# Patient Record
Sex: Male | Born: 1950 | Race: White | Hispanic: No | Marital: Married | Smoking: Never smoker
Health system: Southern US, Community
[De-identification: ages and names within clinical notes are randomized; demographics above are authoritative.]

## PROBLEM LIST (undated history)

## (undated) DIAGNOSIS — I4719 Other supraventricular tachycardia: Secondary | ICD-10-CM

## (undated) DIAGNOSIS — I447 Left bundle-branch block, unspecified: Secondary | ICD-10-CM

## (undated) DIAGNOSIS — E785 Hyperlipidemia, unspecified: Secondary | ICD-10-CM

## (undated) DIAGNOSIS — K5790 Diverticulosis of intestine, part unspecified, without perforation or abscess without bleeding: Secondary | ICD-10-CM

## (undated) DIAGNOSIS — F32A Depression, unspecified: Secondary | ICD-10-CM

## (undated) DIAGNOSIS — I2510 Atherosclerotic heart disease of native coronary artery without angina pectoris: Secondary | ICD-10-CM

## (undated) DIAGNOSIS — G629 Polyneuropathy, unspecified: Secondary | ICD-10-CM

## (undated) DIAGNOSIS — M25559 Pain in unspecified hip: Secondary | ICD-10-CM

## (undated) DIAGNOSIS — C4491 Basal cell carcinoma of skin, unspecified: Secondary | ICD-10-CM

## (undated) DIAGNOSIS — M1990 Unspecified osteoarthritis, unspecified site: Secondary | ICD-10-CM

## (undated) DIAGNOSIS — I10 Essential (primary) hypertension: Secondary | ICD-10-CM

## (undated) DIAGNOSIS — I219 Acute myocardial infarction, unspecified: Secondary | ICD-10-CM

## (undated) DIAGNOSIS — G4730 Sleep apnea, unspecified: Secondary | ICD-10-CM

## (undated) HISTORY — DX: Hyperlipidemia, unspecified: E78.5

## (undated) HISTORY — DX: Other supraventricular tachycardia: I47.19

## (undated) HISTORY — DX: Diverticulosis of intestine, part unspecified, without perforation or abscess without bleeding: K57.90

## (undated) HISTORY — DX: Basal cell carcinoma of skin, unspecified: C44.91

## (undated) HISTORY — DX: Pain in unspecified hip: M25.559

## (undated) HISTORY — PX: CHOLECYSTECTOMY: SHX55

## (undated) HISTORY — DX: Atherosclerotic heart disease of native coronary artery without angina pectoris: I25.10

## (undated) HISTORY — DX: Polyneuropathy, unspecified: G62.9

## (undated) HISTORY — PX: SLIPPED CAPITAL FEMORAL EPIPHYSIS PINNING: SHX391

## (undated) HISTORY — PX: OTHER SURGICAL HISTORY: SHX169

---

## 2002-04-22 ENCOUNTER — Encounter: Payer: Self-pay | Admitting: Emergency Medicine

## 2002-04-22 ENCOUNTER — Emergency Department (HOSPITAL_COMMUNITY): Admission: EM | Admit: 2002-04-22 | Discharge: 2002-04-22 | Payer: Self-pay | Admitting: Emergency Medicine

## 2002-04-27 ENCOUNTER — Encounter (INDEPENDENT_AMBULATORY_CARE_PROVIDER_SITE_OTHER): Payer: Self-pay | Admitting: *Deleted

## 2002-04-27 ENCOUNTER — Observation Stay (HOSPITAL_COMMUNITY): Admission: RE | Admit: 2002-04-27 | Discharge: 2002-04-28 | Payer: Self-pay | Admitting: *Deleted

## 2009-09-14 DIAGNOSIS — I2510 Atherosclerotic heart disease of native coronary artery without angina pectoris: Secondary | ICD-10-CM

## 2009-09-14 HISTORY — DX: Atherosclerotic heart disease of native coronary artery without angina pectoris: I25.10

## 2009-09-14 HISTORY — PX: CARDIAC CATHETERIZATION: SHX172

## 2009-09-27 ENCOUNTER — Ambulatory Visit: Payer: Self-pay | Admitting: Internal Medicine

## 2009-09-27 ENCOUNTER — Inpatient Hospital Stay (HOSPITAL_COMMUNITY): Admission: EM | Admit: 2009-09-27 | Discharge: 2009-09-28 | Payer: Self-pay | Admitting: Emergency Medicine

## 2009-10-06 ENCOUNTER — Ambulatory Visit (HOSPITAL_COMMUNITY): Admission: RE | Admit: 2009-10-06 | Discharge: 2009-10-07 | Payer: Self-pay | Admitting: Interventional Cardiology

## 2009-10-15 ENCOUNTER — Encounter (HOSPITAL_COMMUNITY)
Admission: RE | Admit: 2009-10-15 | Discharge: 2010-01-13 | Payer: Self-pay | Source: Home / Self Care | Attending: Interventional Cardiology | Admitting: Interventional Cardiology

## 2010-02-04 ENCOUNTER — Encounter: Payer: Self-pay | Admitting: Family Medicine

## 2010-03-29 LAB — CBC
HCT: 38.2 % — ABNORMAL LOW (ref 39.0–52.0)
Hemoglobin: 14.6 g/dL (ref 13.0–17.0)
MCHC: 34.7 g/dL (ref 30.0–36.0)
MCHC: 34.8 g/dL (ref 30.0–36.0)
Platelets: 200 10*3/uL (ref 150–400)
Platelets: 215 10*3/uL (ref 150–400)
RBC: 4.69 MIL/uL (ref 4.22–5.81)
RDW: 12.4 % (ref 11.5–15.5)
RDW: 12.7 % (ref 11.5–15.5)
WBC: 7.6 10*3/uL (ref 4.0–10.5)
WBC: 9 10*3/uL (ref 4.0–10.5)
WBC: 7.5 10*3/uL (ref 4.0–10.5)

## 2010-03-29 LAB — CARDIAC PANEL(CRET KIN+CKTOT+MB+TROPI)
CK, MB: 51.2 ng/mL (ref 0.3–4.0)
Total CK: 449 U/L — ABNORMAL HIGH (ref 7–232)
Total CK: 454 U/L — ABNORMAL HIGH (ref 7–232)
Troponin I: 7.78 ng/mL (ref 0.00–0.06)
Troponin I: 3.65 ng/mL (ref 0.00–0.06)
Troponin I: 7.75 ng/mL (ref 0.00–0.06)

## 2010-03-29 LAB — LIPID PANEL
Cholesterol: 197 mg/dL (ref 0–200)
HDL: 42 mg/dL (ref >39)
LDL Cholesterol: 99 mg/dL (ref 0–99)
Total CHOL/HDL Ratio: 4.7 RATIO
VLDL: 56 mg/dL — ABNORMAL HIGH (ref 0–40)

## 2010-03-29 LAB — POCT CARDIAC MARKERS
CKMB, poc: 4 ng/mL (ref 1.0–8.0)
Myoglobin, poc: 70.8 ng/mL (ref 12–200)
Troponin i, poc: 0.17 ng/mL — ABNORMAL HIGH (ref 0.00–0.09)

## 2010-03-29 LAB — BASIC METABOLIC PANEL
BUN: 10 mg/dL (ref 6–23)
BUN: 13 mg/dL (ref 6–23)
CO2: 26 mEq/L (ref 19–32)
Calcium: 8.8 mg/dL (ref 8.4–10.5)
Chloride: 106 mEq/L (ref 96–112)
Creatinine, Ser: 0.81 mg/dL (ref 0.4–1.5)
GFR calc non Af Amer: >60 mL/min (ref >60)
Glucose, Bld: 105 mg/dL — ABNORMAL HIGH (ref 70–99)
Glucose, Bld: 111 mg/dL — ABNORMAL HIGH (ref 70–99)
Potassium: 3.7 mEq/L (ref 3.5–5.1)
Potassium: 4.1 mEq/L (ref 3.5–5.1)
Sodium: 137 mEq/L (ref 135–145)

## 2010-03-29 LAB — POCT I-STAT, CHEM 8
BUN: 20 mg/dL (ref 6–23)
Chloride: 105 mEq/L (ref 96–112)
Creatinine, Ser: 0.8 mg/dL (ref 0.4–1.5)
Hemoglobin: 15 g/dL (ref 13.0–17.0)
Potassium: 3.9 mEq/L (ref 3.5–5.1)
Sodium: 139 mEq/L (ref 135–145)

## 2010-03-29 LAB — DIFFERENTIAL
Lymphocytes Relative: 46 % (ref 12–46)
Lymphs Abs: 3.4 10*3/uL (ref 0.7–4.0)
Monocytes Relative: 10 % (ref 3–12)
Neutro Abs: 3.1 10*3/uL (ref 1.7–7.7)
Neutrophils Relative %: 42 % — ABNORMAL LOW (ref 43–77)

## 2010-03-29 LAB — MRSA PCR SCREENING: MRSA by PCR: NEGATIVE

## 2010-03-29 LAB — HEMOGLOBIN A1C: Hgb A1c MFr Bld: 5.6 % (ref <5.7)

## 2010-06-01 NOTE — Op Note (Signed)
   NAME:  Wayne [name], Wayne [name]                            ACCOUNT NO.:  [id number]   MEDICAL RECORD NO.:  [id number]                   PATIENT TYPE:  OBV   LOCATION:  0356                                 FACILITY:  [facility]   PHYSICIAN:  Vikki Ports, M.D.         DATE OF BIRTH:  [birth year]   DATE OF PROCEDURE:  04/27/2002  DATE OF DISCHARGE:                                 OPERATIVE REPORT   PREOPERATIVE DIAGNOSIS:  Symptomatic cholelithiasis.   POSTOPERATIVE DIAGNOSIS:  Symptomatic cholelithiasis.   PROCEDURE:  Laparoscopic cholecystectomy.   SURGEON:  Vikki Ports, M.D.   ASSISTANT:  Anselm Pancoast. Zachery Dakins, M.D.   ANESTHESIA:  General.   DESCRIPTION OF PROCEDURE:  The patient was taken to the operating room and  placed in the supine position.  After adequate general anesthesia was  induced using endotracheal tube, the abdomen was prepped and draped in  normal sterile fashion.   Using a transverse infraumbilical incision, I dissected down to the fascia.  The fascia was opened vertically.  An 0 Vicryl pursestring suture was placed  around the fascial defect and Hasson trocar was placed in the abdomen.  The  abdomen was insufflated with continuous flow carbon dioxide.  Under direct  visualization a 7 mm port was placed in the subxiphoid region.  Two 5 mm  ports were placed in the right abdomen.  The gallbladder was somewhat  edematous and retracted superiorly.  Dissection of the infundibulum easily  identified the cystic duct, and this junction with the gallbladder.  An  adequate window was created posterior to this, giving excellent  visualization of cystic artery and duct.  They were both triply clipped and  divided.  The gallbladder was taken off the gallbladder bed using Bovie  electrocautery, and removed through the umbilical port.  Adequate hemostasis  was ensured.  Pneumoperitoneum was released.  The infraumbilical fascial  defect was closed with an 0 Vicryl  pursestring suture.  Skin incisions were  closed with subcuticular 4-0 Monocryl.  Steri-Strips and sterile dressings  were applied.  The patient tolerated the procedure well and went to PACU in  good condition.                                               Vikki Ports, M.D.    KRH/MEDQ  D:  04/27/2002  T:  04/27/2002  Job:  [id number]

## 2010-09-10 ENCOUNTER — Other Ambulatory Visit: Payer: Self-pay | Admitting: Dermatology

## 2012-05-28 ENCOUNTER — Other Ambulatory Visit: Payer: Self-pay | Admitting: Physical Medicine and Rehabilitation

## 2012-05-28 DIAGNOSIS — M545 Low back pain, unspecified: Secondary | ICD-10-CM

## 2012-06-03 ENCOUNTER — Other Ambulatory Visit: Payer: Self-pay

## 2012-11-18 ENCOUNTER — Encounter: Payer: Self-pay | Admitting: Interventional Cardiology

## 2013-01-13 ENCOUNTER — Other Ambulatory Visit: Payer: Self-pay | Admitting: Interventional Cardiology

## 2013-02-02 ENCOUNTER — Encounter: Payer: Self-pay | Admitting: Cardiology

## 2013-02-02 DIAGNOSIS — E782 Mixed hyperlipidemia: Secondary | ICD-10-CM | POA: Insufficient documentation

## 2013-02-02 DIAGNOSIS — I2510 Atherosclerotic heart disease of native coronary artery without angina pectoris: Secondary | ICD-10-CM | POA: Insufficient documentation

## 2013-02-02 DIAGNOSIS — I10 Essential (primary) hypertension: Secondary | ICD-10-CM

## 2013-02-02 DIAGNOSIS — G609 Hereditary and idiopathic neuropathy, unspecified: Secondary | ICD-10-CM | POA: Insufficient documentation

## 2013-02-02 DIAGNOSIS — I252 Old myocardial infarction: Secondary | ICD-10-CM | POA: Insufficient documentation

## 2013-02-15 ENCOUNTER — Encounter: Payer: Self-pay | Admitting: Interventional Cardiology

## 2013-02-15 ENCOUNTER — Ambulatory Visit (INDEPENDENT_AMBULATORY_CARE_PROVIDER_SITE_OTHER): Payer: BC Managed Care – PPO | Admitting: Interventional Cardiology

## 2013-02-15 VITALS — BP 114/74 | HR 60 | Ht 71.0 in | Wt 210.0 lb

## 2013-02-15 DIAGNOSIS — I2510 Atherosclerotic heart disease of native coronary artery without angina pectoris: Secondary | ICD-10-CM

## 2013-02-15 DIAGNOSIS — E782 Mixed hyperlipidemia: Secondary | ICD-10-CM

## 2013-02-15 DIAGNOSIS — I252 Old myocardial infarction: Secondary | ICD-10-CM

## 2013-02-15 DIAGNOSIS — I10 Essential (primary) hypertension: Secondary | ICD-10-CM

## 2013-02-15 NOTE — Patient Instructions (Signed)
Your physician recommends that you return for a FASTING lipid profile on 02/22/13.   Your physician recommends that you continue on your current medications as directed. Please refer to the Current Medication list given to you today.  Your physician wants you to follow-up in: 1 year follow up with Dr. Irish Lack. You will receive a reminder letter in the mail two months in advance. If you don't receive a letter, please call our office to schedule the follow-up appointment.

## 2013-02-15 NOTE — Progress Notes (Signed)
Patient ID: Wayne [name], male   DOB: [birth year], 63 y.o.   MRN: [id number]    [place], [name], [place]  [address] Phone: [phone] Fax:  [phone]  Date:  02/15/2013   ID:  Wayne [name], DOB September 19, 1950, MRN [id number]  PCP:  Simona Huh, MD      History of Present Illness: Wayne [name] is a 63 y.o. male who has multivessel CAD. He had stents in 2011. Infrequent chest pain like his prior angina/MI. There is typically back pain involved. He has gained some weight due to being unable to exercise. No exertional sx. Rides exercise bike without any chest pain. No significant chest pressure. Off CPAP for OSA, unable to tolerate. CAD/ASCVD:  c/o Chest pain shortlived; starts in his back and moves to the chest. Not related to exertion.. Chronic back pain. Denies : Dizziness.  Leg edema.  Nitroglycerin.  Orthopnea.  Palpitations.  Syncope.     Wt Readings from Last 3 Encounters:  02/15/13 210 lb (95.255 kg)     Past Medical History  Diagnosis Date  . Basal cell carcinoma     MOHS  . Hyperlipidemia   . Hip pain     CHRONIC  . Neuropathy     IDIOPATHIC  . Coronary artery disease 9/11    S/P MI and PTCA--X 4 STENTS  . Diverticulosis     Current Outpatient Prescriptions  Medication Sig Dispense Refill  . aspirin 325 MG tablet Take 325 mg by mouth daily.      . clopidogrel (PLAVIX) 75 MG tablet Take 75 mg by mouth daily with breakfast.      . DULoxetine (CYMBALTA) 60 MG capsule Take 60 mg by mouth daily.      [name] gabapentin (NEURONTIN) 600 MG tablet Take 600 mg by mouth 3 (three) times daily.      [name] lisinopril (PRINIVIL,ZESTRIL) 5 MG tablet Take 5 mg by mouth daily.      . metoprolol succinate (TOPROL-XL) 25 MG 24 hr tablet TAKE 1/2 TABLET BY MOUTY TWICE A DAY  30 tablet  3  . nitroGLYCERIN (NITROSTAT) 0.4 MG SL tablet Place 0.4 mg under the tongue every 5 (five) minutes as needed for chest pain.      [name] oxyCODONE-acetaminophen (PERCOCET) 10-325 MG per  tablet Take 1 tablet by mouth every 4 (four) hours as needed for pain.      . simvastatin (ZOCOR) 20 MG tablet Take 20 mg by mouth daily.       No current facility-administered medications for this visit.    Allergies:    Allergies  Allergen Reactions  . Mirapex [Pramipexole Dihydrochloride]     HA  . Trazodone And Nefazodone     DROWSY    Social History:  The patient  reports that he has never smoked. He does not have any smokeless tobacco history on file. He reports that he does not use illicit drugs.   Family History:  The patient's family history includes Cancer in his mother; Lung cancer in his father.   ROS:  Please see the history of present illness.  No nausea, vomiting.  No fevers, chills.  No focal weakness.  No dysuria.    All other systems reviewed and negative.   PHYSICAL EXAM: VS:  BP 114/74  Pulse 60  Ht 5\' 11"  (1.803 m)  Wt 210 lb (95.255 kg)  BMI 29.30 kg/m2 Well nourished, well developed, in no acute distress HEENT: normal Neck: no  JVD, no carotid bruits Cardiac:  normal S1, S2; RRR;  Lungs:  clear to auscultation bilaterally, no wheezing, rhonchi or rales Abd: soft, nontender, no hepatomegaly Ext: no edema Skin: warm and dry Neuro:   no focal abnormalities noted  EKG:    NSR, FAVB, no ST segment changes  ASSESSMENT AND PLAN:  Myocardial Infarction, Old  Continue Aspirin Tablet, 325 MG, 1 tablet, Orally, Once a day Continue Clopidogrel Bisulfate Tablet, 75 MG, 1 tablet, Orally, Once a day No signs of CHF.    2. Mixed hyperlipidemia  Continue Simvastatin Tablet, 20 MG, 1 tablet every evening, Orally, Once a day, 35, Refills 3 Well controlled. Add CoQ10 200 mg daily (Co Enzyme Q10) to help with muscle pain. LDL 73 in 2013.  6/14 : LDL 84, HDL 42 .  LDL target around 70.  Will recheck lipids.   3. Coronary atherosclerosis of native coronary artery  Diagnostic Imaging:EKG Harward,Amy 02/17/2012 09:55:41 AM > Shakyia Bosso,JAY 02/17/2012 10:10:45 AM > NSR,  poor R wave progression, EC Stress Test [facility] (Ordered for 02/17/2012) Harward,Amy 02/26/2012 08:38:52 AM > Pt notified.  Occasional sx similar to prior angina. Multivessel DES. Some anginal sx. Cannot walk treadmill due to hip pain. Stress test in 2014 was negative.     4. Hypertension, essential  Continue Lisinopril Tablet, 5 MG, 1 tablet, Orally, Once a day Continue Metoprolol Tartrate Tablet, 25 MG, 1/2 tab, Orally, Twice a day Controlled.      Signed, Mina Marble, MD, [facility] 02/15/2013 9:51 AM

## 2013-02-22 ENCOUNTER — Ambulatory Visit (INDEPENDENT_AMBULATORY_CARE_PROVIDER_SITE_OTHER): Payer: BC Managed Care – PPO

## 2013-02-22 DIAGNOSIS — E782 Mixed hyperlipidemia: Secondary | ICD-10-CM

## 2013-02-23 LAB — LDL CHOLESTEROL, DIRECT: LDL DIRECT: 76.8 mg/dL

## 2013-02-23 LAB — ALT: ALT: 22 U/L (ref 0–53)

## 2013-02-23 LAB — LIPID PANEL
Cholesterol: 160 mg/dL (ref 0–200)
HDL: 35.6 mg/dL — ABNORMAL LOW (ref >39.00)
Total CHOL/HDL Ratio: 4
Triglycerides: 312 mg/dL — ABNORMAL HIGH (ref 0.0–149.0)
VLDL: 62.4 mg/dL — ABNORMAL HIGH (ref 0.0–40.0)

## 2013-02-24 ENCOUNTER — Telehealth: Payer: Self-pay | Admitting: Interventional Cardiology

## 2013-02-24 NOTE — Telephone Encounter (Signed)
Waiting on Wayne [name] to review lipids.

## 2013-02-24 NOTE — Telephone Encounter (Signed)
New message          Pt lab results were high. Pt wants to know if you are still going to change his medication?

## 2013-02-25 ENCOUNTER — Telehealth: Payer: Self-pay | Admitting: Cardiology

## 2013-02-25 DIAGNOSIS — E782 Mixed hyperlipidemia: Secondary | ICD-10-CM

## 2013-02-25 MED ORDER — FISH OIL 1000 MG PO CAPS: 2000.0000 mg | ORAL_CAPSULE | Freq: Every day | ORAL | Status: AC

## 2013-02-25 MED ORDER — SIMVASTATIN 40 MG PO TABS: ORAL_TABLET | ORAL | Status: DC

## 2013-02-25 NOTE — Telephone Encounter (Signed)
Message copied by Alcario Drought on Thu Feb 25, 2013  1:48 PM ------      Message from: SMART, Maralyn Sago      Created: Wed Feb 24, 2013  6:23 PM       Patient with h/o multi-vessel CAD, and elevated non-HDL of 124 (goal < 100).  LDL also slightly elevated at 77 mg/dL, but this is likely masked due to his elevated TG.        LDL goal < 70, non-HDL goal < 100      Meds:  Simvastatin 20 mg qd.      ALT normal.      Plan:      1.  Increase simvastatin to 40 mg qd given elevated LDL and non-HDL.      2.  Add fish oil 1,000 mg  - take 2 daily.      3.  Limit breads, pastas, sugary drinks, and desserts.      4.  Recheck lipid panel and hepatic panel in 3 months.      Please notify patient, update meds, and set up labs. Thanks. ------

## 2013-02-25 NOTE — Telephone Encounter (Signed)
See result note, pt notified and meds updated.

## 2013-02-25 NOTE — Telephone Encounter (Signed)
Called pt, see result note 

## 2013-03-24 ENCOUNTER — Other Ambulatory Visit: Payer: Self-pay | Admitting: *Deleted

## 2013-03-24 MED ORDER — LISINOPRIL 5 MG PO TABS: 5.0000 mg | ORAL_TABLET | Freq: Every day | ORAL | Status: DC

## 2013-03-24 MED ORDER — CLOPIDOGREL BISULFATE 75 MG PO TABS: 75.0000 mg | ORAL_TABLET | Freq: Every day | ORAL | Status: DC

## 2013-05-05 ENCOUNTER — Other Ambulatory Visit: Payer: Self-pay | Admitting: Interventional Cardiology

## 2013-05-31 ENCOUNTER — Other Ambulatory Visit (INDEPENDENT_AMBULATORY_CARE_PROVIDER_SITE_OTHER): Payer: BC Managed Care – PPO

## 2013-05-31 DIAGNOSIS — E782 Mixed hyperlipidemia: Secondary | ICD-10-CM

## 2013-05-31 LAB — LIPID PANEL
CHOLESTEROL: 155 mg/dL (ref 0–200)
HDL: 40.3 mg/dL (ref >39.00)
LDL Cholesterol: 80 mg/dL (ref 0–99)
Total CHOL/HDL Ratio: 4
Triglycerides: 176 mg/dL — ABNORMAL HIGH (ref 0.0–149.0)
VLDL: 35.2 mg/dL (ref 0.0–40.0)

## 2013-05-31 LAB — HEPATIC FUNCTION PANEL
ALT: 19 U/L (ref 0–53)
AST: 25 U/L (ref 0–37)
Albumin: 4.1 g/dL (ref 3.5–5.2)
Alkaline Phosphatase: 56 U/L (ref 39–117)
Bilirubin, Direct: 0 mg/dL (ref 0.0–0.3)
TOTAL PROTEIN: 7.3 g/dL (ref 6.0–8.3)
Total Bilirubin: 1 mg/dL (ref 0.2–1.2)

## 2013-06-03 ENCOUNTER — Encounter: Payer: Self-pay | Admitting: Interventional Cardiology

## 2013-06-28 ENCOUNTER — Telehealth: Payer: Self-pay | Admitting: Cardiology

## 2013-06-28 DIAGNOSIS — E782 Mixed hyperlipidemia: Secondary | ICD-10-CM

## 2013-06-28 MED ORDER — ATORVASTATIN CALCIUM 80 MG PO TABS: 80.0000 mg | ORAL_TABLET | Freq: Every day | ORAL | Status: DC

## 2013-06-28 NOTE — Telephone Encounter (Signed)
Pt notified, meds updated and labs ordered.  

## 2013-06-28 NOTE — Telephone Encounter (Signed)
Message copied by Alcario Drought on Mon Jun 28, 2013  5:06 PM ------      Message from: SMART, Maralyn Sago      Created: Mon Jun 07, 2013 11:18 AM       Patient with h/o multi-vessel CAD - LDL goal < 70, non-HDL goal < 100      Meds:  Simvastatin 40 mg qd, fish oil 2 g/d..      Non-HDL was 124, and TG 312 just 3 months ago, and now non-HDL down to 115, and TG down to 176 mg/dL since increasing simvastatin and adding low dose fish oil.      LDL and non-HDL still slightly elevated.        Would recommend changing to a more potent statin given multi-vessel CAD and non-HDL and LDL still elevated.      Plan:      1.    D/C simvastatin 40 mg qd.      2.  Start atorvastatin 80 mg qd.      3. Continue fish oil 2 g/d.      4.  Recheck lipid panel and hepatic panel in 3 months.      Please notify patient, update meds, and set up labs. Thanks.       ------

## 2013-09-18 ENCOUNTER — Other Ambulatory Visit: Payer: Self-pay | Admitting: Interventional Cardiology

## 2013-11-03 ENCOUNTER — Other Ambulatory Visit: Payer: Self-pay | Admitting: Interventional Cardiology

## 2013-11-23 ENCOUNTER — Telehealth: Payer: Self-pay | Admitting: Interventional Cardiology

## 2013-11-23 DIAGNOSIS — E782 Mixed hyperlipidemia: Secondary | ICD-10-CM

## 2013-11-23 NOTE — Telephone Encounter (Signed)
Please order fasting lipid panel and a hepatic panel.

## 2013-11-25 NOTE — Addendum Note (Signed)
Addended by: Alvis Lemmings C on: 11/25/2013 09:00 AM   Modules accepted: Orders

## 2013-11-25 NOTE — Telephone Encounter (Signed)
Please contact him.  Statin was changed several months ago.

## 2013-11-25 NOTE — Telephone Encounter (Signed)
I left a message for the patient to call. 

## 2013-11-25 NOTE — Telephone Encounter (Signed)
Labs ordered- do I need to contact the patient to set this up for him. Sorry- not quite following why these are being ordered now?

## 2014-01-12 ENCOUNTER — Telehealth: Payer: Self-pay

## 2014-01-12 ENCOUNTER — Encounter: Payer: Self-pay | Admitting: Interventional Cardiology

## 2014-01-12 NOTE — Telephone Encounter (Signed)
We tried to call you. Please call our office to schedule an appointment. [phone]

## 2014-01-12 NOTE — Telephone Encounter (Signed)
Left message for patient to call back  

## 2014-01-12 NOTE — Telephone Encounter (Signed)
Patient called back. Patient wants to make an appointment with Dr. Irish Lack. Patient states, "I feel like I am more tired, it's that season. Sometimes my heart gets to racing. I need to find out if I need to come in sooner than my appointment." Patient does not have an appointment scheduled. Patient has a recall for February. Transferred patient to scheduler to make an appointment for next available with Dr. Irish Lack.

## 2014-01-23 ENCOUNTER — Other Ambulatory Visit: Payer: Self-pay | Admitting: Interventional Cardiology

## 2014-01-24 ENCOUNTER — Encounter: Payer: Self-pay | Admitting: Interventional Cardiology

## 2014-01-24 ENCOUNTER — Ambulatory Visit (INDEPENDENT_AMBULATORY_CARE_PROVIDER_SITE_OTHER): Payer: BLUE CROSS/BLUE SHIELD | Admitting: Interventional Cardiology

## 2014-01-24 ENCOUNTER — Encounter: Payer: Self-pay | Admitting: *Deleted

## 2014-01-24 ENCOUNTER — Encounter (INDEPENDENT_AMBULATORY_CARE_PROVIDER_SITE_OTHER): Payer: BLUE CROSS/BLUE SHIELD

## 2014-01-24 VITALS — BP 130/80 | HR 58 | Ht 71.0 in | Wt 219.0 lb

## 2014-01-24 DIAGNOSIS — R002 Palpitations: Secondary | ICD-10-CM

## 2014-01-24 DIAGNOSIS — I10 Essential (primary) hypertension: Secondary | ICD-10-CM

## 2014-01-24 DIAGNOSIS — I252 Old myocardial infarction: Secondary | ICD-10-CM

## 2014-01-24 DIAGNOSIS — I2510 Atherosclerotic heart disease of native coronary artery without angina pectoris: Secondary | ICD-10-CM

## 2014-01-24 DIAGNOSIS — E782 Mixed hyperlipidemia: Secondary | ICD-10-CM

## 2014-01-24 LAB — LIPID PANEL
CHOL/HDL RATIO: 4
Cholesterol: 129 mg/dL (ref 0–200)
HDL: 32.6 mg/dL — AB (ref >39.00)
LDL Cholesterol: 61 mg/dL (ref 0–99)
NonHDL: 96.4
Triglycerides: 176 mg/dL — ABNORMAL HIGH (ref 0.0–149.0)
VLDL: 35.2 mg/dL (ref 0.0–40.0)

## 2014-01-24 LAB — HEPATIC FUNCTION PANEL
ALT: 22 U/L (ref 0–53)
AST: 24 U/L (ref 0–37)
Albumin: 4 g/dL (ref 3.5–5.2)
Alkaline Phosphatase: 75 U/L (ref 39–117)
Bilirubin, Direct: 0 mg/dL (ref 0.0–0.3)
Total Bilirubin: 0.5 mg/dL (ref 0.2–1.2)
Total Protein: 7.4 g/dL (ref 6.0–8.3)

## 2014-01-24 MED ORDER — ASPIRIN EC 81 MG PO TBEC: 81.0000 mg | DELAYED_RELEASE_TABLET | Freq: Every day | ORAL | Status: DC

## 2014-01-24 NOTE — Progress Notes (Signed)
Patient ID: Wayne [name], male   DOB: [birth year], 64 y.o.   MRN: [id number]      [place], [place], [place]  [address] Phone: [phone] Fax:  [phone]  Date:  01/24/2014   ID:  Wayne [name], DOB [birth year], MRN [id number]  PCP:  Wayne Huh, MD      History of Present Illness: Wayne [name] is a 64 y.o. male who has multivessel CAD. He had stents in 2011. Infrequent chest pain unlike his prior angina/MI in severity. There is typically back pain involved.  No exertional sx. Rides exercise bike and lifts weight without any chest pain. This does not limit his activities.  They occur nearly daily.  It is not at all like the heart attack. No significant chest pressure. Off CPAP for OSA, unable to tolerate.  He has given up on the CPAP at this point. CAD/ASCVD:  c/o Chest pain shortlived- more like a pressure, usually at night; starts in his back and moves to the chest. Not related to exertion.. Chronic back pain. Denies : Dizziness.  Leg edema.  Nitroglycerin.  Orthopnea.   Syncope.   He has had palpitations, about once a week.  Feels like heart racing, can last up to a minute.  There is associated shortness of breath.  This is a difference over the past few months.  Does not limit activity.     Wt Readings from Last 3 Encounters:  01/24/14 219 lb (99.338 kg)  02/15/13 210 lb (95.255 kg)     Past Medical History  Diagnosis Date  . Basal cell carcinoma     MOHS  . Hyperlipidemia   . Hip pain     CHRONIC  . Neuropathy     IDIOPATHIC  . Coronary artery disease 9/11    S/P MI and PTCA--X 4 STENTS  . Diverticulosis     Current Outpatient Prescriptions  Medication Sig Dispense Refill  . aspirin 325 MG tablet Take 325 mg by mouth daily.    [name] atorvastatin (LIPITOR) 80 MG tablet Take 1 tablet (80 mg total) by mouth daily. 30 tablet 6  . clopidogrel (PLAVIX) 75 MG tablet TAKE 1 TABLET (75 MG TOTAL) BY MOUTH DAILY WITH BREAKFAST. 30 tablet 10  . DULoxetine  (CYMBALTA) 60 MG capsule Take 60 mg by mouth daily.    [name] gabapentin (NEURONTIN) 600 MG tablet Take 600 mg by mouth 3 (three) times daily.    [name] lisinopril (PRINIVIL,ZESTRIL) 5 MG tablet TAKE 1 TABLET (5 MG TOTAL) BY MOUTH DAILY. 30 tablet 10  . metoprolol succinate (TOPROL-XL) 25 MG 24 hr tablet TAKE 1/2 TABLET BY MOUTH TWICE A DAY 30 tablet 5  . Omega-3 Fatty Acids (FISH OIL) 1000 MG CAPS Take 2 capsules (2,000 mg total) by mouth daily.  0  . zolpidem (AMBIEN) 5 MG tablet Take 5 mg by mouth at bedtime.  3  . nitroGLYCERIN (NITROSTAT) 0.4 MG SL tablet Place 0.4 mg under the tongue every 5 (five) minutes as needed for chest pain.    [name] oxyCODONE-acetaminophen (PERCOCET) 10-325 MG per tablet Take 1 tablet by mouth every 4 (four) hours as needed for pain.     No current facility-administered medications for this visit.    Allergies:    Allergies  Allergen Reactions  . Mirapex [Pramipexole Dihydrochloride]     HA  . Trazodone And Nefazodone     DROWSY    Social History:  The patient  reports that he  has never smoked. He does not have any smokeless tobacco history on file. He reports that he does not use illicit drugs.   Family History:  The patient's family history includes Cancer in his mother; Lung cancer in his father.   ROS:  Please see the history of present illness.  No nausea, vomiting.  No fevers, chills.  No focal weakness.  No dysuria.    All other systems reviewed and negative.   PHYSICAL EXAM: VS:  BP 130/80 mmHg  Pulse 58  Ht 5\' 11"  (1.803 m)  Wt 219 lb (99.338 kg)  BMI 30.56 kg/m2 Well nourished, well developed, in no acute distress HEENT: normal Neck: no JVD, no carotid bruits Cardiac:  normal S1, S2; RRR;  Lungs:  clear to auscultation bilaterally, no wheezing, rhonchi or rales Abd: soft, nontender, no hepatomegaly Ext: no edema Skin: warm and dry Neuro:   no focal abnormalities noted Psych: normal affect  EKG:    NSR, FAVB, no ST segment changes,  PVC  ASSESSMENT AND PLAN:  Myocardial Infarction, Old  Decrease Aspirin Tablet, 81 MG, 1 tablet, Orally, Once a day Continue Clopidogrel Bisulfate Tablet, 75 MG, 1 tablet, Orally, Once a day No signs of CHF. EF normal on prior stress test.   2. Mixed hyperlipidemia  Now on  atorvastatin Tablet, 80 MG, 1 tablet every evening, Orally, Once a day, 90, Refills 3 TG increased. Tried CoQ10 200 mg daily (Co Enzyme Q10) to help with muscle pain in the past. LDL 73 in 2013.  6/14 : LDL 84, HDL 42 .  LDL target around 70.  Will recheck lipids.   3. Coronary atherosclerosis of native coronary artery    Multivessel DES.  Cannot walk treadmill due to hip pain. Stress test in 2014 was negative. Chest pain  Symptoms are similar to what he had last year.  Nonexertional. Not exactly like his prior MI. Not limiting his activity. We'll reassess in 2 months. If symptoms persist, consider further ischemia workup. We did briefly discussed cardiac cath and stress testing today. He declined As he did not feel that his symptoms were bad enough.    4. Hypertension, essential  Continue Lisinopril Tablet, 5 MG, 1 tablet, Orally, Once a day Continue Metoprolol Tartrate Tablet, 25 MG, 1/2 tab, Orally, Twice a day Controlled.    5. Palpitations: He did have a PVC noted on his ECG. He did not feel palpitations at that time. Symptoms are short-lived. Will check 30 day event monitor to rule out significant arrhythmia. No syncope or presyncope reported.  Signed, Mina Marble, MD, [facility] 01/24/2014 8:53 AM

## 2014-01-24 NOTE — Progress Notes (Signed)
Patient ID: Wayne [name], male   DOB: [birth year], 64 y.o.   MRN: [id number] [employer] 30 day cardiac event monitor applied to patient.

## 2014-01-24 NOTE — Patient Instructions (Signed)
Your physician has recommended you make the following change in your medication:  1) STOP taking Aspirin 325mg  2) START taking Aspirin 81mg  daily  Your physician recommends that you return for lab work today. (Fasting Lipids and LFTs)  Your physician has recommended that you wear an event monitor. Event monitors are medical devices that record the heart's electrical activity. Doctors most often [place] these monitors to diagnose arrhythmias. Arrhythmias are problems with the speed or rhythm of the heartbeat. The monitor is a small, portable device. You can wear one while you do your normal daily activities. This is usually used to diagnose what is causing palpitations/syncope (passing out).  Your physician recommends that you schedule a follow-up appointment in: 2 months with Dr. Irish Lack.

## 2014-01-27 ENCOUNTER — Telehealth: Payer: Self-pay | Admitting: *Deleted

## 2014-01-27 DIAGNOSIS — E782 Mixed hyperlipidemia: Secondary | ICD-10-CM

## 2014-01-27 NOTE — Telephone Encounter (Signed)
Spoke with pt and made him aware of his lab results and informed him that he would need repeat labs in 3 months.  Scheduled lab appt for 04/25/14. Pt verbalized understanding of orders.

## 2014-01-27 NOTE — Telephone Encounter (Signed)
-----   Message from Aris Georgia, [facility] sent at 01/25/2014 12:59 PM EST ----- Patient with h/o multi-vessel CAD - LDL goal < 70, non-HDL goal < 100 Meds: atorvastatin 80mg , fish oil 2 g/d.[name] LDL improved since changing from simvastatin to atorvastatin.  TG still elevated but stable.  Non-HDL at goal of <100 mg/dL.  1. Continue atorvastatin 80 mg qd. 2. Continue fish oil 2 g/d. 3. Recheck lipid panel and hepatic panel in 3 months. Please notify patient, update meds, and set up labs. Thanks.

## 2014-02-07 ENCOUNTER — Encounter: Payer: Self-pay | Admitting: Interventional Cardiology

## 2014-02-07 ENCOUNTER — Telehealth: Payer: Self-pay | Admitting: *Deleted

## 2014-02-07 NOTE — Telephone Encounter (Signed)
Left message to please call.

## 2014-02-10 ENCOUNTER — Other Ambulatory Visit: Payer: Self-pay | Admitting: Interventional Cardiology

## 2014-03-28 ENCOUNTER — Encounter: Payer: Self-pay | Admitting: Interventional Cardiology

## 2014-03-28 ENCOUNTER — Ambulatory Visit (INDEPENDENT_AMBULATORY_CARE_PROVIDER_SITE_OTHER): Payer: BLUE CROSS/BLUE SHIELD | Admitting: Interventional Cardiology

## 2014-03-28 VITALS — BP 134/76 | HR 81 | Ht 71.0 in | Wt 221.0 lb

## 2014-03-28 DIAGNOSIS — I252 Old myocardial infarction: Secondary | ICD-10-CM

## 2014-03-28 DIAGNOSIS — I2510 Atherosclerotic heart disease of native coronary artery without angina pectoris: Secondary | ICD-10-CM

## 2014-03-28 DIAGNOSIS — E782 Mixed hyperlipidemia: Secondary | ICD-10-CM

## 2014-03-28 MED ORDER — NITROGLYCERIN 0.4 MG SL SUBL: 0.4000 mg | SUBLINGUAL_TABLET | SUBLINGUAL | Status: DC | PRN

## 2014-03-28 NOTE — Progress Notes (Signed)
Patient ID: Wayne [name], male   DOB: 05-Mar-1950, 64 y.o.   MRN: [id number] Patient ID: Wayne [name], male   DOB: 07/19/1950, 64 y.o.   MRN: [id number]      [place], [place], [place]  [address] Phone: [phone] Fax:  [phone]  Date:  03/28/2014   ID:  Wayne [name], DOB November 23, 1950, MRN [id number]  PCP:  Simona Huh, MD      History of Present Illness: Wayne [name] is a 64 y.o. male who has multivessel CAD. He had stents in 2011. Infrequent chest pain unlike his prior angina/MI in severity. There is typically back pain involved.  No exertional sx. Rides exercise bike and lifts weight without any chest pain. This does not limit his activities.  They occur nearly daily.  It is not at all like the heart attack. No significant chest pressure. Off CPAP for OSA, unable to tolerate.  He has given up on the CPAP at this point. CAD/ASCVD:  c/o Chest pain as above, usually at night; starts in his back and moves to the chest. Not related to exertion.. Chronic back pain. Denies : Dizziness.  Leg edema.  Nitroglycerin.  Orthopnea.   Syncope.   No more palpitations.  Does not limit activity.     Wt Readings from Last 3 Encounters:  03/28/14 221 lb (100.245 kg)  01/24/14 219 lb (99.338 kg)  02/15/13 210 lb (95.255 kg)     Past Medical History  Diagnosis Date  . Basal cell carcinoma     MOHS  . Hyperlipidemia   . Hip pain     CHRONIC  . Neuropathy     IDIOPATHIC  . Coronary artery disease 9/11    S/P MI and PTCA--X 4 STENTS  . Diverticulosis     Current Outpatient Prescriptions  Medication Sig Dispense Refill  . aspirin EC 81 MG tablet Take 1 tablet (81 mg total) by mouth daily.    [name] atorvastatin (LIPITOR) 80 MG tablet TAKE 1 TABLET (80 MG TOTAL) BY MOUTH DAILY. 30 tablet 3  . clopidogrel (PLAVIX) 75 MG tablet TAKE 1 TABLET (75 MG TOTAL) BY MOUTH DAILY WITH BREAKFAST. 30 tablet 3  . DULoxetine (CYMBALTA) 60 MG capsule Take 60 mg by mouth daily.    [name]  gabapentin (NEURONTIN) 600 MG tablet Take 600 mg by mouth 3 (three) times daily.    [name] lisinopril (PRINIVIL,ZESTRIL) 5 MG tablet TAKE 1 TABLET (5 MG TOTAL) BY MOUTH DAILY. 30 tablet 3  . metoprolol succinate (TOPROL-XL) 25 MG 24 hr tablet TAKE 1/2 TABLET BY MOUTH TWICE A DAY 30 tablet 3  . nitroGLYCERIN (NITROSTAT) 0.4 MG SL tablet Place 0.4 mg under the tongue every 5 (five) minutes as needed for chest pain.    . Omega-3 Fatty Acids (FISH OIL) 1000 MG CAPS Take 2 capsules (2,000 mg total) by mouth daily.  0  . oxyCODONE-acetaminophen (PERCOCET) 10-325 MG per tablet Take 1 tablet by mouth every 4 (four) hours as needed for pain.    [name] zolpidem (AMBIEN) 5 MG tablet Take 5 mg by mouth at bedtime.  3   No current facility-administered medications for this visit.    Allergies:    Allergies  Allergen Reactions  . Mirapex [Pramipexole Dihydrochloride] Other (See Comments)    Patient does not remember   . Trazodone And Nefazodone Other (See Comments)    DROWSY    Social History:  The patient  reports that he has never  smoked. He does not have any smokeless tobacco history on file. He reports that he does not use illicit drugs.   Family History:  The patient's family history includes Cancer in his mother; Lung cancer in his father.   ROS:  Please see the history of present illness.  No nausea, vomiting.  No fevers, chills.  No focal weakness.  No dysuria.    All other systems reviewed and negative.   PHYSICAL EXAM: VS:  BP 134/76 mmHg  Pulse 81  Ht 5\' 11"  (1.803 m)  Wt 221 lb (100.245 kg)  BMI 30.84 kg/m2 Well nourished, well developed, in no acute distress HEENT: normal Neck: no JVD, no carotid bruits Cardiac:  normal S1, S2; RRR;  Lungs:  clear to auscultation bilaterally, no wheezing, rhonchi or rales Abd: soft, nontender, no hepatomegaly Ext: no edema Skin: warm and dry Neuro:   no focal abnormalities noted Psych: normal affect  EKG:    NSR, FAVB, no ST segment changes,  PVC  ASSESSMENT AND PLAN:  Myocardial Infarction, Old  Decreased Aspirin Tablet, 81 MG, 1 tablet, Orally, Once a day Continue Clopidogrel Bisulfate Tablet, 75 MG, 1 tablet, Orally, Once a day No signs of CHF. EF normal on prior stress test in 2014. Refill SL NTG.   2. Mixed hyperlipidemia  Now on  atorvastatin Tablet, 80 MG, 1 tablet every evening, Orally, Once a day, 90, Refills 3 TG increased. Tried CoQ10 200 mg daily (Co Enzyme Q10) to help with muscle pain in the past. LDL 73 in 2013.  6/14 : LDL 84, HDL 42 .  LDL 61.  TG elevated.  Should improve with liestyle changes.   3. Coronary atherosclerosis of native coronary artery    Multivessel DES.  Cannot walk treadmill due to hip pain. Uses exercise bike. Chemical Stress test in 2014 was negative.   No exertional sx.  No further testing at this time.  He will let us know if he has sx.   4. Hypertension, essential  Continue Lisinopril Tablet, 5 MG, 1 tablet, Orally, Once a day Continue Metoprolol Tartrate Tablet, 25 MG, 1/2 tab, Orally, Twice a day Controlled.    5. Palpitations: He did have PVCs on his monitor in Jan 2016. No sustained arrhythmias.  No correlating sx.  Symptoms are short-lived. Will check 30 day event monitor to rule out significant arrhythmia. No syncope or presyncope reported.  Signed, Mina Marble, MD, [facility] 03/28/2014 8:27 AM

## 2014-03-28 NOTE — Patient Instructions (Signed)
Your physician recommends that you continue on your current medications as directed. Please refer to the Current Medication list given to you today.  Your physician wants you to follow-up in: 1 year with Dr. Varanasi. You will receive a reminder letter in the mail two months in advance. If you don't receive a letter, please call our office to schedule the follow-up appointment.  

## 2014-04-19 ENCOUNTER — Other Ambulatory Visit: Payer: Self-pay | Admitting: Interventional Cardiology

## 2014-04-25 ENCOUNTER — Other Ambulatory Visit (INDEPENDENT_AMBULATORY_CARE_PROVIDER_SITE_OTHER): Payer: BLUE CROSS/BLUE SHIELD | Admitting: *Deleted

## 2014-04-25 ENCOUNTER — Encounter: Payer: Self-pay | Admitting: Interventional Cardiology

## 2014-04-25 DIAGNOSIS — E782 Mixed hyperlipidemia: Secondary | ICD-10-CM

## 2014-04-25 LAB — LIPID PANEL
CHOLESTEROL: 135 mg/dL (ref 0–200)
HDL: 35.1 mg/dL — ABNORMAL LOW (ref >39.00)
LDL Cholesterol: 61 mg/dL (ref 0–99)
NonHDL: 99.9
TRIGLYCERIDES: 194 mg/dL — AB (ref 0.0–149.0)
Total CHOL/HDL Ratio: 4
VLDL: 38.8 mg/dL (ref 0.0–40.0)

## 2014-04-25 LAB — HEPATIC FUNCTION PANEL
ALBUMIN: 3.9 g/dL (ref 3.5–5.2)
ALK PHOS: 77 U/L (ref 39–117)
ALT: 17 U/L (ref 0–53)
AST: 17 U/L (ref 0–37)
Bilirubin, Direct: 0.1 mg/dL (ref 0.0–0.3)
Total Bilirubin: 0.6 mg/dL (ref 0.2–1.2)
Total Protein: 7.1 g/dL (ref 6.0–8.3)

## 2014-04-26 ENCOUNTER — Telehealth: Payer: Self-pay | Admitting: *Deleted

## 2014-04-26 DIAGNOSIS — E782 Mixed hyperlipidemia: Secondary | ICD-10-CM

## 2014-04-26 NOTE — Telephone Encounter (Signed)
Made pt aware of lab results and new orders. Lab appt made for 07/25/14. Pt verbalized understanding and was in agreement with this plan.

## 2014-04-26 NOTE — Telephone Encounter (Signed)
-----   Message from Jettie Booze, MD sent at 04/25/2014  2:15 PM EDT ----- TG still elevated. Increase fish oil to 4 grams /day.   Recheck lipids in 3 months.  Increase exercise and try to reduce fatty food intake.  Can try [employer] brand fish oil.  We have had the best luck with that one for TG reduction.

## 2014-07-25 ENCOUNTER — Other Ambulatory Visit (INDEPENDENT_AMBULATORY_CARE_PROVIDER_SITE_OTHER): Payer: BLUE CROSS/BLUE SHIELD | Admitting: *Deleted

## 2014-07-25 DIAGNOSIS — E782 Mixed hyperlipidemia: Secondary | ICD-10-CM

## 2014-07-25 LAB — LIPID PANEL
CHOLESTEROL: 128 mg/dL (ref 0–200)
HDL: 38.7 mg/dL — AB (ref >39.00)
LDL Cholesterol: 71 mg/dL (ref 0–99)
NONHDL: 89.3
TRIGLYCERIDES: 94 mg/dL (ref 0.0–149.0)
Total CHOL/HDL Ratio: 3
VLDL: 18.8 mg/dL (ref 0.0–40.0)

## 2014-07-25 NOTE — Addendum Note (Signed)
Addended by: Eulis Foster on: 07/25/2014 08:06 AM   Modules accepted: Orders

## 2014-08-01 ENCOUNTER — Encounter: Payer: Self-pay | Admitting: Interventional Cardiology

## 2014-08-02 ENCOUNTER — Telehealth: Payer: Self-pay | Admitting: *Deleted

## 2014-08-02 MED ORDER — ROSUVASTATIN CALCIUM 40 MG PO TABS: 40.0000 mg | ORAL_TABLET | Freq: Every day | ORAL | Status: DC

## 2014-08-02 NOTE — Telephone Encounter (Signed)
From: [name]      Sent: 08/01/2014  5:45 PM      To: [name]    Subject: RE: Non-Urgent Medical Question               Yes but not at first,    ----- Message -----    From: Nurse Hennie Duos    Sent: 08/01/2014 2:27 PM EDT    To: [name]    Subject: RE: Non-Urgent Medical Question        [name],        It is true that statin medications can cause muscle aches/pains. That is an unfortunate side effect of those type of medications. Did these leg pains start occuring after starting the 80mg  Atorvastatin?        Maude Leriche, LPN        ----- Message -----     From: [name]     Sent: 08/01/2014 2:19 PM EDT      To: Jettie Booze., MD    Subject: Non-Urgent Medical Question        I been having extreme leg pain, I have bad hips but the pain is both legs like muscle pain. I have been told that statins can cause this. Is this true? My numbers are pretty good so I hate to change meds but what do you think? Thank you Wayne [name]    Will forward to Dr. Irish Lack for review and advisement.

## 2014-08-02 NOTE — Telephone Encounter (Signed)
Could try rosuvastatin 40 mg daily. THis may be less likely to cause muscle pains.

## 2014-08-02 NOTE — Telephone Encounter (Signed)
Spoke with pt and informed him that Dr. Irish Lack said we could try Rosuvastatin 40mg . Verified pharmacy.  [place]. Pt in agreement with plan to switch medications.

## 2014-08-23 ENCOUNTER — Encounter (INDEPENDENT_AMBULATORY_CARE_PROVIDER_SITE_OTHER): Payer: BLUE CROSS/BLUE SHIELD | Admitting: Ophthalmology

## 2014-08-23 DIAGNOSIS — D3131 Benign neoplasm of right choroid: Secondary | ICD-10-CM

## 2014-08-23 DIAGNOSIS — I10 Essential (primary) hypertension: Secondary | ICD-10-CM

## 2014-08-23 DIAGNOSIS — H4311 Vitreous hemorrhage, right eye: Secondary | ICD-10-CM

## 2014-08-23 DIAGNOSIS — H33301 Unspecified retinal break, right eye: Secondary | ICD-10-CM | POA: Diagnosis not present

## 2014-08-23 DIAGNOSIS — H43813 Vitreous degeneration, bilateral: Secondary | ICD-10-CM | POA: Diagnosis not present

## 2014-08-23 DIAGNOSIS — H35033 Hypertensive retinopathy, bilateral: Secondary | ICD-10-CM | POA: Diagnosis not present

## 2014-08-25 ENCOUNTER — Encounter: Payer: Self-pay | Admitting: Interventional Cardiology

## 2014-08-25 ENCOUNTER — Telehealth: Payer: Self-pay | Admitting: *Deleted

## 2014-08-25 NOTE — Telephone Encounter (Signed)
error 

## 2014-09-05 ENCOUNTER — Ambulatory Visit (INDEPENDENT_AMBULATORY_CARE_PROVIDER_SITE_OTHER): Payer: BLUE CROSS/BLUE SHIELD | Admitting: Ophthalmology

## 2014-09-05 DIAGNOSIS — H33301 Unspecified retinal break, right eye: Secondary | ICD-10-CM

## 2014-12-14 ENCOUNTER — Other Ambulatory Visit: Payer: Self-pay | Admitting: Interventional Cardiology

## 2015-01-23 ENCOUNTER — Encounter: Payer: Self-pay | Admitting: Interventional Cardiology

## 2015-01-23 ENCOUNTER — Ambulatory Visit (INDEPENDENT_AMBULATORY_CARE_PROVIDER_SITE_OTHER): Payer: BLUE CROSS/BLUE SHIELD | Admitting: Ophthalmology

## 2015-02-20 ENCOUNTER — Ambulatory Visit (INDEPENDENT_AMBULATORY_CARE_PROVIDER_SITE_OTHER): Payer: BLUE CROSS/BLUE SHIELD | Admitting: Ophthalmology

## 2015-02-20 DIAGNOSIS — H43813 Vitreous degeneration, bilateral: Secondary | ICD-10-CM

## 2015-02-20 DIAGNOSIS — D3131 Benign neoplasm of right choroid: Secondary | ICD-10-CM | POA: Diagnosis not present

## 2015-02-20 DIAGNOSIS — H2513 Age-related nuclear cataract, bilateral: Secondary | ICD-10-CM

## 2015-02-20 DIAGNOSIS — I10 Essential (primary) hypertension: Secondary | ICD-10-CM | POA: Diagnosis not present

## 2015-02-20 DIAGNOSIS — H35033 Hypertensive retinopathy, bilateral: Secondary | ICD-10-CM | POA: Diagnosis not present

## 2015-02-20 DIAGNOSIS — H33303 Unspecified retinal break, bilateral: Secondary | ICD-10-CM

## 2015-02-23 ENCOUNTER — Encounter: Payer: Self-pay | Admitting: Interventional Cardiology

## 2015-02-24 ENCOUNTER — Other Ambulatory Visit: Payer: Self-pay | Admitting: *Deleted

## 2015-02-24 MED ORDER — CLOPIDOGREL BISULFATE 75 MG PO TABS: ORAL_TABLET | ORAL | Status: DC

## 2015-02-28 ENCOUNTER — Ambulatory Visit (INDEPENDENT_AMBULATORY_CARE_PROVIDER_SITE_OTHER): Payer: BLUE CROSS/BLUE SHIELD | Admitting: Ophthalmology

## 2015-02-28 DIAGNOSIS — H33302 Unspecified retinal break, left eye: Secondary | ICD-10-CM

## 2015-04-08 ENCOUNTER — Other Ambulatory Visit: Payer: Self-pay | Admitting: Interventional Cardiology

## 2015-05-03 ENCOUNTER — Other Ambulatory Visit: Payer: Self-pay | Admitting: Interventional Cardiology

## 2015-05-03 ENCOUNTER — Telehealth: Payer: Self-pay | Admitting: Interventional Cardiology

## 2015-05-03 NOTE — Telephone Encounter (Signed)
Request for surgical clearance:  1. What type of surgery is being performed? Colonoscopy   2. When is this surgery scheduled? 05-08-15-   3. Are there any medications that need to be held prior to surgery and how long?Want to know about stopping his Plavix  4. Name of physician performing surgery? Dr Lucienne Capers   5. What is your office phone and fax number? [phone] and [phone]  6.

## 2015-05-04 NOTE — Telephone Encounter (Signed)
OK to hold plavix 5 days prior to colonoscopy.  

## 2015-05-04 NOTE — Telephone Encounter (Signed)
WILL SEND  TO  DR  Irish Lack FOR  REVIEW./CY

## 2015-05-04 NOTE — Telephone Encounter (Signed)
Attempted to call pt.  Left message on home and cell phone to call back.    Called Dr. Shary Key office and was directed to nurse voicemail line as they have left the office for the day.  Left message that I was sending over fax and to call with any questions.

## 2015-05-04 NOTE — Telephone Encounter (Signed)
Follow Up:   Calling back again today,she says she must know today if pt is cleared. Surgery is scheduled Monday and he would need to stop his Plavix today.Please fax today to [phone] Att: Ria Comment

## 2015-05-05 NOTE — Telephone Encounter (Signed)
Left message on pt home voicemail to hold Plavix for 5 days prior to colonoscopy.  Left message on nurse line VM of [place] to hold and to call back if any questions.

## 2015-05-05 NOTE — Telephone Encounter (Signed)
Pt returned call please give him a call back.

## 2015-05-09 ENCOUNTER — Other Ambulatory Visit: Payer: Self-pay | Admitting: Interventional Cardiology

## 2015-05-10 ENCOUNTER — Other Ambulatory Visit: Payer: Self-pay | Admitting: Interventional Cardiology

## 2015-06-06 ENCOUNTER — Other Ambulatory Visit: Payer: Self-pay | Admitting: Interventional Cardiology

## 2015-06-07 ENCOUNTER — Other Ambulatory Visit: Payer: Self-pay | Admitting: Interventional Cardiology

## 2015-06-17 ENCOUNTER — Other Ambulatory Visit: Payer: Self-pay | Admitting: Interventional Cardiology

## 2015-06-22 ENCOUNTER — Other Ambulatory Visit: Payer: Self-pay | Admitting: Interventional Cardiology

## 2015-06-30 ENCOUNTER — Other Ambulatory Visit: Payer: Self-pay | Admitting: Interventional Cardiology

## 2015-07-03 ENCOUNTER — Ambulatory Visit (INDEPENDENT_AMBULATORY_CARE_PROVIDER_SITE_OTHER): Payer: BLUE CROSS/BLUE SHIELD | Admitting: Ophthalmology

## 2015-07-03 DIAGNOSIS — H2513 Age-related nuclear cataract, bilateral: Secondary | ICD-10-CM | POA: Diagnosis not present

## 2015-07-03 DIAGNOSIS — D3131 Benign neoplasm of right choroid: Secondary | ICD-10-CM

## 2015-07-03 DIAGNOSIS — H35033 Hypertensive retinopathy, bilateral: Secondary | ICD-10-CM | POA: Diagnosis not present

## 2015-07-03 DIAGNOSIS — H33303 Unspecified retinal break, bilateral: Secondary | ICD-10-CM

## 2015-07-03 DIAGNOSIS — I10 Essential (primary) hypertension: Secondary | ICD-10-CM | POA: Diagnosis not present

## 2015-07-03 DIAGNOSIS — H43813 Vitreous degeneration, bilateral: Secondary | ICD-10-CM

## 2015-07-08 ENCOUNTER — Other Ambulatory Visit: Payer: Self-pay | Admitting: Interventional Cardiology

## 2015-07-17 ENCOUNTER — Other Ambulatory Visit: Payer: Self-pay | Admitting: Interventional Cardiology

## 2015-07-27 ENCOUNTER — Telehealth: Payer: Self-pay | Admitting: Interventional Cardiology

## 2015-07-27 NOTE — Telephone Encounter (Signed)
Pt c/o BP issue: STAT if pt c/o blurred vision, one-sided weakness or slurred speech  1. What are your last 5 BP readings? [facility] @ [facility]- 127/75, 132/80- both taken today 7/13  2. Are you having any other symptoms (ex. Dizziness, headache, blurred vision, passed out)? Dizziness, flushed  3. What is your BP issue? Pt had symptoms and thought his BP was alittle high.

## 2015-07-27 NOTE — Telephone Encounter (Signed)
The pt c/o dizziness and of feeling flushed and wants to know if his BP is too high. He is advised that the BP readings he reported to [place] from this morning at [facility] are very similar to his BPs recorded at prior OVs here with Dr Irish Lack. He requested that I discuss his BP readings with our DOD just to be sure his dizziness is not due to his BP.  I spoke with Elberta Leatherwood, Pharm D, who states that the pts BP readings from this am are ok and that he should contact his PCP for advisement.  The pt is advised and he verbalized understanding. He thanked me for calling him back and states that he will f/u with his PCP.

## 2015-07-27 NOTE — Telephone Encounter (Signed)
LMTCB

## 2015-08-07 ENCOUNTER — Other Ambulatory Visit: Payer: Self-pay

## 2015-08-07 MED ORDER — ROSUVASTATIN CALCIUM 40 MG PO TABS: ORAL_TABLET | ORAL | 0 refills | Status: DC

## 2015-09-19 ENCOUNTER — Other Ambulatory Visit: Payer: Self-pay | Admitting: Interventional Cardiology

## 2015-09-21 ENCOUNTER — Other Ambulatory Visit: Payer: Self-pay | Admitting: *Deleted

## 2015-09-21 MED ORDER — LISINOPRIL 5 MG PO TABS: ORAL_TABLET | ORAL | 0 refills | Status: DC

## 2015-09-22 ENCOUNTER — Ambulatory Visit: Payer: BLUE CROSS/BLUE SHIELD | Admitting: Interventional Cardiology

## 2015-09-24 NOTE — Progress Notes (Signed)
Patient ID: EDITH KELDERMAN, male   DOB: 1950-10-17, 65 y.o.   MRN: [id number]      [place], [place], [place]  [address] Phone: [phone] Fax:  [phone]  Date:  09/25/2015   ID:  ELDRA LENTON, DOB Jan 27, 1950, MRN [id number]  PCP:  Simona Huh, MD      History of Present Illness: VERSIE PIERRE is a 65 y.o. male who has multivessel CAD. He had stents in 2011. Infrequent chest pain, unlike his prior angina/MI in severity. There is typically back pain involved.  No exertional sx. No longer Rides exercise bike and lifts weight without any chest pain. He has hip and back problems.  Pain is nearly daily.  It is not at all like the heart attack. No significant chest pressure. Off CPAP for OSA, unable to tolerate.  He has given up on the CPAP at this point.  He feels that he is sleeping ok.  He uses Ambien.  Otherwise, pain would keep him awake.  CAD/ASCVD:  No chest pain; no sx like what he had before the stents Denies : Dizziness. -  He had some a month ago or so, but this has resolved. Leg edema.  Nitroglycerin.  Orthopnea.   Syncope.   No more palpitations.  Does not limit activity.     Wt Readings from Last 3 Encounters:  09/25/15 225 lb (102.1 kg)  03/28/14 221 lb (100.2 kg)  01/24/14 219 lb (99.3 kg)     Past Medical History:  Diagnosis Date  . Basal cell carcinoma    MOHS  . Coronary artery disease 9/11   S/P MI and PTCA--X 4 STENTS  . Diverticulosis   . Hip pain    CHRONIC  . Hyperlipidemia   . Neuropathy ([place])    IDIOPATHIC    Current Outpatient Prescriptions  Medication Sig Dispense Refill  . aspirin EC 81 MG tablet Take 1 tablet (81 mg total) by mouth daily.    . clopidogrel (PLAVIX) 75 MG tablet TAKE 1 TABLET BY MOUTH DAILY WITH BREAKFAST 30 tablet 0  . DULoxetine (CYMBALTA) 60 MG capsule Take 60 mg by mouth daily.    [name] gabapentin (NEURONTIN) 600 MG tablet Take 600 mg by mouth 3 (three) times daily.    [name] lisinopril (PRINIVIL,ZESTRIL) 5 MG  tablet TAKE 1 TABLET (5 MG TOTAL) BY MOUTH DAILY. 30 tablet 0  . metoprolol succinate (TOPROL-XL) 25 MG 24 hr tablet TAKE 1/2 TABLET BY MOUTH 2 TIMES DAILY 30 tablet 0  . nitroGLYCERIN (NITROSTAT) 0.4 MG SL tablet Place 1 tablet (0.4 mg total) under the tongue every 5 (five) minutes as needed for chest pain. 25 tablet 6  . Omega-3 Fatty Acids (FISH OIL) 1000 MG CAPS Take 2 capsules (2,000 mg total) by mouth daily.  0  . oxyCODONE-acetaminophen (PERCOCET) 10-325 MG per tablet Take 1 tablet by mouth every 4 (four) hours as needed for pain.    . rosuvastatin (CRESTOR) 40 MG tablet TAKE 1 TABLET (40 MG TOTAL) BY MOUTH DAILY. 90 tablet 0  . zolpidem (AMBIEN) 5 MG tablet Take 5 mg by mouth at bedtime.  3   No current facility-administered medications for this visit.     Allergies:    Allergies  Allergen Reactions  . Mirapex [Pramipexole Dihydrochloride] Other (See Comments)    Patient does not remember   . Trazodone And Nefazodone Other (See Comments)    DROWSY    Social History:  The patient  reports that he has never smoked. He does not have any smokeless tobacco history on file. He reports that he does not use drugs.   Family History:  The patient's family history includes Cancer in his mother; Lung cancer in his father.   ROS:  Please see the history of present illness.  No nausea, vomiting.  No fevers, chills.  No focal weakness.  No dysuria.    All other systems reviewed and negative.   PHYSICAL EXAM: VS:  BP 116/80 (BP Location: Right Arm, Patient Position: Sitting, Cuff Size: Normal)   Pulse 64   Ht 5\' 11"  (1.803 m)   Wt 225 lb (102.1 kg)   BMI 31.38 kg/m  Well nourished, well developed, in no acute distress  HEENT: normal  Neck: no JVD , no carotid bruits Cardiac:  normal S1, S2; RRR;   Lungs:  clear to auscultation bilaterally, no wheezing, rhonchi or rales  Abd: soft, nontender, no hepatomegaly  Ext: no edema  Skin: warm and dry  Neuro:   no focal abnormalities  noted Psych: normal affect  EKG:    NSR, LBBB  ASSESSMENT AND PLAN:  Myocardial Infarction, Old  Decreased Aspirin Tablet, 81 MG, 1 tablet, Orally, Once a day Continue Clopidogrel Bisulfate Tablet, 75 MG, 1 tablet, Orally, Once a day No signs of CHF. EF normal on prior stress test in 2014. Refill SL NTG. no symptoms like his prior MI.      2. Mixed hyperlipidemia  Now on  rosuvastatin Tablet, 40 MG, 1 tablet every evening, Orally, Once a day, 90, Refills 3 TG increased. Tried CoQ10 200 mg daily (Co Enzyme Q10) to help with muscle pain in the past. LDL 73 in 2013.  6/14 : LDL 84, HDL 42 .  LDL 61.  TG elevated.  Should improve with lifestyle changes. Lipids were checked by PMD most recently.  Will try to obtain those results.    3. Coronary atherosclerosis of native coronary artery    Multivessel DES.  Cannot walk treadmill due to hip pain. No longer exercise bike. Chemical Stress test in 2014 was negative.   No exertional sx.  No further testing at this time.  He will let us know if he has sx.  if he gets his hip and back pain under control, he will go back to more exercise. He has gained some weight since the exercise has dropped off.    4. Hypertension, essential  Continue Lisinopril Tablet, 5 MG, 1 tablet, Orally, Once a day Continue Metoprolol Tartrate Tablet, 25 MG, 1/2 tab, Orally, Twice a day Controlled.    5. Palpitations: He did have PVCs on his monitor in Jan 2016. No sustained arrhythmias.  No correlating sx.  Symptoms are short-lived. Will check 30 day event monitor to rule out significant arrhythmia. No syncope or presyncope reported.  6. 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 It looks like this is progressed to a left bundle branch block at baseline.  Signed, Mina Marble, MD, [facility] 09/25/2015 9:28 AM

## 2015-09-25 ENCOUNTER — Ambulatory Visit (INDEPENDENT_AMBULATORY_CARE_PROVIDER_SITE_OTHER): Payer: PPO | Admitting: Interventional Cardiology

## 2015-09-25 ENCOUNTER — Encounter: Payer: Self-pay | Admitting: Interventional Cardiology

## 2015-09-25 VITALS — BP 116/80 | HR 64 | Ht 71.0 in | Wt 225.0 lb

## 2015-09-25 DIAGNOSIS — I252 Old myocardial infarction: Secondary | ICD-10-CM | POA: Diagnosis not present

## 2015-09-25 DIAGNOSIS — M5416 Radiculopathy, lumbar region: Secondary | ICD-10-CM | POA: Diagnosis not present

## 2015-09-25 DIAGNOSIS — E782 Mixed hyperlipidemia: Secondary | ICD-10-CM

## 2015-09-25 DIAGNOSIS — I447 Left bundle-branch block, unspecified: Secondary | ICD-10-CM | POA: Insufficient documentation

## 2015-09-25 DIAGNOSIS — I2510 Atherosclerotic heart disease of native coronary artery without angina pectoris: Secondary | ICD-10-CM | POA: Diagnosis not present

## 2015-09-25 DIAGNOSIS — Z23 Encounter for immunization: Secondary | ICD-10-CM | POA: Diagnosis not present

## 2015-09-25 DIAGNOSIS — I10 Essential (primary) hypertension: Secondary | ICD-10-CM | POA: Diagnosis not present

## 2015-09-25 MED ORDER — ROSUVASTATIN CALCIUM 40 MG PO TABS: ORAL_TABLET | ORAL | 3 refills | Status: DC

## 2015-09-25 MED ORDER — LISINOPRIL 5 MG PO TABS: ORAL_TABLET | ORAL | 3 refills | Status: DC

## 2015-09-25 MED ORDER — METOPROLOL SUCCINATE ER 25 MG PO TB24: ORAL_TABLET | ORAL | 3 refills | Status: DC

## 2015-09-25 MED ORDER — CLOPIDOGREL BISULFATE 75 MG PO TABS: 75.0000 mg | ORAL_TABLET | Freq: Every day | ORAL | 3 refills | Status: DC

## 2015-09-25 NOTE — Patient Instructions (Signed)
Your physician recommends that you continue on your current medications as directed. Please refer to the Current Medication list given to you today. Your physician wants you to follow-up in: 1 year with Dr. Irish Lack.  You will receive a reminder letter in the mail two months in advance. If you don't receive a letter, please call our office to schedule the follow-up appointment.  Please ask your PCP to forward your lab work to Dr. Irish Lack.

## 2015-10-09 DIAGNOSIS — G4700 Insomnia, unspecified: Secondary | ICD-10-CM | POA: Diagnosis not present

## 2015-10-09 DIAGNOSIS — M1612 Unilateral primary osteoarthritis, left hip: Secondary | ICD-10-CM | POA: Diagnosis not present

## 2015-10-09 DIAGNOSIS — G609 Hereditary and idiopathic neuropathy, unspecified: Secondary | ICD-10-CM | POA: Diagnosis not present

## 2015-10-09 DIAGNOSIS — G894 Chronic pain syndrome: Secondary | ICD-10-CM | POA: Diagnosis not present

## 2015-11-10 DIAGNOSIS — G4700 Insomnia, unspecified: Secondary | ICD-10-CM | POA: Diagnosis not present

## 2015-11-10 DIAGNOSIS — M1612 Unilateral primary osteoarthritis, left hip: Secondary | ICD-10-CM | POA: Diagnosis not present

## 2015-11-10 DIAGNOSIS — G609 Hereditary and idiopathic neuropathy, unspecified: Secondary | ICD-10-CM | POA: Diagnosis not present

## 2015-11-10 DIAGNOSIS — G894 Chronic pain syndrome: Secondary | ICD-10-CM | POA: Diagnosis not present

## 2015-12-01 DIAGNOSIS — M7061 Trochanteric bursitis, right hip: Secondary | ICD-10-CM | POA: Diagnosis not present

## 2015-12-01 DIAGNOSIS — M7062 Trochanteric bursitis, left hip: Secondary | ICD-10-CM | POA: Diagnosis not present

## 2015-12-12 ENCOUNTER — Telehealth: Payer: Self-pay | Admitting: Interventional Cardiology

## 2015-12-12 NOTE — Telephone Encounter (Signed)
New Message  Pt c/o BP issue: STAT if pt c/o blurred vision, one-sided weakness or slurred speech  1. What are your last 5 BP readings? Pt give two readings... Most recent 129/82... 156/87  2. Are you having any other symptoms (ex. Dizziness, headache, blurred vision, passed out)? Per pt feeling weird   3. What is your BP issue? Per pt call would like to know if he would need to be seen for his bp. Please call back to discuss

## 2015-12-12 NOTE — Telephone Encounter (Signed)
The pt c/o slight chest pressure (that he states maybe gerd/heartburn) and elevated BP. He denies CP, SOB, dizziness, headaches, confusion, weakness or fatigue.  The pt states that he checks his BP X 3 daily. He reports his BPs as : Yesterday afternoon 11/27- BP= 166/87 Last night 11/27- BP=157/87 This am 11/28- BP=129/82  He states that he takes Metoprolol 12.5 mg in the am and 12.5 mg in the pm daily and Lisinopril every night.  He admits that he often checks his BP before he takes his medications.  He is advised to check his BP in the am about 1 and 1/2 to 2 hours after he has taken his morning medications and to check his BP in the pm about 1 and 1/2 to 2 hours after he has taken his nightly medications. Also he is advised that he may check his BP in the afternoon as well. He states that he will write his BP readings down and call me back on Monday 12/18/15 to report his readings.  He is advised that if his chest pressure continues or worsens to try NTG up to 3 times with 5 mins in between each dose and that if the NTG is ineffective to call 911 or have someone drive him to the ER. He verbalized understanding and is in agreement with plan.

## 2015-12-18 NOTE — Telephone Encounter (Signed)
I called the pt to see how he is doing. He states that he forgot to call me today and is at the park with his grandchildren at this time. He states that he does not have his BP list with him but from memory he states that his BP is better. He states that his SBP stays around 120s to 140s and that his DBP stays around 80 and that it never goes as high as 90.  He states that he will call me back if his BPs become irregular or if he develops any sx. He is aware to try NTG X 3 if he develops CP/discomfort and that if NTG is not effective he will call 911 or have someone drive him to the closet ER.  Will forward to Dr Irish Lack as Juluis Rainier.

## 2016-01-05 ENCOUNTER — Other Ambulatory Visit: Payer: Self-pay | Admitting: *Deleted

## 2016-01-05 NOTE — Telephone Encounter (Signed)
Medication Detail    Disp Refills Start End   lisinopril (PRINIVIL,ZESTRIL) 5 MG tablet 90 tablet 3 09/25/2015    Sig: TAKE 1 TABLET (5 MG TOTAL) BY MOUTH DAILY.   E-Prescribing Status: Receipt confirmed by pharmacy (09/25/2015 10:08 AM EDT)   Pharmacy   [id number] - [place], [address]

## 2016-01-22 DIAGNOSIS — G4700 Insomnia, unspecified: Secondary | ICD-10-CM | POA: Diagnosis not present

## 2016-01-22 DIAGNOSIS — G894 Chronic pain syndrome: Secondary | ICD-10-CM | POA: Diagnosis not present

## 2016-01-22 DIAGNOSIS — M1612 Unilateral primary osteoarthritis, left hip: Secondary | ICD-10-CM | POA: Diagnosis not present

## 2016-01-22 DIAGNOSIS — G609 Hereditary and idiopathic neuropathy, unspecified: Secondary | ICD-10-CM | POA: Diagnosis not present

## 2016-02-16 DIAGNOSIS — G629 Polyneuropathy, unspecified: Secondary | ICD-10-CM | POA: Diagnosis not present

## 2016-02-16 DIAGNOSIS — M25559 Pain in unspecified hip: Secondary | ICD-10-CM | POA: Diagnosis not present

## 2016-02-16 DIAGNOSIS — E782 Mixed hyperlipidemia: Secondary | ICD-10-CM | POA: Diagnosis not present

## 2016-02-16 DIAGNOSIS — Z23 Encounter for immunization: Secondary | ICD-10-CM | POA: Diagnosis not present

## 2016-02-16 DIAGNOSIS — I2510 Atherosclerotic heart disease of native coronary artery without angina pectoris: Secondary | ICD-10-CM | POA: Diagnosis not present

## 2016-02-16 DIAGNOSIS — Z0000 Encounter for general adult medical examination without abnormal findings: Secondary | ICD-10-CM | POA: Diagnosis not present

## 2016-03-04 DIAGNOSIS — G4700 Insomnia, unspecified: Secondary | ICD-10-CM | POA: Diagnosis not present

## 2016-03-04 DIAGNOSIS — M1612 Unilateral primary osteoarthritis, left hip: Secondary | ICD-10-CM | POA: Diagnosis not present

## 2016-03-04 DIAGNOSIS — G894 Chronic pain syndrome: Secondary | ICD-10-CM | POA: Diagnosis not present

## 2016-03-04 DIAGNOSIS — G609 Hereditary and idiopathic neuropathy, unspecified: Secondary | ICD-10-CM | POA: Diagnosis not present

## 2016-03-11 DIAGNOSIS — M7062 Trochanteric bursitis, left hip: Secondary | ICD-10-CM | POA: Diagnosis not present

## 2016-03-11 DIAGNOSIS — M7061 Trochanteric bursitis, right hip: Secondary | ICD-10-CM | POA: Diagnosis not present

## 2016-04-08 DIAGNOSIS — M1612 Unilateral primary osteoarthritis, left hip: Secondary | ICD-10-CM | POA: Diagnosis not present

## 2016-04-08 DIAGNOSIS — G894 Chronic pain syndrome: Secondary | ICD-10-CM | POA: Diagnosis not present

## 2016-04-08 DIAGNOSIS — G4700 Insomnia, unspecified: Secondary | ICD-10-CM | POA: Diagnosis not present

## 2016-04-08 DIAGNOSIS — G609 Hereditary and idiopathic neuropathy, unspecified: Secondary | ICD-10-CM | POA: Diagnosis not present

## 2016-05-10 ENCOUNTER — Other Ambulatory Visit: Payer: Self-pay | Admitting: Interventional Cardiology

## 2016-05-10 MED ORDER — ROSUVASTATIN CALCIUM 40 MG PO TABS: ORAL_TABLET | ORAL | 1 refills | Status: DC

## 2016-05-20 DIAGNOSIS — M1612 Unilateral primary osteoarthritis, left hip: Secondary | ICD-10-CM | POA: Diagnosis not present

## 2016-05-20 DIAGNOSIS — G4700 Insomnia, unspecified: Secondary | ICD-10-CM | POA: Diagnosis not present

## 2016-05-20 DIAGNOSIS — G894 Chronic pain syndrome: Secondary | ICD-10-CM | POA: Diagnosis not present

## 2016-05-20 DIAGNOSIS — G609 Hereditary and idiopathic neuropathy, unspecified: Secondary | ICD-10-CM | POA: Diagnosis not present

## 2016-06-03 DIAGNOSIS — H524 Presbyopia: Secondary | ICD-10-CM | POA: Diagnosis not present

## 2016-06-03 DIAGNOSIS — H5203 Hypermetropia, bilateral: Secondary | ICD-10-CM | POA: Diagnosis not present

## 2016-06-03 DIAGNOSIS — H52223 Regular astigmatism, bilateral: Secondary | ICD-10-CM | POA: Diagnosis not present

## 2016-06-03 DIAGNOSIS — H2513 Age-related nuclear cataract, bilateral: Secondary | ICD-10-CM | POA: Diagnosis not present

## 2016-06-17 DIAGNOSIS — G894 Chronic pain syndrome: Secondary | ICD-10-CM | POA: Diagnosis not present

## 2016-06-17 DIAGNOSIS — M1612 Unilateral primary osteoarthritis, left hip: Secondary | ICD-10-CM | POA: Diagnosis not present

## 2016-06-17 DIAGNOSIS — G609 Hereditary and idiopathic neuropathy, unspecified: Secondary | ICD-10-CM | POA: Diagnosis not present

## 2016-06-17 DIAGNOSIS — G4700 Insomnia, unspecified: Secondary | ICD-10-CM | POA: Diagnosis not present

## 2016-06-26 ENCOUNTER — Other Ambulatory Visit: Payer: Self-pay | Admitting: Interventional Cardiology

## 2016-06-26 MED ORDER — LISINOPRIL 5 MG PO TABS: ORAL_TABLET | ORAL | 0 refills | Status: DC

## 2016-07-04 ENCOUNTER — Encounter: Payer: Self-pay | Admitting: Interventional Cardiology

## 2016-07-15 DIAGNOSIS — G894 Chronic pain syndrome: Secondary | ICD-10-CM | POA: Diagnosis not present

## 2016-07-15 DIAGNOSIS — G609 Hereditary and idiopathic neuropathy, unspecified: Secondary | ICD-10-CM | POA: Diagnosis not present

## 2016-07-15 DIAGNOSIS — M1612 Unilateral primary osteoarthritis, left hip: Secondary | ICD-10-CM | POA: Diagnosis not present

## 2016-07-15 DIAGNOSIS — G4700 Insomnia, unspecified: Secondary | ICD-10-CM | POA: Diagnosis not present

## 2016-07-19 ENCOUNTER — Other Ambulatory Visit: Payer: Self-pay | Admitting: Interventional Cardiology

## 2016-07-19 MED ORDER — METOPROLOL SUCCINATE ER 25 MG PO TB24: ORAL_TABLET | ORAL | 0 refills | Status: DC

## 2016-07-21 NOTE — Progress Notes (Signed)
Cardiology Office Note   Date:  07/22/2016   ID:  Wayne [name], DOB 06-10-1950, MRN [id number]  PCP:  Gaynelle Arabian, MD    No chief complaint on file.  CAD  Wt Readings from Last 3 Encounters:  07/22/16 221 lb (100.2 kg)  09/25/15 225 lb (102.1 kg)  03/28/14 221 lb (100.2 kg)       History of Present Illness: Wayne [name] is a 66 y.o. male  who has multivessel CAD. He had stents in 2011.   He is limited by chronic back pain and hip pain.  He has had OSA but did not tolerate CPAP; he can't sleep on his back.  He feels that he sleeps well.   He continues to work and plays golf, despite his above joint pains.  Denies : Chest pain. Dizziness. Leg edema. Nitroglycerin use. Orthopnea. Palpitations. Paroxysmal nocturnal dyspnea. Shortness of breath. Syncope.   Working hard to avoid weight gain while his exercise is limited.    Past Medical History:  Diagnosis Date  . Basal cell carcinoma    MOHS  . Coronary artery disease 9/11   S/P MI and PTCA--X 4 STENTS  . Diverticulosis   . Hip pain    CHRONIC  . Hyperlipidemia   . Neuropathy    IDIOPATHIC    Past Surgical History:  Procedure Laterality Date  . CARDIAC CATHETERIZATION  09/11  . CHOLECYSTECTOMY    . RIGHT SHOULDER BONE TUMOR    . SLIPPED CAPITAL FEMORAL EPIPHYSIS PINNING       Current Outpatient Prescriptions  Medication Sig Dispense Refill  . aspirin EC 81 MG tablet Take 1 tablet (81 mg total) by mouth daily.    . clopidogrel (PLAVIX) 75 MG tablet Take 1 tablet (75 mg total) by mouth daily with breakfast. 90 tablet 3  . DULoxetine (CYMBALTA) 60 MG capsule Take 60 mg by mouth daily.    [name] gabapentin (NEURONTIN) 600 MG tablet Take 600 mg by mouth 3 (three) times daily.    [name] lisinopril (PRINIVIL,ZESTRIL) 5 MG tablet TAKE 1 TABLET (5 MG TOTAL) BY MOUTH DAILY. 90 tablet 0  . metoprolol succinate (TOPROL-XL) 25 MG 24 hr tablet TAKE 1/2 TABLET BY MOUTH 2 TIMES DAILY 90 tablet 0  . nitroGLYCERIN (NITROSTAT)  0.4 MG SL tablet Place 1 tablet (0.4 mg total) under the tongue every 5 (five) minutes as needed for chest pain. 25 tablet 6  . Omega-3 Fatty Acids (FISH OIL) 1000 MG CAPS Take 2 capsules (2,000 mg total) by mouth daily.  0  . oxyCODONE-acetaminophen (PERCOCET) 10-325 MG per tablet Take 1 tablet by mouth every 4 (four) hours as needed for pain.    . rosuvastatin (CRESTOR) 40 MG tablet TAKE 1 TABLET (40 MG TOTAL) BY MOUTH DAILY. 90 tablet 1  . zolpidem (AMBIEN) 5 MG tablet Take 5 mg by mouth at bedtime.  3   No current facility-administered medications for this visit.     Allergies:   Mirapex [pramipexole dihydrochloride] and Trazodone and nefazodone    Social History:  The patient  reports that he has never smoked. He has never used smokeless tobacco. He reports that he does not use drugs.   Family History:  The patient's family history includes Cancer in his mother; Lung cancer in his father.    ROS:  Please see the history of present illness.   Otherwise, review of systems are positive for back pain.  Rare shortlived chest pains that are not like  his prior angina.  All other systems are reviewed and negative.    PHYSICAL EXAM: VS:  BP 116/72   Pulse 73   Ht 6' (1.829 m)   Wt 221 lb (100.2 kg)   SpO2 95%   BMI 29.97 kg/m  , BMI Body mass index is 29.97 kg/m. GEN: Well nourished, well developed, in no acute distress  HEENT: normal  Neck: no JVD, carotid bruits, or masses Cardiac: RRR; no murmurs, rubs, or gallops,no edema  Respiratory:  clear to auscultation bilaterally, normal work of breathing GI: soft, nontender, nondistended, + BS MS: no deformity or atrophy  Skin: warm and dry, no rash Neuro:  Strength and sensation are intact Psych: euthymic mood, full affect   EKG:   The ekg ordered today demonstrates NSR, LBBB   Recent Labs: No results found for requested labs within last 8760 hours.   Lipid Panel    Component Value Date/Time   CHOL 128 07/25/2014 0806    TRIG 94.0 07/25/2014 0806   HDL 38.70 (L) 07/25/2014 0806   CHOLHDL 3 07/25/2014 0806   VLDL 18.8 07/25/2014 0806   LDLCALC 71 07/25/2014 0806   LDLDIRECT 76.8 02/22/2013 0748     Other studies Reviewed: Additional studies/ records that were reviewed today with results demonstrating: 2014- stress test was without ischemia. February 2018 LDL 60, creatinine 0.72, HDL 45, triglycerides 163.    ASSESSMENT AND PLAN:  1. CAD/Old MI: Angina controlled on current medical therapy.  Prior angina was like severe heartburn/pressure. Stress test from 2014 reviewed and was normal.  No sx like prior angina.  Stay active as noted below, as his joints allow.  2. Hyperlipidemia: Well controlled. COntinue current meds.  3. LBBB: rate dependent LBBB in the past.  Now with chronic LBBB. 4. PVCs noted in the past on monitor.  No sx at this time.   5. If hip surgery needed, okay to hold clopidogrel 5 days prior. No further cardiac testing will be needed before surgery.   Current medicines are reviewed at length with the patient today.  The patient concerns regarding his medicines were addressed.  The following changes have been made:  No change*  Labs/ tests ordered today include:  No orders of the defined types were placed in this encounter.   Recommend 150 minutes/week of aerobic exercise Low fat, low carb, high fiber diet recommended  Disposition:   FU in 1 year   Signed, Larae Grooms, MD  07/22/2016 10:07 AM    [facility] [place], [place], [place]  [address] Phone: [phone]; Fax: [phone]

## 2016-07-22 ENCOUNTER — Ambulatory Visit (INDEPENDENT_AMBULATORY_CARE_PROVIDER_SITE_OTHER): Payer: PPO | Admitting: Interventional Cardiology

## 2016-07-22 ENCOUNTER — Encounter: Payer: Self-pay | Admitting: Interventional Cardiology

## 2016-07-22 VITALS — BP 116/72 | HR 73 | Ht 72.0 in | Wt 221.0 lb

## 2016-07-22 DIAGNOSIS — I10 Essential (primary) hypertension: Secondary | ICD-10-CM

## 2016-07-22 DIAGNOSIS — I25118 Atherosclerotic heart disease of native coronary artery with other forms of angina pectoris: Secondary | ICD-10-CM | POA: Diagnosis not present

## 2016-07-22 DIAGNOSIS — I447 Left bundle-branch block, unspecified: Secondary | ICD-10-CM

## 2016-07-22 DIAGNOSIS — E782 Mixed hyperlipidemia: Secondary | ICD-10-CM

## 2016-07-22 MED ORDER — NITROGLYCERIN 0.4 MG SL SUBL: 0.4000 mg | SUBLINGUAL_TABLET | SUBLINGUAL | 2 refills | Status: DC | PRN

## 2016-07-22 NOTE — Patient Instructions (Signed)
Medication Instructions:  Your physician recommends that you continue on your current medications as directed. Please refer to the Current Medication list given to you today.   Labwork: None Ordered  Testing/Procedures: None Ordered  Follow-Up: Your physician wants you to follow-up in: 1 year with Dr Varanasi.   You will receive a reminder letter in the mail two months in advance. If you don't receive a letter, please call our office to schedule the follow-up appointment.   Any Other Special Instructions Will Be Listed Below (If Applicable).     If you need a refill on your cardiac medications before your next appointment, please call your pharmacy.  

## 2016-08-09 ENCOUNTER — Encounter (HOSPITAL_BASED_OUTPATIENT_CLINIC_OR_DEPARTMENT_OTHER): Payer: Self-pay | Admitting: Emergency Medicine

## 2016-08-09 ENCOUNTER — Emergency Department (HOSPITAL_BASED_OUTPATIENT_CLINIC_OR_DEPARTMENT_OTHER): Payer: PPO

## 2016-08-09 ENCOUNTER — Emergency Department (HOSPITAL_BASED_OUTPATIENT_CLINIC_OR_DEPARTMENT_OTHER)
Admission: EM | Admit: 2016-08-09 | Discharge: 2016-08-09 | Disposition: A | Discharge status: Home / Self Care | Payer: PPO | Attending: Emergency Medicine | Admitting: Emergency Medicine

## 2016-08-09 DIAGNOSIS — R609 Edema, unspecified: Secondary | ICD-10-CM | POA: Diagnosis not present

## 2016-08-09 DIAGNOSIS — I2510 Atherosclerotic heart disease of native coronary artery without angina pectoris: Secondary | ICD-10-CM | POA: Diagnosis not present

## 2016-08-09 DIAGNOSIS — M7989 Other specified soft tissue disorders: Secondary | ICD-10-CM | POA: Diagnosis not present

## 2016-08-09 DIAGNOSIS — R600 Localized edema: Secondary | ICD-10-CM | POA: Diagnosis not present

## 2016-08-09 DIAGNOSIS — Z7982 Long term (current) use of aspirin: Secondary | ICD-10-CM | POA: Diagnosis not present

## 2016-08-09 DIAGNOSIS — I10 Essential (primary) hypertension: Secondary | ICD-10-CM | POA: Insufficient documentation

## 2016-08-09 DIAGNOSIS — Z85828 Personal history of other malignant neoplasm of skin: Secondary | ICD-10-CM | POA: Diagnosis not present

## 2016-08-09 DIAGNOSIS — Z79899 Other long term (current) drug therapy: Secondary | ICD-10-CM | POA: Insufficient documentation

## 2016-08-09 DIAGNOSIS — I252 Old myocardial infarction: Secondary | ICD-10-CM | POA: Diagnosis not present

## 2016-08-09 DIAGNOSIS — R2241 Localized swelling, mass and lump, right lower limb: Secondary | ICD-10-CM | POA: Diagnosis present

## 2016-08-09 LAB — BASIC METABOLIC PANEL
Anion gap: 10 (ref 5–15)
BUN: 22 mg/dL — AB (ref 6–20)
CHLORIDE: 99 mmol/L — AB (ref 101–111)
CO2: 28 mmol/L (ref 22–32)
CREATININE: 0.83 mg/dL (ref 0.61–1.24)
Calcium: 8.8 mg/dL — ABNORMAL LOW (ref 8.9–10.3)
GFR calc Af Amer: >60 mL/min (ref >60)
GFR calc non Af Amer: >60 mL/min (ref >60)
Glucose, Bld: 118 mg/dL — ABNORMAL HIGH (ref 65–99)
Potassium: 3.7 mmol/L (ref 3.5–5.1)
Sodium: 137 mmol/L (ref 135–145)

## 2016-08-09 LAB — CBC
HCT: 37.1 % — ABNORMAL LOW (ref 39.0–52.0)
Hemoglobin: 12.7 g/dL — ABNORMAL LOW (ref 13.0–17.0)
MCH: 31.4 pg (ref 26.0–34.0)
MCHC: 34.2 g/dL (ref 30.0–36.0)
MCV: 91.6 fL (ref 78.0–100.0)
PLATELETS: 183 10*3/uL (ref 150–400)
RBC: 4.05 MIL/uL — ABNORMAL LOW (ref 4.22–5.81)
RDW: 12.6 % (ref 11.5–15.5)
WBC: 9.4 10*3/uL (ref 4.0–10.5)

## 2016-08-09 NOTE — ED Provider Notes (Signed)
[name] Note   CSN: [id number] Arrival date & time: 08/09/16  1622     History   Chief Complaint Chief Complaint  Patient presents with  . Leg Swelling    HPI Wayne [name] is a 66 y.o. male.  HPI Patient presents to the emergency room for evaluation of right leg swelling. Patient has a history of coronary artery disease. He is on Plavix. Patient states he was out golfing today. When he took off his socks he noticed that his right leg was swollen. He went to a doctor's office today. They were concerned about the possibility of a DVT. He was instructed to come to the emergency room to get an ultrasound. Patient denies any trouble with any chest pain or shortness of breath. He denies any injuries. Past Medical History:  Diagnosis Date  . Basal cell carcinoma    MOHS  . Coronary artery disease 9/11   S/P MI and PTCA--X 4 STENTS  . Diverticulosis   . Hip pain    CHRONIC  . Hyperlipidemia   . Neuropathy    IDIOPATHIC    Patient Active Problem List   Diagnosis Date Noted  . LBBB (left bundle branch block) 09/25/2015  . 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 Mixed hyperlipidemia 02/02/2013  . Coronary atherosclerosis of native coronary artery 02/02/2013  . Essential hypertension, benign 02/02/2013  . Unspecified hereditary and idiopathic peripheral neuropathy 02/02/2013    Past Surgical History:  Procedure Laterality Date  . CARDIAC CATHETERIZATION  09/11  . CHOLECYSTECTOMY    . RIGHT SHOULDER BONE TUMOR    . SLIPPED CAPITAL FEMORAL EPIPHYSIS PINNING         Home Medications    Prior to Admission medications   Medication Sig Start Date End Date Taking? Authorizing Provider  aspirin EC 81 MG tablet Take 1 tablet (81 mg total) by mouth daily. 01/24/14   Jettie Booze, MD  clopidogrel (PLAVIX) 75 MG tablet Take 1 tablet (75 mg total) by mouth daily with breakfast. 09/25/15   Jettie Booze, MD  DULoxetine (CYMBALTA) 60 MG capsule  Take 60 mg by mouth daily.    [provider]  gabapentin (NEURONTIN) 600 MG tablet Take 600 mg by mouth 3 (three) times daily.    [provider]  lisinopril (PRINIVIL,ZESTRIL) 5 MG tablet TAKE 1 TABLET (5 MG TOTAL) BY MOUTH DAILY. 06/26/16   Jettie Booze, MD  metoprolol succinate (TOPROL-XL) 25 MG 24 hr tablet TAKE 1/2 TABLET BY MOUTH 2 TIMES DAILY 07/19/16   Jettie Booze, MD  nitroGLYCERIN (NITROSTAT) 0.4 MG SL tablet Place 1 tablet (0.4 mg total) under the tongue every 5 (five) minutes as needed for chest pain. 07/22/16   Jettie Booze, MD  Omega-3 Fatty Acids (FISH OIL) 1000 MG CAPS Take 2 capsules (2,000 mg total) by mouth daily. 02/25/13   Jettie Booze, MD  oxyCODONE-acetaminophen (PERCOCET) 10-325 MG per tablet Take 1 tablet by mouth every 4 (four) hours as needed for pain.    [provider]  rosuvastatin (CRESTOR) 40 MG tablet TAKE 1 TABLET (40 MG TOTAL) BY MOUTH DAILY. 05/10/16   Jettie Booze, MD  zolpidem (AMBIEN) 5 MG tablet Take 5 mg by mouth at bedtime. 12/27/13   [provider]    Family History Family History  Problem Relation Age of Onset  . Cancer Mother   . Lung cancer Father     Social History Social History  Substance Use  Topics  . Smoking status: Never Smoker  . Smokeless tobacco: Never Used  . Alcohol use Not on file     Allergies   Mirapex [pramipexole dihydrochloride] and Trazodone and nefazodone   Review of Systems Review of Systems  All other systems reviewed and are negative.    Physical Exam Updated Vital Signs BP (!) 152/72 (BP Location: Right Arm)   Pulse 67   Temp 98 F (36.7 C) (Oral)   Resp 16   Ht 1.829 m (6')   Wt 100.2 kg (221 lb)   SpO2 98%   BMI 29.97 kg/m   Physical Exam  Constitutional: He appears well-developed and well-nourished. No distress.  HENT:  Head: Normocephalic and atraumatic.  Right Ear: External ear normal.  Left Ear: External ear normal.    Eyes: Conjunctivae are normal. Right eye exhibits no discharge. Left eye exhibits no discharge. No scleral icterus.  Neck: Neck supple. No tracheal deviation present.  Cardiovascular: Normal rate.   Pulmonary/Chest: Effort normal. No stridor. No respiratory distress.  Abdominal: He exhibits no distension.  Musculoskeletal:       Right lower leg: He exhibits tenderness, swelling and edema.  Edema of the right lower leg ankle and foot below the knee, , no erythema, no rash  Neurological: He is alert. Cranial nerve deficit: no gross deficits.  Skin: Skin is warm and dry. No rash noted.  Psychiatric: He has a normal mood and affect.  Nursing note and vitals reviewed.    ED Treatments / Results  Labs (all labs ordered are listed, but only abnormal results are displayed) Labs Reviewed  CBC - Abnormal; Notable for the following:       Result Value   RBC 4.05 (*)    Hemoglobin 12.7 (*)    HCT 37.1 (*)    All other components within normal limits  BASIC METABOLIC PANEL - Abnormal; Notable for the following:    Chloride 99 (*)    Glucose, Bld 118 (*)    BUN 22 (*)    Calcium 8.8 (*)    All other components within normal limits     Radiology US Venous Img Lower Unilateral Right  Result Date: 08/09/2016 CLINICAL DATA:  Right lower extremity swelling. EXAM: RIGHT LOWER EXTREMITY VENOUS DOPPLER ULTRASOUND TECHNIQUE: Gray-scale sonography with graded compression, as well as color Doppler and duplex ultrasound were performed to evaluate the lower extremity deep venous systems from the level of the common femoral vein and including the common femoral, femoral, profunda femoral, popliteal and calf veins including the posterior tibial, peroneal and gastrocnemius veins when visible. The superficial great saphenous vein was also interrogated. Spectral Doppler was utilized to evaluate flow at rest and with distal augmentation maneuvers in the common femoral, femoral and popliteal veins. COMPARISON:   None. FINDINGS: Contralateral Common Femoral Vein: Respiratory phasicity is normal and symmetric with the symptomatic side. No evidence of thrombus. Normal compressibility. Common Femoral Vein: No evidence of thrombus. Normal compressibility, respiratory phasicity and response to augmentation. Saphenofemoral Junction: No evidence of thrombus. Normal compressibility and flow on color Doppler imaging. Profunda Femoral Vein: No evidence of thrombus. Normal compressibility and flow on color Doppler imaging. Femoral Vein: No evidence of thrombus. Normal compressibility, respiratory phasicity and response to augmentation. Popliteal Vein: No evidence of thrombus. Normal compressibility, respiratory phasicity and response to augmentation. Calf Veins: No evidence of thrombus. Normal compressibility and flow on color Doppler imaging. Superficial Great Saphenous Vein: No evidence of thrombus. Normal compressibility and flow on color  Doppler imaging. Venous Reflux:  None. Other Findings:  None. IMPRESSION: No evidence of DVT within the right lower extremity. Electronically Signed   By: Lovey Newcomer M.D.   On: 08/09/2016 18:00    Procedures Procedures (including critical care time)  Medications Ordered in ED Medications - No data to display   Initial Impression / Assessment and Plan / ED Course  I have reviewed the triage vital signs and the nursing notes.  Pertinent labs & imaging results that were available during my care of the patient were reviewed by me and considered in my medical decision making (see chart for details).    The patient's laboratory tests are normal. His Doppler study does not show any evidence of DVT. As we discussed the patient's findings he mentioned he was wearing a knee brace while golfing today.  I suspect that led to his peripheral edema. We discussed keeping his leg elevated. This should resolve in the morning.  The next time he is golfing he should try to wear the brace a little bit  looser.  Final Clinical Impressions(s) / ED Diagnoses   Final diagnoses:  Peripheral edema    New Prescriptions New Prescriptions   No medications on file     Dorie Rank, MD 08/09/16 1816

## 2016-08-09 NOTE — Discharge Instructions (Signed)
Try wearing the brace looser the next time as we discussed, keep your leg elevated tonight as much as you can.

## 2016-08-09 NOTE — ED Triage Notes (Addendum)
Reports right leg swelling which began today.  Reports bilateral leg pain as well.  Referred from urgent care for ultrasound.

## 2016-08-12 ENCOUNTER — Telehealth: Payer: Self-pay | Admitting: Interventional Cardiology

## 2016-08-12 NOTE — Telephone Encounter (Signed)
New Message    Pt c/o medication issue:  1. Name of Medication: crestor   2. How are you currently taking this medication (dosage and times per day)?  1x a day   3. Are you having a reaction (difficulty breathing--STAT)? no  4. What is your medication issue? Severe muscle and joint pain

## 2016-08-12 NOTE — Telephone Encounter (Signed)
Patient states that he has been having bilateral muscle pain in his legs and that the joints in his legs have been hurting for the past couple of weeks. He said that he has been taking rosuvastatin 40 mg QD and wants to know if he can try something different.

## 2016-08-13 NOTE — Telephone Encounter (Signed)
Would he be a candidate for PCSK9 inhibitor?

## 2016-08-13 NOTE — Telephone Encounter (Signed)
Called patient back and made him aware of [facility] recommendations. Patient states that he is scheduled for an MRI of his back on Friday and wants to wait for the results from that before making any changes just to be sure. Patient states that he will call me back next week and let me know if he wants to change the dosing of his crestor.

## 2016-08-13 NOTE — Telephone Encounter (Signed)
Would first try cutting Crestor back to 20mg  daily with repeat lipid panel in 2-3 months to see if pt can tolerate lower dose. Ideally need pt to remain on highest tolerated dose of statin prior to [facility] for insurance approval.

## 2016-08-13 NOTE — Telephone Encounter (Signed)
Follow Up ° ° °Pt calling back °

## 2016-08-13 NOTE — Telephone Encounter (Signed)
Left message for patient to call back  

## 2016-08-15 ENCOUNTER — Ambulatory Visit (HOSPITAL_COMMUNITY)
Admission: EM | Admit: 2016-08-15 | Discharge: 2016-08-15 | Disposition: A | Discharge status: Home / Self Care | Payer: PPO | Attending: Family Medicine | Admitting: Family Medicine

## 2016-08-15 ENCOUNTER — Encounter (HOSPITAL_COMMUNITY): Payer: Self-pay | Admitting: *Deleted

## 2016-08-15 ENCOUNTER — Ambulatory Visit (INDEPENDENT_AMBULATORY_CARE_PROVIDER_SITE_OTHER): Payer: PPO

## 2016-08-15 DIAGNOSIS — M1711 Unilateral primary osteoarthritis, right knee: Secondary | ICD-10-CM

## 2016-08-15 DIAGNOSIS — M25461 Effusion, right knee: Secondary | ICD-10-CM | POA: Diagnosis not present

## 2016-08-15 DIAGNOSIS — M25561 Pain in right knee: Secondary | ICD-10-CM

## 2016-08-15 MED ORDER — TRIAMCINOLONE ACETONIDE 40 MG/ML IJ SUSP
INTRAMUSCULAR | Status: AC
  Filled 2016-08-15: qty 1

## 2016-08-15 MED ORDER — BUPIVACAINE HCL (PF) 0.5 % IJ SOLN
INTRAMUSCULAR | Status: AC
  Filled 2016-08-15: qty 10

## 2016-08-15 NOTE — ED Triage Notes (Signed)
R  Knee  Pain   X  6  Days      Seen   5  Days  Ago  In  Er    For   Leg  And ankle  Pain     Had     Ultrasound         Which    He    Reports      Was negative        He  denys  Any  specefic  inj        He  Has  Some  Swelling      And  Pain on  Weight  Bearing        He  Uses  A  [employer]       He  Reports  Pain in his  Hips  As   Well

## 2016-08-15 NOTE — Discharge Instructions (Signed)
Your knee has been drained and a steroid has been injected, rest, apply ice, keep the leg elevated, may have tylenol as needed for pain, follow up with your primary care provider or an orthopedist as needed.

## 2016-08-15 NOTE — ED Provider Notes (Signed)
CSN: [id number]     Arrival date & time 08/15/16  1448 History   None    Chief Complaint  Patient presents with  . Knee Pain   (Consider location/radiation/quality/duration/timing/severity/associated sxs/prior Treatment) Subjective:  Wayne [name] is a 66 y.o. male who presents with knee pain involving the right knee. Onset was gradual, starting about 6 days ago. Inciting event: none known, denies traumatic event or history of arthritis. Current symptoms include: pain located in the knee and radiating down the leg, stiffness and swelling. Pain is aggravated by any weight bearing, going up and down stairs and walking. Patient has had no prior knee problems. Evaluation to date: lab work: mildly abnormal but acceptable and no DVT on venous ultrasound . Treatment to date: brace which is not very effective, ice, OTC analgesics which are not very effective and rest.  The following portions of the patient's history were reviewed and updated as appropriate: allergies, current medications, past family history, past medical history, past social history, past surgical history and problem list.   Review of Systems Pertinent items noted in HPI and remainder of comprehensive ROS otherwise negative.  Objective:  BP (!) 142/72 (BP Location: Right Arm)   Pulse 78   Temp 98.6 F (37 C) (Oral)   Resp 18   SpO2 100%  Right knee: positive exam findings: effusion and tenderness noted upper, medial Left knee:  normal and no effusion, full active range of motion, no joint line tenderness, ligamentous structures intact.  X-ray right knee: shows DJD changes, likely chronic   Assessment:  Right knee pain, suspect degenerative changes   Plan:  Natural history and expected course discussed. Questions answered. Scientist, clinical (histocompatibility and immunogenetics) distributed. Rest, ice, compression, and elevation (RICE) therapy. Arthrocentesis. See procedure note. Plain film x-rays.    The history is provided by the patient.     Past Medical History:  Diagnosis Date  . Basal cell carcinoma    MOHS  . Coronary artery disease 9/11   S/P MI and PTCA--X 4 STENTS  . Diverticulosis   . Hip pain    CHRONIC  . Hyperlipidemia   . Neuropathy    IDIOPATHIC   Past Surgical History:  Procedure Laterality Date  . CARDIAC CATHETERIZATION  09/11  . CHOLECYSTECTOMY    . RIGHT SHOULDER BONE TUMOR    . SLIPPED CAPITAL FEMORAL EPIPHYSIS PINNING     Family History  Problem Relation Age of Onset  . Cancer Mother   . Lung cancer Father    Social History  Substance Use Topics  . Smoking status: Never Smoker  . Smokeless tobacco: Never Used  . Alcohol use Not on file    Review of Systems  Allergies  Mirapex [pramipexole dihydrochloride] and Trazodone and nefazodone  Home Medications   Prior to Admission medications   Medication Sig Start Date End Date Taking? Authorizing Provider  aspirin EC 81 MG tablet Take 1 tablet (81 mg total) by mouth daily. 01/24/14   Jettie Booze, MD  clopidogrel (PLAVIX) 75 MG tablet Take 1 tablet (75 mg total) by mouth daily with breakfast. 09/25/15   Jettie Booze, MD  DULoxetine (CYMBALTA) 60 MG capsule Take 60 mg by mouth daily.    [provider]  gabapentin (NEURONTIN) 600 MG tablet Take 600 mg by mouth 3 (three) times daily.    [provider]  lisinopril (PRINIVIL,ZESTRIL) 5 MG tablet TAKE 1 TABLET (5 MG TOTAL) BY MOUTH DAILY. 06/26/16   Jettie Booze, MD  metoprolol  succinate (TOPROL-XL) 25 MG 24 hr tablet TAKE 1/2 TABLET BY MOUTH 2 TIMES DAILY 07/19/16   Jettie Booze, MD  nitroGLYCERIN (NITROSTAT) 0.4 MG SL tablet Place 1 tablet (0.4 mg total) under the tongue every 5 (five) minutes as needed for chest pain. 07/22/16   Jettie Booze, MD  Omega-3 Fatty Acids (FISH OIL) 1000 MG CAPS Take 2 capsules (2,000 mg total) by mouth daily. 02/25/13   Jettie Booze, MD  oxyCODONE-acetaminophen (PERCOCET) 10-325 MG per tablet Take 1  tablet by mouth every 4 (four) hours as needed for pain.    [provider]  rosuvastatin (CRESTOR) 40 MG tablet TAKE 1 TABLET (40 MG TOTAL) BY MOUTH DAILY. 05/10/16   Jettie Booze, MD  zolpidem (AMBIEN) 5 MG tablet Take 5 mg by mouth at bedtime. 12/27/13   [provider]   Meds Ordered and Administered this Visit  Medications - No data to display  BP (!) 142/72 (BP Location: Right Arm)   Pulse 78   Temp 98.6 F (37 C) (Oral)   Resp 18   SpO2 100%  No data found.   Physical Exam  Urgent Care Course     .Joint Aspiration/Arthrocentesis Date/Time: 08/15/2016 4:50 PM Performed by: Barnet Glasgow Authorized by: Vanessa Kick   Consent:    Consent obtained:  Verbal   Consent given by:  Patient   Risks discussed:  Bleeding, infection and pain   Alternatives discussed:  Alternative treatment and referral Location:    Location:  Knee   Knee:  R knee Anesthesia (see MAR for exact dosages):    Anesthesia method:  None Procedure details:    Needle gauge:  22 G   Ultrasound guidance: no     Approach:  Medial   Aspirate amount:  10 cc   Aspirate characteristics:  Clear and yellow   Steroid injected: yes     Specimen collected: yes   Post-procedure details:    Dressing:  Adhesive bandage   Patient tolerance of procedure:  Tolerated well, no immediate complications Comments:     Area cleansed with Betadine, 10 mL of synovial fluid was aspirated from the joint, mixture of lidocaine, bupivacaine, and Kenalog was injected into the joint space, and bandage applied.   (including critical care time)  Labs Review Labs Reviewed  SYNOVIAL CELL COUNT + DIFF, W/ CRYSTALS    Imaging Review Dg Knee Complete 4 Views Right  Result Date: 08/15/2016 CLINICAL DATA:  Right knee pain for 2 weeks.  No injury. EXAM: RIGHT KNEE - COMPLETE 4+ VIEW COMPARISON:  None. FINDINGS: No evidence of fracture, dislocation, or joint effusion. No evidence of arthropathy or other  focal bone abnormality. Tiny tricompartmental osteophytes. Soft tissues are unremarkable. IMPRESSION: Minimal tricompartmental degenerative changes. No acute osseous abnormality. Electronically Signed   By: Titus Dubin M.D.   On: 08/15/2016 16:00       MDM   1. Acute pain of right knee   2. Primary osteoarthritis of right knee        Barnet Glasgow, NP 08/15/16 1653

## 2016-08-19 DIAGNOSIS — M5135 Other intervertebral disc degeneration, thoracolumbar region: Secondary | ICD-10-CM | POA: Diagnosis not present

## 2016-08-19 DIAGNOSIS — M5126 Other intervertebral disc displacement, lumbar region: Secondary | ICD-10-CM | POA: Diagnosis not present

## 2016-08-19 DIAGNOSIS — M7138 Other bursal cyst, other site: Secondary | ICD-10-CM | POA: Diagnosis not present

## 2016-08-19 DIAGNOSIS — M47816 Spondylosis without myelopathy or radiculopathy, lumbar region: Secondary | ICD-10-CM | POA: Diagnosis not present

## 2016-08-19 DIAGNOSIS — M47817 Spondylosis without myelopathy or radiculopathy, lumbosacral region: Secondary | ICD-10-CM | POA: Diagnosis not present

## 2016-08-20 DIAGNOSIS — M25561 Pain in right knee: Secondary | ICD-10-CM | POA: Diagnosis not present

## 2016-08-21 DIAGNOSIS — M7138 Other bursal cyst, other site: Secondary | ICD-10-CM | POA: Diagnosis not present

## 2016-08-23 NOTE — Telephone Encounter (Signed)
Called and spoke to patient who states that he found out he had a cyst on one of his vertebrae and that he also has a torn meniscus for which he is suppose to have an MRI tomorrow for. Patient states that he does not feel like it is his crestor causing his pain anymore and that he is going to continue taking his meds as he has been. Patient thanked me for checking in on him.

## 2016-08-24 DIAGNOSIS — M25561 Pain in right knee: Secondary | ICD-10-CM | POA: Diagnosis not present

## 2016-08-27 DIAGNOSIS — M25561 Pain in right knee: Secondary | ICD-10-CM | POA: Diagnosis not present

## 2016-08-28 DIAGNOSIS — M1612 Unilateral primary osteoarthritis, left hip: Secondary | ICD-10-CM | POA: Diagnosis not present

## 2016-08-28 DIAGNOSIS — G609 Hereditary and idiopathic neuropathy, unspecified: Secondary | ICD-10-CM | POA: Diagnosis not present

## 2016-08-28 DIAGNOSIS — G4700 Insomnia, unspecified: Secondary | ICD-10-CM | POA: Diagnosis not present

## 2016-08-28 DIAGNOSIS — G894 Chronic pain syndrome: Secondary | ICD-10-CM | POA: Diagnosis not present

## 2016-09-02 ENCOUNTER — Telehealth: Payer: Self-pay

## 2016-09-02 NOTE — Telephone Encounter (Signed)
Clearance faxed to [facility] at [phone]. Confirmation received that fax was successful.

## 2016-09-02 NOTE — Telephone Encounter (Signed)
1. okay to hold clopidogrel 5 days prior. No further cardiac testing will be needed before surgery.

## 2016-09-02 NOTE — Telephone Encounter (Signed)
Request for surgical clearance:  1. What type of surgery is being performed? Right Knee Scope Menisectomy   2. When is this surgery scheduled? Pending Clearance   3. Are there any medications that need to be held prior to surgery and how long? Plavix   4. Name of physician performing surgery? Dr. French Ana   5. What is your office phone and fax number? Phone- [phone], [phone]

## 2016-09-11 DIAGNOSIS — S83241A Other tear of medial meniscus, current injury, right knee, initial encounter: Secondary | ICD-10-CM | POA: Diagnosis not present

## 2016-09-11 DIAGNOSIS — Y999 Unspecified external cause status: Secondary | ICD-10-CM | POA: Diagnosis not present

## 2016-09-11 DIAGNOSIS — M94261 Chondromalacia, right knee: Secondary | ICD-10-CM | POA: Diagnosis not present

## 2016-09-11 DIAGNOSIS — G8918 Other acute postprocedural pain: Secondary | ICD-10-CM | POA: Diagnosis not present

## 2016-09-12 ENCOUNTER — Other Ambulatory Visit: Payer: Self-pay | Admitting: Interventional Cardiology

## 2016-09-12 MED ORDER — NITROGLYCERIN 0.4 MG SL SUBL: 0.4000 mg | SUBLINGUAL_TABLET | SUBLINGUAL | 5 refills | Status: DC | PRN

## 2016-09-17 DIAGNOSIS — S83241D Other tear of medial meniscus, current injury, right knee, subsequent encounter: Secondary | ICD-10-CM | POA: Diagnosis not present

## 2016-09-17 DIAGNOSIS — M25561 Pain in right knee: Secondary | ICD-10-CM | POA: Diagnosis not present

## 2016-09-19 ENCOUNTER — Telehealth: Payer: Self-pay | Admitting: *Deleted

## 2016-09-19 DIAGNOSIS — S83241D Other tear of medial meniscus, current injury, right knee, subsequent encounter: Secondary | ICD-10-CM | POA: Diagnosis not present

## 2016-09-19 NOTE — Telephone Encounter (Signed)
Prior authorization done through covermymeds for patients NITROSTAT.

## 2016-09-20 DIAGNOSIS — S83241D Other tear of medial meniscus, current injury, right knee, subsequent encounter: Secondary | ICD-10-CM | POA: Diagnosis not present

## 2016-09-20 DIAGNOSIS — M25561 Pain in right knee: Secondary | ICD-10-CM | POA: Diagnosis not present

## 2016-09-23 ENCOUNTER — Telehealth: Payer: Self-pay

## 2016-09-23 DIAGNOSIS — M25561 Pain in right knee: Secondary | ICD-10-CM | POA: Diagnosis not present

## 2016-09-23 DIAGNOSIS — S83241D Other tear of medial meniscus, current injury, right knee, subsequent encounter: Secondary | ICD-10-CM | POA: Diagnosis not present

## 2016-09-23 NOTE — Telephone Encounter (Signed)
[facility] will not cover Brand Nitrostat, but will cover Nitroglycerine instead. Local pharmacy notified and is running the Rx through as generic.

## 2016-09-27 ENCOUNTER — Other Ambulatory Visit: Payer: Self-pay

## 2016-09-27 DIAGNOSIS — S83241D Other tear of medial meniscus, current injury, right knee, subsequent encounter: Secondary | ICD-10-CM | POA: Diagnosis not present

## 2016-09-27 DIAGNOSIS — M25561 Pain in right knee: Secondary | ICD-10-CM | POA: Diagnosis not present

## 2016-09-27 MED ORDER — CLOPIDOGREL BISULFATE 75 MG PO TABS: 75.0000 mg | ORAL_TABLET | Freq: Every day | ORAL | 3 refills | Status: DC

## 2016-09-30 DIAGNOSIS — Z79891 Long term (current) use of opiate analgesic: Secondary | ICD-10-CM | POA: Diagnosis not present

## 2016-09-30 DIAGNOSIS — M1612 Unilateral primary osteoarthritis, left hip: Secondary | ICD-10-CM | POA: Diagnosis not present

## 2016-09-30 DIAGNOSIS — G4700 Insomnia, unspecified: Secondary | ICD-10-CM | POA: Diagnosis not present

## 2016-09-30 DIAGNOSIS — M25561 Pain in right knee: Secondary | ICD-10-CM | POA: Diagnosis not present

## 2016-09-30 DIAGNOSIS — G894 Chronic pain syndrome: Secondary | ICD-10-CM | POA: Diagnosis not present

## 2016-09-30 DIAGNOSIS — F329 Major depressive disorder, single episode, unspecified: Secondary | ICD-10-CM | POA: Diagnosis not present

## 2016-09-30 DIAGNOSIS — S83241D Other tear of medial meniscus, current injury, right knee, subsequent encounter: Secondary | ICD-10-CM | POA: Diagnosis not present

## 2016-09-30 DIAGNOSIS — M47817 Spondylosis without myelopathy or radiculopathy, lumbosacral region: Secondary | ICD-10-CM | POA: Diagnosis not present

## 2016-09-30 DIAGNOSIS — M7060 Trochanteric bursitis, unspecified hip: Secondary | ICD-10-CM | POA: Diagnosis not present

## 2016-09-30 DIAGNOSIS — G609 Hereditary and idiopathic neuropathy, unspecified: Secondary | ICD-10-CM | POA: Diagnosis not present

## 2016-10-04 DIAGNOSIS — M7138 Other bursal cyst, other site: Secondary | ICD-10-CM | POA: Diagnosis not present

## 2016-10-11 DIAGNOSIS — M25561 Pain in right knee: Secondary | ICD-10-CM | POA: Diagnosis not present

## 2016-10-11 DIAGNOSIS — S83241D Other tear of medial meniscus, current injury, right knee, subsequent encounter: Secondary | ICD-10-CM | POA: Diagnosis not present

## 2016-10-21 DIAGNOSIS — M25551 Pain in right hip: Secondary | ICD-10-CM | POA: Diagnosis not present

## 2016-10-21 DIAGNOSIS — S83241D Other tear of medial meniscus, current injury, right knee, subsequent encounter: Secondary | ICD-10-CM | POA: Diagnosis not present

## 2016-10-21 DIAGNOSIS — M5416 Radiculopathy, lumbar region: Secondary | ICD-10-CM | POA: Diagnosis not present

## 2016-10-21 DIAGNOSIS — M25552 Pain in left hip: Secondary | ICD-10-CM | POA: Diagnosis not present

## 2016-10-21 DIAGNOSIS — M48062 Spinal stenosis, lumbar region with neurogenic claudication: Secondary | ICD-10-CM | POA: Diagnosis not present

## 2016-10-21 DIAGNOSIS — M47816 Spondylosis without myelopathy or radiculopathy, lumbar region: Secondary | ICD-10-CM | POA: Diagnosis not present

## 2016-10-29 DIAGNOSIS — M5416 Radiculopathy, lumbar region: Secondary | ICD-10-CM | POA: Diagnosis not present

## 2016-10-29 DIAGNOSIS — I10 Essential (primary) hypertension: Secondary | ICD-10-CM | POA: Diagnosis not present

## 2016-10-29 DIAGNOSIS — Z6830 Body mass index (BMI) 30.0-30.9, adult: Secondary | ICD-10-CM | POA: Diagnosis not present

## 2016-11-01 DIAGNOSIS — G4700 Insomnia, unspecified: Secondary | ICD-10-CM | POA: Diagnosis not present

## 2016-11-01 DIAGNOSIS — M1612 Unilateral primary osteoarthritis, left hip: Secondary | ICD-10-CM | POA: Diagnosis not present

## 2016-11-01 DIAGNOSIS — G609 Hereditary and idiopathic neuropathy, unspecified: Secondary | ICD-10-CM | POA: Diagnosis not present

## 2016-11-01 DIAGNOSIS — G894 Chronic pain syndrome: Secondary | ICD-10-CM | POA: Diagnosis not present

## 2016-11-13 ENCOUNTER — Other Ambulatory Visit: Payer: Self-pay | Admitting: Neurosurgery

## 2016-11-13 DIAGNOSIS — Z6830 Body mass index (BMI) 30.0-30.9, adult: Secondary | ICD-10-CM | POA: Diagnosis not present

## 2016-11-13 DIAGNOSIS — I10 Essential (primary) hypertension: Secondary | ICD-10-CM | POA: Diagnosis not present

## 2016-11-13 DIAGNOSIS — M48062 Spinal stenosis, lumbar region with neurogenic claudication: Secondary | ICD-10-CM | POA: Diagnosis not present

## 2016-11-15 ENCOUNTER — Other Ambulatory Visit: Payer: Self-pay

## 2016-11-15 MED ORDER — ROSUVASTATIN CALCIUM 40 MG PO TABS: ORAL_TABLET | ORAL | 2 refills | Status: DC

## 2016-11-19 ENCOUNTER — Inpatient Hospital Stay (HOSPITAL_COMMUNITY)
Admission: RE | Admit: 2016-11-19 | Discharge: 2016-11-19 | Disposition: A | Discharge status: Home / Self Care | Payer: PPO | Source: Ambulatory Visit

## 2016-11-19 NOTE — Progress Notes (Signed)
Pt did not show for scheduled PAT this am. Pt stated that he thought that the appointment was for tomorrow; chart given to surgical scheduler.

## 2016-11-22 ENCOUNTER — Encounter (HOSPITAL_COMMUNITY)
Admission: RE | Admit: 2016-11-22 | Discharge: 2016-11-22 | Disposition: A | Discharge status: Home / Self Care | Payer: PPO | Source: Ambulatory Visit | Attending: Neurosurgery | Admitting: Neurosurgery

## 2016-11-22 ENCOUNTER — Other Ambulatory Visit: Payer: Self-pay

## 2016-11-22 ENCOUNTER — Encounter (HOSPITAL_COMMUNITY): Payer: Self-pay

## 2016-11-22 DIAGNOSIS — G4733 Obstructive sleep apnea (adult) (pediatric): Secondary | ICD-10-CM | POA: Insufficient documentation

## 2016-11-22 DIAGNOSIS — Z01812 Encounter for preprocedural laboratory examination: Secondary | ICD-10-CM | POA: Diagnosis not present

## 2016-11-22 DIAGNOSIS — I2510 Atherosclerotic heart disease of native coronary artery without angina pectoris: Secondary | ICD-10-CM | POA: Diagnosis not present

## 2016-11-22 DIAGNOSIS — G4730 Sleep apnea, unspecified: Secondary | ICD-10-CM

## 2016-11-22 DIAGNOSIS — E785 Hyperlipidemia, unspecified: Secondary | ICD-10-CM | POA: Insufficient documentation

## 2016-11-22 DIAGNOSIS — I447 Left bundle-branch block, unspecified: Secondary | ICD-10-CM | POA: Insufficient documentation

## 2016-11-22 HISTORY — DX: Left bundle-branch block, unspecified: I44.7

## 2016-11-22 HISTORY — PX: JOINT REPLACEMENT: SHX530

## 2016-11-22 HISTORY — DX: Sleep apnea, unspecified: G47.30

## 2016-11-22 LAB — APTT: aPTT: 28 seconds (ref 24–36)

## 2016-11-22 LAB — CBC WITH DIFFERENTIAL/PLATELET
BASOS PCT: 0 %
Basophils Absolute: 0 10*3/uL (ref 0.0–0.1)
EOS ABS: 0.2 10*3/uL (ref 0.0–0.7)
Eosinophils Relative: 2 %
HEMATOCRIT: 39.4 % (ref 39.0–52.0)
Hemoglobin: 13.3 g/dL (ref 13.0–17.0)
LYMPHS ABS: 2.7 10*3/uL (ref 0.7–4.0)
Lymphocytes Relative: 33 %
MCH: 30.9 pg (ref 26.0–34.0)
MCHC: 33.8 g/dL (ref 30.0–36.0)
MCV: 91.6 fL (ref 78.0–100.0)
MONO ABS: 0.8 10*3/uL (ref 0.1–1.0)
MONOS PCT: 9 %
Neutro Abs: 4.6 10*3/uL (ref 1.7–7.7)
Neutrophils Relative %: 56 %
Platelets: 242 10*3/uL (ref 150–400)
RBC: 4.3 MIL/uL (ref 4.22–5.81)
RDW: 12.9 % (ref 11.5–15.5)
WBC: 8.3 10*3/uL (ref 4.0–10.5)

## 2016-11-22 LAB — PROTIME-INR
INR: 0.94
Prothrombin Time: 12.5 seconds (ref 11.4–15.2)

## 2016-11-22 LAB — BASIC METABOLIC PANEL
Anion gap: 7 (ref 5–15)
BUN: 14 mg/dL (ref 6–20)
CALCIUM: 8.9 mg/dL (ref 8.9–10.3)
CHLORIDE: 102 mmol/L (ref 101–111)
CO2: 27 mmol/L (ref 22–32)
CREATININE: 0.73 mg/dL (ref 0.61–1.24)
GFR calc Af Amer: >60 mL/min (ref >60)
GFR calc non Af Amer: >60 mL/min (ref >60)
GLUCOSE: 119 mg/dL — AB (ref 65–99)
Potassium: 4.2 mmol/L (ref 3.5–5.1)
Sodium: 136 mmol/L (ref 135–145)

## 2016-11-22 LAB — SURGICAL PCR SCREEN
MRSA, PCR: NEGATIVE
Staphylococcus aureus: POSITIVE — AB

## 2016-11-22 NOTE — Progress Notes (Addendum)
JME:QASTMHD, Wayne Baltimore, MD  Cardiologist:Dr. Lendell Caprice  EKG:07/22/16 in EPIC  Stress test: 02/25/2012 in EPIC  ECHO:  Pt denies  Cardiac Cath: 09/2009 in Epic under Media  Chest x-ray: pt denies  Pt stopped aspirin and plavix 11/21/16 as instructed by surgeon

## 2016-11-22 NOTE — Pre-Procedure Instructions (Signed)
Wayne [name]  11/22/2016      [place], [place] - [address] [place] [address] Phone: [phone] Fax: [phone]    Your procedure is scheduled on November 26, 2016.  Report to [facility] Admitting at 830 AM.  Call this number if you have problems the morning of surgery:  [phone]   Remember:  Do not eat food or drink liquids after midnight.  Take these medicines the morning of surgery with A SIP OF WATER metoprolol (Toprol XL), duloxetine (Cymbalta), gabapentin (neurontin), oxycodone-acetaminophen (percocet).   Stop Plavix as instructed by your surgeon  7 days prior to surgery STOP taking any Aspirin (unless otherwise instructed by your surgeon), Aleve, Naproxen, Ibuprofen, Motrin, Advil, Goody's, BC's, all herbal medications, fish oil, and all vitamins     Do not wear jewelry, make-up or nail polish.  Do not wear lotions, powders, or perfumes, or deoderant.  Do not shave 48 hours prior to surgery.  Men may shave face and neck.  Do not bring valuables to the hospital.  [facility] is not responsible for any belongings or valuables.  Contacts, dentures or bridgework may not be worn into surgery.  Leave your suitcase in the car.  After surgery it may be brought to your room.  For patients admitted to the hospital, discharge time will be determined by your treatment team.  Patients discharged the day of surgery will not be allowed to drive home.   Special instructions:   [facility]- Preparing For Surgery  Before surgery, you can play an important role. Because skin is not sterile, your skin needs to be as free of germs as possible. You can reduce the number of germs on your skin by washing with CHG (chlorahexidine gluconate) Soap before surgery.  CHG is an antiseptic cleaner which kills germs and bonds with the skin to continue killing germs even after washing.  Please do not use if  you have an allergy to CHG or antibacterial soaps. If your skin becomes reddened/irritated stop using the CHG.  Do not shave (including legs and underarms) for at least 48 hours prior to first CHG shower. It is OK to shave your face.  Please follow these instructions carefully.   1. Shower the NIGHT BEFORE SURGERY and the MORNING OF SURGERY with CHG.   2. If you chose to wash your hair, wash your hair first as usual with your normal shampoo.  3. After you shampoo, rinse your hair and body thoroughly to remove the shampoo.  4. Use CHG as you would any other liquid soap. You can apply CHG directly to the skin and wash gently with a scrungie or a clean washcloth.   5. Apply the CHG Soap to your body ONLY FROM THE NECK DOWN.  Do not use on open wounds or open sores. Avoid contact with your eyes, ears, mouth and genitals (private parts). Wash Face and genitals (private parts)  with your normal soap.  6. Wash thoroughly, paying special attention to the area where your surgery will be performed.  7. Thoroughly rinse your body with warm water from the neck down.  8. DO NOT shower/wash with your normal soap after using and rinsing off the CHG Soap.  9. Pat yourself dry with a CLEAN TOWEL.  10. Wear CLEAN PAJAMAS to bed the night before surgery, wear comfortable clothes the morning of surgery  11. Place CLEAN SHEETS on your bed  the night of your first shower and DO NOT SLEEP WITH PETS.    Day of Surgery: Do not apply any deodorants/lotions. Please wear clean clothes to the hospital/surgery center.     Please read over the following fact sheets that you were given. Pain Booklet, Coughing and Deep Breathing, MRSA Information and Surgical Site Infection Prevention

## 2016-11-25 ENCOUNTER — Encounter (HOSPITAL_COMMUNITY): Payer: Self-pay

## 2016-11-25 NOTE — Progress Notes (Signed)
Anesthesia Chart Review:  Pt is a 66 year old male scheduled for L4-5 laminectomy for facet/synovial cyst on 11/26/2016 with Earnie Larsson, MD  - PCP is Gaynelle Arabian, MD - Cardiologist is Larae Grooms, MD. Last office visit 08/01/16  PMH includes:  CAD (S/p 4 stents 2011), LBBB,  hyperlipidemia, OSA. Never smoker. BMI 31  Medications include: ASA 81mg , plavix lisinopril, metoprolol, rosuvastatin. Pt stopped plavix 11/21/16.   BP (!) 129/58   Pulse 72   Temp 36.8 C   Resp 20   Ht 5\' 11"  (1.803 m)   Wt 221 lb 9.6 oz (100.5 kg)   SpO2 98%   BMI 30.91 kg/m   Preoperative labs reviewed.    EKG 07/22/16: NSR. LBBB  Nuclear stress test 02/25/12:  - post stress myocardial perfusion images show a normal pattern of perfusion in all regions.  Post stress LV normal in size.  - Post stress LV function normal. Post-stress EF 66% - Unremarkable pharmacologic stress test - Normal perfusion study. Essentially a low risk scan.   Cardiac cath 09/27/09:  - LM: large, widely patent - CX: proximal to mid 80% focal lesion - LAD: mild proximal atherosclerosis; mid 50% just before origin of D2.  D1 and D3 are large and patent. Mild atherosclerosis mid to distal LAD - RCA: large, dominant. Long mid lesion up to 80%. Just before PDA and PL bifurcation is focal 95% lesion.  - DES x2 to RCA - DES to LAD and DES to CX on 10/07/09  If no changes, I anticipate pt can proceed with surgery as scheduled.   Willeen Cass, FNP-BC [facility] Short Stay Surgical Center/Anesthesiology Phone: [phone] 11/25/2016 12:30 PM

## 2016-11-26 ENCOUNTER — Encounter (HOSPITAL_COMMUNITY)
Admission: RE | Disposition: A | Discharge status: Home / Self Care | Payer: Self-pay | Source: Ambulatory Visit | Attending: Neurosurgery

## 2016-11-26 ENCOUNTER — Other Ambulatory Visit: Payer: Self-pay

## 2016-11-26 ENCOUNTER — Inpatient Hospital Stay (HOSPITAL_COMMUNITY)
Admission: RE | Admit: 2016-11-26 | Discharge: 2016-11-26 | DRG: 502 | Disposition: A | Discharge status: Home / Self Care | Payer: PPO | Source: Ambulatory Visit | Attending: Neurosurgery | Admitting: Neurosurgery

## 2016-11-26 ENCOUNTER — Ambulatory Visit (HOSPITAL_COMMUNITY): Payer: PPO

## 2016-11-26 ENCOUNTER — Ambulatory Visit (HOSPITAL_COMMUNITY): Payer: PPO | Admitting: Emergency Medicine

## 2016-11-26 ENCOUNTER — Encounter (HOSPITAL_COMMUNITY): Payer: Self-pay | Admitting: *Deleted

## 2016-11-26 DIAGNOSIS — M5416 Radiculopathy, lumbar region: Secondary | ICD-10-CM | POA: Diagnosis not present

## 2016-11-26 DIAGNOSIS — M7138 Other bursal cyst, other site: Secondary | ICD-10-CM | POA: Diagnosis present

## 2016-11-26 DIAGNOSIS — Z419 Encounter for procedure for purposes other than remedying health state, unspecified: Secondary | ICD-10-CM

## 2016-11-26 DIAGNOSIS — Z96651 Presence of right artificial knee joint: Secondary | ICD-10-CM | POA: Diagnosis not present

## 2016-11-26 DIAGNOSIS — I2510 Atherosclerotic heart disease of native coronary artery without angina pectoris: Secondary | ICD-10-CM | POA: Diagnosis present

## 2016-11-26 DIAGNOSIS — G629 Polyneuropathy, unspecified: Secondary | ICD-10-CM | POA: Diagnosis not present

## 2016-11-26 DIAGNOSIS — G4730 Sleep apnea, unspecified: Secondary | ICD-10-CM | POA: Diagnosis not present

## 2016-11-26 DIAGNOSIS — Z9889 Other specified postprocedural states: Secondary | ICD-10-CM

## 2016-11-26 DIAGNOSIS — M4316 Spondylolisthesis, lumbar region: Secondary | ICD-10-CM | POA: Diagnosis not present

## 2016-11-26 DIAGNOSIS — Z85828 Personal history of other malignant neoplasm of skin: Secondary | ICD-10-CM

## 2016-11-26 DIAGNOSIS — I447 Left bundle-branch block, unspecified: Secondary | ICD-10-CM | POA: Diagnosis present

## 2016-11-26 DIAGNOSIS — M4326 Fusion of spine, lumbar region: Secondary | ICD-10-CM | POA: Diagnosis not present

## 2016-11-26 DIAGNOSIS — Z7902 Long term (current) use of antithrombotics/antiplatelets: Secondary | ICD-10-CM | POA: Diagnosis not present

## 2016-11-26 DIAGNOSIS — Z79899 Other long term (current) drug therapy: Secondary | ICD-10-CM

## 2016-11-26 DIAGNOSIS — M48061 Spinal stenosis, lumbar region without neurogenic claudication: Secondary | ICD-10-CM | POA: Diagnosis not present

## 2016-11-26 DIAGNOSIS — E785 Hyperlipidemia, unspecified: Secondary | ICD-10-CM | POA: Diagnosis not present

## 2016-11-26 DIAGNOSIS — M545 Low back pain: Secondary | ICD-10-CM | POA: Diagnosis present

## 2016-11-26 HISTORY — PX: LUMBAR LAMINECTOMY/DECOMPRESSION MICRODISCECTOMY: SHX5026

## 2016-11-26 SURGERY — LUMBAR LAMINECTOMY/DECOMPRESSION MICRODISCECTOMY 1 LEVEL
Anesthesia: General | Site: Back | Laterality: Left

## 2016-11-26 MED ORDER — LACTATED RINGERS IV SOLN
INTRAVENOUS | Status: DC
  Administered 2016-11-26 (×2): via INTRAVENOUS

## 2016-11-26 MED ORDER — OXYCODONE HCL 5 MG PO TABS: 5.0000 mg | ORAL_TABLET | Freq: Once | ORAL | Status: DC | PRN

## 2016-11-26 MED ORDER — PROPOFOL 10 MG/ML IV BOLUS
INTRAVENOUS | Status: DC | PRN
  Administered 2016-11-26: 160 mg via INTRAVENOUS

## 2016-11-26 MED ORDER — THROMBIN (RECOMBINANT) 5000 UNITS EX SOLR
CUTANEOUS | Status: DC | PRN
  Administered 2016-11-26 (×2): 5000 [IU] via TOPICAL

## 2016-11-26 MED ORDER — SODIUM CHLORIDE 0.9 % IR SOLN
Status: DC | PRN
  Administered 2016-11-26: 10:00:00

## 2016-11-26 MED ORDER — METOPROLOL SUCCINATE ER 25 MG PO TB24: 12.5000 mg | ORAL_TABLET | Freq: Two times a day (BID) | ORAL | Status: DC

## 2016-11-26 MED ORDER — ROCURONIUM BROMIDE 100 MG/10ML IV SOLN
INTRAVENOUS | Status: DC | PRN
Start: 2016-11-26 — End: 2016-11-26
  Administered 2016-11-26: 10 mg via INTRAVENOUS
  Administered 2016-11-26: 50 mg via INTRAVENOUS

## 2016-11-26 MED ORDER — LIDOCAINE HCL (CARDIAC) 20 MG/ML IV SOLN
INTRAVENOUS | Status: DC | PRN
  Administered 2016-11-26: 40 mg via INTRAVENOUS

## 2016-11-26 MED ORDER — FENTANYL CITRATE (PF) 250 MCG/5ML IJ SOLN
INTRAMUSCULAR | Status: AC
  Filled 2016-11-26: qty 5

## 2016-11-26 MED ORDER — CHLORHEXIDINE GLUCONATE CLOTH 2 % EX PADS: 6.0000 | MEDICATED_PAD | Freq: Once | CUTANEOUS | Status: DC

## 2016-11-26 MED ORDER — ROSUVASTATIN CALCIUM 20 MG PO TABS
40.0000 mg | ORAL_TABLET | Freq: Every evening | ORAL | Status: DC
  Administered 2016-11-26: 40 mg via ORAL
  Filled 2016-11-26: qty 2

## 2016-11-26 MED ORDER — PROPOFOL 10 MG/ML IV BOLUS
INTRAVENOUS | Status: AC
  Filled 2016-11-26: qty 20

## 2016-11-26 MED ORDER — LISINOPRIL 5 MG PO TABS
5.0000 mg | ORAL_TABLET | Freq: Every evening | ORAL | Status: DC
  Administered 2016-11-26: 5 mg via ORAL
  Filled 2016-11-26: qty 1

## 2016-11-26 MED ORDER — OXYCODONE-ACETAMINOPHEN 5-325 MG PO TABS
1.0000 | ORAL_TABLET | Freq: Four times a day (QID) | ORAL | Status: DC | PRN
  Administered 2016-11-26: 1 via ORAL
  Filled 2016-11-26: qty 1

## 2016-11-26 MED ORDER — GABAPENTIN 800 MG PO TABS: 800.0000 mg | ORAL_TABLET | Freq: Two times a day (BID) | ORAL | Status: DC

## 2016-11-26 MED ORDER — CEFAZOLIN SODIUM-DEXTROSE 2-4 GM/100ML-% IV SOLN
2.0000 g | INTRAVENOUS | Status: AC
  Administered 2016-11-26: 2 g via INTRAVENOUS
  Filled 2016-11-26: qty 100

## 2016-11-26 MED ORDER — MIDAZOLAM HCL 2 MG/2ML IJ SOLN
INTRAMUSCULAR | Status: AC
  Filled 2016-11-26: qty 2

## 2016-11-26 MED ORDER — OXYCODONE HCL 5 MG/5ML PO SOLN: 5.0000 mg | Freq: Once | ORAL | Status: DC | PRN

## 2016-11-26 MED ORDER — OXYCODONE HCL 5 MG PO TABS
5.0000 mg | ORAL_TABLET | Freq: Four times a day (QID) | ORAL | Status: DC | PRN
  Administered 2016-11-26: 5 mg via ORAL
  Filled 2016-11-26: qty 1

## 2016-11-26 MED ORDER — GABAPENTIN 400 MG PO CAPS
800.0000 mg | ORAL_CAPSULE | Freq: Every day | ORAL | Status: DC
  Administered 2016-11-26: 800 mg via ORAL
  Filled 2016-11-26: qty 2

## 2016-11-26 MED ORDER — OXYCODONE-ACETAMINOPHEN 10-325 MG PO TABS: 1.0000 | ORAL_TABLET | Freq: Four times a day (QID) | ORAL | Status: DC | PRN

## 2016-11-26 MED ORDER — DULOXETINE HCL 30 MG PO CPEP
60.0000 mg | ORAL_CAPSULE | Freq: Every day | ORAL | Status: DC
Start: 2016-11-27 — End: 2016-11-27

## 2016-11-26 MED ORDER — ONDANSETRON HCL 4 MG/2ML IJ SOLN
INTRAMUSCULAR | Status: DC | PRN
  Administered 2016-11-26: 4 mg via INTRAVENOUS

## 2016-11-26 MED ORDER — THROMBIN (RECOMBINANT) 5000 UNITS EX SOLR
CUTANEOUS | Status: AC
  Filled 2016-11-26: qty 10000

## 2016-11-26 MED ORDER — OMEGA-3-ACID ETHYL ESTERS 1 G PO CAPS: 1.0000 g | ORAL_CAPSULE | Freq: Every day | ORAL | Status: DC

## 2016-11-26 MED ORDER — NITROGLYCERIN 0.4 MG SL SUBL: 0.4000 mg | SUBLINGUAL_TABLET | SUBLINGUAL | Status: DC | PRN

## 2016-11-26 MED ORDER — KETOROLAC TROMETHAMINE 30 MG/ML IJ SOLN
INTRAMUSCULAR | Status: DC | PRN
  Administered 2016-11-26: 30 mg via INTRAVENOUS

## 2016-11-26 MED ORDER — BUPIVACAINE HCL (PF) 0.25 % IJ SOLN
INTRAMUSCULAR | Status: AC
  Filled 2016-11-26: qty 30

## 2016-11-26 MED ORDER — ONDANSETRON HCL 4 MG/2ML IJ SOLN: 4.0000 mg | Freq: Once | INTRAMUSCULAR | Status: DC | PRN

## 2016-11-26 MED ORDER — FENTANYL CITRATE (PF) 100 MCG/2ML IJ SOLN
INTRAMUSCULAR | Status: DC | PRN
  Administered 2016-11-26: 50 ug via INTRAVENOUS
  Administered 2016-11-26: 100 ug via INTRAVENOUS

## 2016-11-26 MED ORDER — DEXAMETHASONE SODIUM PHOSPHATE 10 MG/ML IJ SOLN
10.0000 mg | INTRAMUSCULAR | Status: AC
  Administered 2016-11-26: 10 mg via INTRAVENOUS
  Filled 2016-11-26: qty 1

## 2016-11-26 MED ORDER — ZOLPIDEM TARTRATE 5 MG PO TABS: 5.0000 mg | ORAL_TABLET | Freq: Every day | ORAL | Status: DC

## 2016-11-26 MED ORDER — EPHEDRINE SULFATE 50 MG/ML IJ SOLN
INTRAMUSCULAR | Status: DC | PRN
  Administered 2016-11-26: 10 mg via INTRAVENOUS
  Administered 2016-11-26: 30 mg via INTRAVENOUS
  Administered 2016-11-26: 10 mg via INTRAVENOUS
  Administered 2016-11-26 (×2): 5 mg via INTRAVENOUS

## 2016-11-26 MED ORDER — MIDAZOLAM HCL 5 MG/5ML IJ SOLN
INTRAMUSCULAR | Status: DC | PRN
  Administered 2016-11-26: 2 mg via INTRAVENOUS

## 2016-11-26 MED ORDER — SUGAMMADEX SODIUM 200 MG/2ML IV SOLN
INTRAVENOUS | Status: DC | PRN
  Administered 2016-11-26: 200 mg via INTRAVENOUS

## 2016-11-26 MED ORDER — GABAPENTIN 400 MG PO CAPS: 1600.0000 mg | ORAL_CAPSULE | Freq: Every day | ORAL | Status: DC

## 2016-11-26 MED ORDER — PHENYLEPHRINE HCL 10 MG/ML IJ SOLN
INTRAVENOUS | Status: DC | PRN
  Administered 2016-11-26: 10 ug/min via INTRAVENOUS

## 2016-11-26 MED ORDER — HYDROMORPHONE HCL 1 MG/ML IJ SOLN: 0.2500 mg | INTRAMUSCULAR | Status: DC | PRN

## 2016-11-26 MED ORDER — OXYCODONE-ACETAMINOPHEN 5-325 MG PO TABS: 1.0000 | ORAL_TABLET | Freq: Four times a day (QID) | ORAL | 0 refills | Status: DC | PRN

## 2016-11-26 MED ORDER — HEMOSTATIC AGENTS (NO CHARGE) OPTIME
TOPICAL | Status: DC | PRN
  Administered 2016-11-26: 1 via TOPICAL

## 2016-11-26 MED ORDER — BUPIVACAINE HCL (PF) 0.25 % IJ SOLN
INTRAMUSCULAR | Status: DC | PRN
  Administered 2016-11-26: 20 mL

## 2016-11-26 SURGICAL SUPPLY — 49 items
BAG DECANTER FOR FLEXI CONT (MISCELLANEOUS) ×2 IMPLANT
BENZOIN TINCTURE PRP APPL 2/3 (GAUZE/BANDAGES/DRESSINGS) ×2 IMPLANT
BLADE CLIPPER SURG (BLADE) IMPLANT
BUR CUTTER 7.0 ROUND (BURR) ×2 IMPLANT
CANISTER SUCT 3000ML PPV (MISCELLANEOUS) ×2 IMPLANT
CARTRIDGE OIL MAESTRO DRILL (MISCELLANEOUS) ×1 IMPLANT
DECANTER SPIKE VIAL GLASS SM (MISCELLANEOUS) ×2 IMPLANT
DERMABOND ADVANCED (GAUZE/BANDAGES/DRESSINGS) ×1
DERMABOND ADVANCED .7 DNX12 (GAUZE/BANDAGES/DRESSINGS) ×1 IMPLANT
DIFFUSER DRILL AIR PNEUMATIC (MISCELLANEOUS) ×2 IMPLANT
DRAPE HALF SHEET 40X57 (DRAPES) IMPLANT
DRAPE LAPAROTOMY 100X72X124 (DRAPES) ×2 IMPLANT
DRAPE MICROSCOPE LEICA (MISCELLANEOUS) ×4 IMPLANT
DRAPE POUCH INSTRU U-SHP 10X18 (DRAPES) ×2 IMPLANT
DRAPE SURG 17X23 STRL (DRAPES) ×4 IMPLANT
DRSG OPSITE POSTOP 4X6 (GAUZE/BANDAGES/DRESSINGS) ×2 IMPLANT
DURAPREP 26ML APPLICATOR (WOUND CARE) ×2 IMPLANT
ELECT REM PT RETURN 9FT ADLT (ELECTROSURGICAL) ×2
ELECTRODE REM PT RTRN 9FT ADLT (ELECTROSURGICAL) ×1 IMPLANT
GAUZE SPONGE 4X4 12PLY STRL (GAUZE/BANDAGES/DRESSINGS) ×2 IMPLANT
GAUZE SPONGE 4X4 16PLY XRAY LF (GAUZE/BANDAGES/DRESSINGS) IMPLANT
GLOVE BIOGEL PI IND STRL 6.5 (GLOVE) ×2 IMPLANT
GLOVE BIOGEL PI INDICATOR 6.5 (GLOVE) ×2
GLOVE ECLIPSE 9.0 STRL (GLOVE) ×2 IMPLANT
GLOVE EXAM NITRILE LRG STRL (GLOVE) IMPLANT
GLOVE EXAM NITRILE XL STR (GLOVE) IMPLANT
GLOVE EXAM NITRILE XS STR PU (GLOVE) IMPLANT
GLOVE INDICATOR 7.0 STRL GRN (GLOVE) ×2 IMPLANT
GOWN STRL REUS W/ TWL LRG LVL3 (GOWN DISPOSABLE) ×1 IMPLANT
GOWN STRL REUS W/ TWL XL LVL3 (GOWN DISPOSABLE) ×1 IMPLANT
GOWN STRL REUS W/TWL 2XL LVL3 (GOWN DISPOSABLE) IMPLANT
GOWN STRL REUS W/TWL LRG LVL3 (GOWN DISPOSABLE) ×1
GOWN STRL REUS W/TWL XL LVL3 (GOWN DISPOSABLE) ×1
KIT BASIN OR (CUSTOM PROCEDURE TRAY) ×2 IMPLANT
KIT ROOM TURNOVER OR (KITS) ×2 IMPLANT
NEEDLE HYPO 22GX1.5 SAFETY (NEEDLE) ×2 IMPLANT
NEEDLE SPNL 22GX3.5 QUINCKE BK (NEEDLE) IMPLANT
NS IRRIG 1000ML POUR BTL (IV SOLUTION) ×2 IMPLANT
OIL CARTRIDGE MAESTRO DRILL (MISCELLANEOUS) ×2
PACK LAMINECTOMY NEURO (CUSTOM PROCEDURE TRAY) ×2 IMPLANT
PAD ARMBOARD 7.5X6 YLW CONV (MISCELLANEOUS) ×6 IMPLANT
RUBBERBAND STERILE (MISCELLANEOUS) ×4 IMPLANT
SPONGE SURGIFOAM ABS GEL SZ50 (HEMOSTASIS) ×2 IMPLANT
STRIP CLOSURE SKIN 1/2X4 (GAUZE/BANDAGES/DRESSINGS) ×2 IMPLANT
SUT VIC AB 2-0 CT1 18 (SUTURE) ×2 IMPLANT
SUT VIC AB 3-0 SH 8-18 (SUTURE) ×2 IMPLANT
TOWEL GREEN STERILE (TOWEL DISPOSABLE) ×2 IMPLANT
TOWEL GREEN STERILE FF (TOWEL DISPOSABLE) ×2 IMPLANT
WATER STERILE IRR 1000ML POUR (IV SOLUTION) ×2 IMPLANT

## 2016-11-26 NOTE — Transfer of Care (Signed)
Immediate Anesthesia Transfer of Care Note  Patient: Wayne [name]  Procedure(s) Performed: Laminectomy for facet/synovial cyst - left - Lumbar four-Lumbar five (Left Back)  Patient Location: PACU  Anesthesia Type:General  Level of Consciousness: awake and patient cooperative  Airway & Oxygen Therapy: Patient Spontanous Breathing and Patient connected to face mask oxygen  Post-op Assessment: Report given to RN and Post -op Vital signs reviewed and stable  Post vital signs: Reviewed and stable  Last Vitals:  Vitals:   11/26/16 0859 11/26/16 1159  BP: 140/80   Pulse: (!) 58   Resp: 18   Temp: 36.7 C (P) 36.8 C  SpO2: 95%     Last Pain:  Vitals:   11/26/16 0936  TempSrc:   PainSc: 4          Complications: No apparent anesthesia complications

## 2016-11-26 NOTE — Discharge Summary (Signed)
Physician Discharge Summary  Patient ID: Wayne [name] MRN: [id number] DOB/AGE: Apr 24, 1950 66 y.o.  Admit date: 11/26/2016 Discharge date: 11/26/2016  Admission Diagnoses:  Discharge Diagnoses:  Active Problems:   S/P laminectomy   Discharged Condition: good  Hospital Course: She came into the hospital where he underwent an uncomplicated lumbar laminotomy and resection of synovial cyst.  Postoperatively doing very well.  Preoperative pain numbness and weakness resolved.  Ambulating without difficulty.  Ready for discharge home.  Consults:   Significant Diagnostic Studies:   Treatments:   Discharge Exam: Blood pressure (!) 123/56, pulse 78, temperature 98.4 F (36.9 C), resp. rate 16, height 5\' 11"  (1.803 m), weight 100.5 kg (221 lb 9.6 oz), SpO2 96 %. Awake and alert.  Oriented and appropriate.  Motor and sensory function intact.  Wound clean and dry.  Chest and abdomen benign.  Disposition: 01-Home or Self Care   Allergies as of 11/26/2016      Reactions   Mirapex [pramipexole Dihydrochloride] Other (See Comments)   Patient does not remember    Trazodone And Nefazodone Other (See Comments)   DROWSY      Medication List    TAKE these medications   aspirin EC 81 MG tablet Take 1 tablet (81 mg total) by mouth daily.   clopidogrel 75 MG tablet Commonly known as:  PLAVIX Take 1 tablet (75 mg total) by mouth daily with breakfast.   DULoxetine 60 MG capsule Commonly known as:  CYMBALTA Take 60 mg by mouth daily.   Fish Oil 1000 MG Caps Take 2 capsules (2,000 mg total) by mouth daily. What changed:  when to take this   gabapentin 800 MG tablet Commonly known as:  NEURONTIN Take 800-1,600 mg 2 (two) times daily by mouth. Takes 1 tablet in the morning and 2 tablets at bedtime   lisinopril 5 MG tablet Commonly known as:  PRINIVIL,ZESTRIL TAKE 1 TABLET (5 MG TOTAL) BY MOUTH DAILY. What changed:    how much to take  how to take this  when to take  this  additional instructions   metoprolol succinate 25 MG 24 hr tablet Commonly known as:  TOPROL-XL TAKE 1/2 TABLET BY MOUTH 2 TIMES DAILY What changed:    how much to take  how to take this  when to take this  additional instructions   nitroGLYCERIN 0.4 MG SL tablet Commonly known as:  NITROSTAT Place 1 tablet (0.4 mg total) under the tongue every 5 (five) minutes as needed for chest pain.   oxyCODONE-acetaminophen 10-325 MG tablet Commonly known as:  PERCOCET Take 1 tablet every 6 (six) hours as needed by mouth for pain. What changed:  Another medication with the same name was added. Make sure you understand how and when to take each.   oxyCODONE-acetaminophen 5-325 MG tablet Commonly known as:  PERCOCET/ROXICET Take 1 tablet every 6 (six) hours as needed by mouth for moderate pain. What changed:  You were already taking a medication with the same name, and this prescription was added. Make sure you understand how and when to take each.   rosuvastatin 40 MG tablet Commonly known as:  CRESTOR TAKE 1 TABLET (40 MG TOTAL) BY MOUTH DAILY. What changed:    how much to take  how to take this  when to take this  additional instructions   zolpidem 5 MG tablet Commonly known as:  AMBIEN Take 5-10 mg at bedtime by mouth. Depends on insomnia if takes 5-10 mg  Signed: Vinay Ertl A 11/26/2016, 6:39 PM

## 2016-11-26 NOTE — Brief Op Note (Signed)
11/26/2016  11:45 AM  PATIENT:  Wayne [name]  66 y.o. male  PRE-OPERATIVE DIAGNOSIS:  Stenosis - synovial cyst  POST-OPERATIVE DIAGNOSIS:  Stenosis - synovial cyst  PROCEDURE:  Procedure(s): Laminectomy for facet/synovial cyst - left - Lumbar four-Lumbar five (Left)  SURGEON:  Surgeon(s) and Role:    Earnie Larsson, MD - Primary  PHYSICIAN ASSISTANT:   ASSISTANTS:    ANESTHESIA:   general  EBL:  100 mL   BLOOD ADMINISTERED:none  DRAINS: none   LOCAL MEDICATIONS USED:  MARCAINE     SPECIMEN:  No Specimen  DISPOSITION OF SPECIMEN:  N/A  COUNTS:  YES  TOURNIQUET:  * No tourniquets in log *  DICTATION: .Dragon Dictation  PLAN OF CARE: Admit for overnight observation  PATIENT DISPOSITION:  PACU - hemodynamically stable.   Delay start of Pharmacological VTE agent (>24hrs) due to surgical blood loss or risk of bleeding: yes

## 2016-11-26 NOTE — Progress Notes (Signed)
Discharged instructions/education/AVS/Rx given to patient and he verbalized understanding. Pain is mild, no drainage, no swelling, no redness noted on incision site. MAE well and been walking up and down the hallway with no complaints. Voiding and emptying his bladder well. Ate his dinner and swallowing well. Patient waiting for his wife for transport.

## 2016-11-26 NOTE — Anesthesia Postprocedure Evaluation (Signed)
Anesthesia Post Note  Patient: Wayne [name]  Procedure(s) Performed: Laminectomy for facet/synovial cyst - left - Lumbar four-Lumbar five (Left Back)     Patient location during evaluation: PACU Anesthesia Type: General Level of consciousness: awake, awake and alert and oriented Pain management: pain level controlled Vital Signs Assessment: post-procedure vital signs reviewed and stable Respiratory status: spontaneous breathing, nonlabored ventilation and respiratory function stable Cardiovascular status: blood pressure returned to baseline Postop Assessment: no headache Anesthetic complications: no    Last Vitals:  Vitals:   11/26/16 1300 11/26/16 1635  BP: 137/75 (!) 123/56  Pulse:  78  Resp: 18 16  Temp: 36.6 C 36.9 C  SpO2: 95% 96%    Last Pain:  Vitals:   11/26/16 1756  TempSrc:   PainSc: 6                  Hidaya Daniel COKER

## 2016-11-26 NOTE — Op Note (Signed)
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  Asst. Surgeon: none  Anesthesia: General  Indication:66 year old male with back and left lower extremity pain. Seizures and weakness consistent with a left-sided L5 radiculopathy which is failed conservative management. Workup demonstrates evidence of a significant left-sided L4-5 synovial cyst with marked nerve root compression. Patient presents now for surgical resection.  Operative note:after induction anesthesia, patient position prone onto Wilson frame and a properly padded. Lumbar region prepped and draped sterilely. Incision made overlying L4-5. Dissection performed on the left. Retractor placed. X-ray taken. Level confirmed. Laminotomy performed using high-speed drill and Kerrison rongeurs. Ligament flavum elevated and resected. Synovial cyst was encountered. This is densely adherent to the underlying thecal sac and left L5 nerve root. Michael strip brought Warden/ranger. The cyst was carefully and circumferentially dissected from the thecal sac and L5 nerve root. The cyst was completely resected at this point. Foraminotomies were completed on the course exiting L4 and L5 nerve roots on the left side. There is no evidence of injury to thecal sac and nerve roots. Disc spaces inspected and found to be flat. Wounds and irrigated ntibioticsolution. Gelfoam was placed topically for hemostasis. Wounds and close in layers with Vicryl sutures. Steri-Strips and sterile dressing was applied.o apparent palpitations. Patient tolerated the procedure well and he returns to the recovery room postop.

## 2016-11-26 NOTE — Anesthesia Procedure Notes (Addendum)
Procedure Name: Intubation Date/Time: 11/26/2016 10:35 AM Performed by: Lance Coon, CRNA Pre-anesthesia Checklist: Patient identified, Emergency Drugs available, Suction available and Patient being monitored Patient Re-evaluated:Patient Re-evaluated prior to induction Oxygen Delivery Method: Circle System Utilized Preoxygenation: Pre-oxygenation with 100% oxygen Induction Type: IV induction Ventilation: Mask ventilation without difficulty Laryngoscope Size: Mac and 4 Grade View: Grade II Tube type: Oral Tube size: 7.5 mm Number of attempts: 2 Airway Equipment and Method: Stylet and Oral airway Placement Confirmation: ETT inserted through vocal cords under direct vision,  positive ETCO2 and breath sounds checked- equal and bilateral Secured at: 21 cm Tube secured with: Tape Dental Injury: Teeth and Oropharynx as per pre-operative assessment  Comments: Intubation per Vickii Penna, SRNA.

## 2016-11-26 NOTE — H&P (Signed)
Wayne [name] is an 66 y.o. male.   Chief Complaint: Left leg pain. HPI: 66 year old male with severe left lower extremity pain. Workup demonstrates evidence of significant left-sided L4-5 synovial cyst with nerve root compression. Patient presents now for decompressive surgery.  Past Medical History:  Diagnosis Date  . Basal cell carcinoma    MOHS  . Coronary artery disease 9/11   S/P MI and PTCA--X 4 STENTS  . Diverticulosis   . Hip pain    CHRONIC  . Hyperlipidemia   . LBBB (left bundle branch block)   . Neuropathy    IDIOPATHIC  . Sleep apnea 11/22/2016   does not wear CPAP    Past Surgical History:  Procedure Laterality Date  . CARDIAC CATHETERIZATION  09/11  . CHOLECYSTECTOMY    . JOINT REPLACEMENT  11/22/2016   right total knee 10/2016  . RIGHT SHOULDER BONE TUMOR    . SLIPPED CAPITAL FEMORAL EPIPHYSIS PINNING      Family History  Problem Relation Age of Onset  . Cancer Mother   . Lung cancer Father    Social History:  reports that  has never smoked. he has never used smokeless tobacco. He reports that he does not drink alcohol or use drugs.  Allergies:  Allergies  Allergen Reactions  . Mirapex [Pramipexole Dihydrochloride] Other (See Comments)    Patient does not remember   . Trazodone And Nefazodone Other (See Comments)    DROWSY    Medications Prior to Admission  Medication Sig Dispense Refill  . aspirin EC 81 MG tablet Take 1 tablet (81 mg total) by mouth daily.    . clopidogrel (PLAVIX) 75 MG tablet Take 1 tablet (75 mg total) by mouth daily with breakfast. 90 tablet 3  . DULoxetine (CYMBALTA) 60 MG capsule Take 60 mg by mouth daily.    [name] gabapentin (NEURONTIN) 600 MG tablet Take 600-1,200 mg 3 (three) times daily by mouth. Take    . gabapentin (NEURONTIN) 800 MG tablet Take 800-1,600 mg 2 (two) times daily by mouth. Takes 1 tablet in the morning and 2 tablets at bedtime  2  . lisinopril (PRINIVIL,ZESTRIL) 5 MG tablet TAKE 1 TABLET (5 MG TOTAL) BY  MOUTH DAILY. (Patient taking differently: Take 5 mg every evening by mouth. ) 90 tablet 0  . metoprolol succinate (TOPROL-XL) 25 MG 24 hr tablet TAKE 1/2 TABLET BY MOUTH 2 TIMES DAILY (Patient taking differently: Take 12.5 mg 2 (two) times daily by mouth. TAKE 1/2 TABLET BY MOUTH 2 TIMES DAILY) 90 tablet 0  . Omega-3 Fatty Acids (FISH OIL) 1000 MG CAPS Take 2 capsules (2,000 mg total) by mouth daily. (Patient taking differently: Take 2,000 mg 2 (two) times daily by mouth. )  0  . oxyCODONE-acetaminophen (PERCOCET) 10-325 MG per tablet Take 1 tablet every 6 (six) hours as needed by mouth for pain.     . rosuvastatin (CRESTOR) 40 MG tablet TAKE 1 TABLET (40 MG TOTAL) BY MOUTH DAILY. (Patient taking differently: Take 40 mg every evening by mouth. ) 90 tablet 2  . zolpidem (AMBIEN) 5 MG tablet Take 5-10 mg at bedtime by mouth. Depends on insomnia if takes 5-10 mg  3  . nitroGLYCERIN (NITROSTAT) 0.4 MG SL tablet Place 1 tablet (0.4 mg total) under the tongue every 5 (five) minutes as needed for chest pain. 25 tablet 5    No results found for this or any previous visit (from the past 48 hour(s)). No results found.  Pertinent items noted  in HPI and remainder of comprehensive ROS otherwise negative.  Blood pressure 140/80, pulse (!) 58, temperature 98 F (36.7 C), temperature source Oral, resp. rate 18, height 5\' 11"  (1.803 m), weight 100.5 kg (221 lb 9.6 oz), SpO2 95 %.  Patient is awake and alert. He is oriented and appropriate. Cranial nerve function intact. Motor and sensory function intact except for some mild dorsiflexion weakness on the left. Sensory examination with decreased sensation to pinprick and light touch in his left L5 dermatome. Straight raising positive on the left negative on the right. Exam is head ears eyes and throat are marked. Chest and abdomen benign. Extremities are free from injury deformity. 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 Plan left L4-5 laminotomy and resection of synovial cyst. Risks and benefits of been explained. Patient wishes to proceed.  Yoshiaki Kreuser A 11/26/2016, 10:03 AM

## 2016-11-26 NOTE — Discharge Instructions (Signed)

## 2016-11-26 NOTE — Anesthesia Preprocedure Evaluation (Signed)

## 2016-11-27 ENCOUNTER — Encounter (HOSPITAL_COMMUNITY): Payer: Self-pay | Admitting: Neurosurgery

## 2016-12-02 DIAGNOSIS — G4700 Insomnia, unspecified: Secondary | ICD-10-CM | POA: Diagnosis not present

## 2016-12-02 DIAGNOSIS — M1612 Unilateral primary osteoarthritis, left hip: Secondary | ICD-10-CM | POA: Diagnosis not present

## 2016-12-02 DIAGNOSIS — G894 Chronic pain syndrome: Secondary | ICD-10-CM | POA: Diagnosis not present

## 2016-12-02 DIAGNOSIS — G609 Hereditary and idiopathic neuropathy, unspecified: Secondary | ICD-10-CM | POA: Diagnosis not present

## 2016-12-13 ENCOUNTER — Ambulatory Visit: Payer: PPO | Admitting: Pulmonary Disease

## 2016-12-13 ENCOUNTER — Encounter: Payer: Self-pay | Admitting: Pulmonary Disease

## 2016-12-13 VITALS — BP 136/70 | HR 71 | Ht 71.0 in | Wt 219.6 lb

## 2016-12-13 DIAGNOSIS — G4733 Obstructive sleep apnea (adult) (pediatric): Secondary | ICD-10-CM

## 2016-12-13 DIAGNOSIS — I10 Essential (primary) hypertension: Secondary | ICD-10-CM

## 2016-12-13 NOTE — Assessment & Plan Note (Signed)
Given excessive daytime somnolence, narrow pharyngeal exam, witnessed apneas & loud snoring, obstructive sleep apnea is very likely & an overnight polysomnogram will be scheduled as a home study. The pathophysiology of obstructive sleep apnea , it's cardiovascular consequences & modes of treatment including CPAP were discused with the patient in detail & they evidenced understanding.  Pretest probability is intermediate. We will try to track down his old sleep study from PCP. If he has more than 15 events per hour we will consider treatment with CPAP.  He will likely need a full face mask or chinstrap

## 2016-12-13 NOTE — Patient Instructions (Signed)
Home sleep study will be scheduled

## 2016-12-13 NOTE — Assessment & Plan Note (Signed)
Discussed cardiovascular effects of OSA on hypertension and heart disease

## 2016-12-13 NOTE — Progress Notes (Signed)
Subjective:    Patient ID: Wayne [name], male    DOB: 1950-12-25, 66 y.o.   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 He had a sleep study 5 years ago and was told that he had OSA, was tried on a nasal mask but could not tolerate this since he sleeps on his stomach in sleeps with his mouth open . His symptoms have worsened over the past few years and he would like for reevaluation. Wife is noted loud snoring and sleeps in a different room. He has had a rough year with surgery for his on his knees for meniscus tear and on his back for nerve impingement and he ambulates with a cane. His pain has bothered him somewhat and interrupted his sleep for the last year he has been using Ambien 5 mg 1-2 tablets at bedtime with good results.  Epworth sleepiness score is 9 and he reports sleepiness while watching TV or lying down to rest in the afternoons. Bedtime is around 11 PM, sleep latency is about 20 minutes, he sleeps on his side and ends up on his stomach with 2 pillows, reports 1-2 nocturnal awakenings including nocturia and is out of bed by 7 AM feeling rested with occasional dryness of mouth but denies a headache. On Mondays and Fridays he naps for about 2 hours and wakes up feeling rested. He has gained about 20 pounds in the last 5 years. There is no history suggestive of cataplexy, sleep paralysis or parasomnias   He has coronary artery disease with 4 stents placed in 2010 and is on medical therapy.   Past Medical History:  Diagnosis Date  . Basal cell carcinoma    MOHS  . Coronary artery disease 9/11   S/P MI and PTCA--X 4 STENTS  . Diverticulosis   . Hip pain    CHRONIC  . Hyperlipidemia   . LBBB (left bundle branch block)   . Neuropathy    IDIOPATHIC  . Sleep apnea 11/22/2016   does not wear CPAP   Past Surgical History:  Procedure Laterality Date  . CARDIAC CATHETERIZATION  09/11  . CHOLECYSTECTOMY    . JOINT  REPLACEMENT  11/22/2016   right total knee 10/2016  . LUMBAR LAMINECTOMY/DECOMPRESSION MICRODISCECTOMY Left 11/26/2016   Procedure: Laminectomy for facet/synovial cyst - left - Lumbar four-Lumbar five;  Surgeon: Earnie Larsson, MD;  Location: [facility];  Service: Neurosurgery;  Laterality: Left;  . RIGHT SHOULDER BONE TUMOR    . SLIPPED CAPITAL FEMORAL EPIPHYSIS PINNING      Allergies  Allergen Reactions  . Mirapex [Pramipexole Dihydrochloride] Other (See Comments)    Patient does not remember   . Trazodone And Nefazodone Other (See Comments)    DROWSY    Social History   Socioeconomic History  . Marital status: Married    Spouse name: Not on file  . Number of children: Not on file  . Years of education: Not on file  . Highest education level: Not on file  Social Needs  . Financial resource strain: Not on file  . Food insecurity - worry: Not on file  . Food insecurity - inability: Not on file  . Transportation needs - medical: Not on file  . Transportation needs - non-medical: Not on file  Occupational History  . Not on file  Tobacco Use  . Smoking status: Never Smoker  . Smokeless tobacco: Never Used  Substance and Sexual Activity  . Alcohol use:  No    Frequency: Never  . Drug use: No  . Sexual activity: Not on file  Other Topics Concern  . Not on file  Social History Narrative  . Not on file     Family History  Problem Relation Age of Onset  . Cancer Mother   . Lung cancer Father      Review of Systems  Constitutional: negative for anorexia, fevers and sweats  Eyes: negative for irritation, redness and visual disturbance  Ears, nose, mouth, throat, and face: negative for earaches, epistaxis, nasal congestion and sore throat  Respiratory: negative for cough, dyspnea on exertion, sputum and wheezing  Cardiovascular: negative for chest pain, dyspnea, lower extremity edema, orthopnea, palpitations and syncope  Gastrointestinal: negative for abdominal pain,  constipation, diarrhea, melena, nausea and vomiting  Genitourinary:negative for dysuria, frequency and hematuria  Hematologic/lymphatic: negative for bleeding, easy bruising and lymphadenopathy  Musculoskeletal:negative for arthralgias, muscle weakness and stiff joints  Neurological: negative for coordination problems, gait problems, headaches and weakness  Endocrine: negative for diabetic symptoms including polydipsia, polyuria and weight loss     Objective:   Physical Exam  Gen. Pleasant, well-nourished, in no distress, normal affect ENT - class 2 airway, no post nasal drip Neck: No JVD, no thyromegaly, no carotid bruits Lungs: no use of accessory muscles, no dullness to percussion, clear without rales or rhonchi  Cardiovascular: Rhythm regular, heart sounds  normal, no murmurs or gallops, no peripheral edema Abdomen: soft and non-tender, no hepatosplenomegaly, BS normal. Musculoskeletal: No deformities, no cyanosis or clubbing Neuro:  alert, non focal       Assessment & Plan:

## 2016-12-26 ENCOUNTER — Other Ambulatory Visit: Payer: Self-pay | Admitting: Interventional Cardiology

## 2016-12-30 DIAGNOSIS — G4700 Insomnia, unspecified: Secondary | ICD-10-CM | POA: Diagnosis not present

## 2016-12-30 DIAGNOSIS — G894 Chronic pain syndrome: Secondary | ICD-10-CM | POA: Diagnosis not present

## 2016-12-30 DIAGNOSIS — G609 Hereditary and idiopathic neuropathy, unspecified: Secondary | ICD-10-CM | POA: Diagnosis not present

## 2016-12-30 DIAGNOSIS — M1612 Unilateral primary osteoarthritis, left hip: Secondary | ICD-10-CM | POA: Diagnosis not present

## 2017-01-08 ENCOUNTER — Other Ambulatory Visit: Payer: Self-pay | Admitting: Interventional Cardiology

## 2017-01-11 DIAGNOSIS — G4733 Obstructive sleep apnea (adult) (pediatric): Secondary | ICD-10-CM | POA: Diagnosis not present

## 2017-01-12 DIAGNOSIS — G4733 Obstructive sleep apnea (adult) (pediatric): Secondary | ICD-10-CM | POA: Diagnosis not present

## 2017-01-16 ENCOUNTER — Telehealth: Payer: Self-pay | Admitting: Pulmonary Disease

## 2017-01-16 DIAGNOSIS — G4733 Obstructive sleep apnea (adult) (pediatric): Secondary | ICD-10-CM

## 2017-01-16 NOTE — Telephone Encounter (Signed)
Per RA, HST showed severe OSA with 43 events per hour. Recommends auto cpap 5-20cm, full face mask of choice. DL/OV in 4 weeks.

## 2017-01-17 ENCOUNTER — Other Ambulatory Visit: Payer: Self-pay | Admitting: *Deleted

## 2017-01-17 DIAGNOSIS — G4733 Obstructive sleep apnea (adult) (pediatric): Secondary | ICD-10-CM

## 2017-01-17 NOTE — Telephone Encounter (Signed)
Left message for patient to call back  

## 2017-01-17 NOTE — Telephone Encounter (Signed)
Spoke with patient. He is aware of HST results. Wishes to proceed with CPAP. Advised patient that I would place order today.   Nothing else needed at time of call.

## 2017-02-03 DIAGNOSIS — G609 Hereditary and idiopathic neuropathy, unspecified: Secondary | ICD-10-CM | POA: Diagnosis not present

## 2017-02-03 DIAGNOSIS — G4700 Insomnia, unspecified: Secondary | ICD-10-CM | POA: Diagnosis not present

## 2017-02-03 DIAGNOSIS — M1612 Unilateral primary osteoarthritis, left hip: Secondary | ICD-10-CM | POA: Diagnosis not present

## 2017-02-03 DIAGNOSIS — G894 Chronic pain syndrome: Secondary | ICD-10-CM | POA: Diagnosis not present

## 2017-02-24 DIAGNOSIS — Z85828 Personal history of other malignant neoplasm of skin: Secondary | ICD-10-CM | POA: Diagnosis not present

## 2017-02-24 DIAGNOSIS — L718 Other rosacea: Secondary | ICD-10-CM | POA: Diagnosis not present

## 2017-03-03 DIAGNOSIS — M7062 Trochanteric bursitis, left hip: Secondary | ICD-10-CM | POA: Diagnosis not present

## 2017-03-03 DIAGNOSIS — M25561 Pain in right knee: Secondary | ICD-10-CM | POA: Diagnosis not present

## 2017-03-03 DIAGNOSIS — M7061 Trochanteric bursitis, right hip: Secondary | ICD-10-CM | POA: Diagnosis not present

## 2017-03-03 DIAGNOSIS — G4733 Obstructive sleep apnea (adult) (pediatric): Secondary | ICD-10-CM | POA: Diagnosis not present

## 2017-03-10 DIAGNOSIS — G609 Hereditary and idiopathic neuropathy, unspecified: Secondary | ICD-10-CM | POA: Diagnosis not present

## 2017-03-10 DIAGNOSIS — G4700 Insomnia, unspecified: Secondary | ICD-10-CM | POA: Diagnosis not present

## 2017-03-10 DIAGNOSIS — G894 Chronic pain syndrome: Secondary | ICD-10-CM | POA: Diagnosis not present

## 2017-03-10 DIAGNOSIS — M1612 Unilateral primary osteoarthritis, left hip: Secondary | ICD-10-CM | POA: Diagnosis not present

## 2017-03-31 DIAGNOSIS — G4733 Obstructive sleep apnea (adult) (pediatric): Secondary | ICD-10-CM | POA: Diagnosis not present

## 2017-04-11 DIAGNOSIS — G4700 Insomnia, unspecified: Secondary | ICD-10-CM | POA: Diagnosis not present

## 2017-04-11 DIAGNOSIS — I25119 Atherosclerotic heart disease of native coronary artery with unspecified angina pectoris: Secondary | ICD-10-CM | POA: Diagnosis not present

## 2017-04-11 DIAGNOSIS — I10 Essential (primary) hypertension: Secondary | ICD-10-CM | POA: Diagnosis not present

## 2017-04-11 DIAGNOSIS — G4730 Sleep apnea, unspecified: Secondary | ICD-10-CM | POA: Diagnosis not present

## 2017-04-11 DIAGNOSIS — G8929 Other chronic pain: Secondary | ICD-10-CM | POA: Diagnosis not present

## 2017-04-11 DIAGNOSIS — E785 Hyperlipidemia, unspecified: Secondary | ICD-10-CM | POA: Diagnosis not present

## 2017-04-11 DIAGNOSIS — E669 Obesity, unspecified: Secondary | ICD-10-CM | POA: Diagnosis not present

## 2017-04-11 DIAGNOSIS — I252 Old myocardial infarction: Secondary | ICD-10-CM | POA: Diagnosis not present

## 2017-04-11 DIAGNOSIS — G629 Polyneuropathy, unspecified: Secondary | ICD-10-CM | POA: Diagnosis not present

## 2017-04-11 DIAGNOSIS — R69 Illness, unspecified: Secondary | ICD-10-CM | POA: Diagnosis not present

## 2017-04-13 DIAGNOSIS — M1612 Unilateral primary osteoarthritis, left hip: Secondary | ICD-10-CM | POA: Insufficient documentation

## 2017-04-14 DIAGNOSIS — G4700 Insomnia, unspecified: Secondary | ICD-10-CM | POA: Diagnosis not present

## 2017-04-14 DIAGNOSIS — M1711 Unilateral primary osteoarthritis, right knee: Secondary | ICD-10-CM | POA: Diagnosis not present

## 2017-04-14 DIAGNOSIS — G609 Hereditary and idiopathic neuropathy, unspecified: Secondary | ICD-10-CM | POA: Diagnosis not present

## 2017-04-14 DIAGNOSIS — M1612 Unilateral primary osteoarthritis, left hip: Secondary | ICD-10-CM | POA: Diagnosis not present

## 2017-04-14 DIAGNOSIS — G894 Chronic pain syndrome: Secondary | ICD-10-CM | POA: Diagnosis not present

## 2017-04-14 DIAGNOSIS — M160 Bilateral primary osteoarthritis of hip: Secondary | ICD-10-CM | POA: Diagnosis not present

## 2017-04-21 DIAGNOSIS — M1711 Unilateral primary osteoarthritis, right knee: Secondary | ICD-10-CM | POA: Diagnosis not present

## 2017-04-25 DIAGNOSIS — D1801 Hemangioma of skin and subcutaneous tissue: Secondary | ICD-10-CM | POA: Diagnosis not present

## 2017-04-25 DIAGNOSIS — D485 Neoplasm of uncertain behavior of skin: Secondary | ICD-10-CM | POA: Diagnosis not present

## 2017-04-25 DIAGNOSIS — Z85828 Personal history of other malignant neoplasm of skin: Secondary | ICD-10-CM | POA: Diagnosis not present

## 2017-04-25 DIAGNOSIS — D692 Other nonthrombocytopenic purpura: Secondary | ICD-10-CM | POA: Diagnosis not present

## 2017-04-25 DIAGNOSIS — D225 Melanocytic nevi of trunk: Secondary | ICD-10-CM | POA: Diagnosis not present

## 2017-04-25 DIAGNOSIS — L570 Actinic keratosis: Secondary | ICD-10-CM | POA: Diagnosis not present

## 2017-04-25 DIAGNOSIS — L812 Freckles: Secondary | ICD-10-CM | POA: Diagnosis not present

## 2017-04-28 ENCOUNTER — Encounter: Payer: Self-pay | Admitting: Adult Health

## 2017-04-28 ENCOUNTER — Ambulatory Visit: Payer: Medicare HMO | Admitting: Adult Health

## 2017-04-28 DIAGNOSIS — G4733 Obstructive sleep apnea (adult) (pediatric): Secondary | ICD-10-CM | POA: Diagnosis not present

## 2017-04-28 DIAGNOSIS — M1711 Unilateral primary osteoarthritis, right knee: Secondary | ICD-10-CM | POA: Diagnosis not present

## 2017-04-28 NOTE — Progress Notes (Signed)
@Patient  ID: Wayne [name], male    DOB: [birth year], 67 y.o.   MRN: [id number]  Chief Complaint  Patient presents with  . Follow-up    OSA     Referring provider: Gaynelle Arabian, MD  HPI: 67 year old male seen for sleep consult November 2018 found to have severe sleep apnea  TEST  HST showed severe OSA with 43 events per hour   04/28/2017 Follow up : OSA  Patient returns for a follow-up for sleep apnea.  Patient was seen last visit for a sleep consult.  Patient was set up for home sleep study that showed severe sleep apnea with an AHI of 43/hour.  Patient was started on CPAP at bedtime.  Patient says he is trying to get used to it.  He wears it each night.  For about 4 to 5 hours.  Download shows excellent compliance with average usage at 4.5 hours.  Patient is on auto CPAP 5-27 was H2O.  AHI 47 , + central events , positive leaks (pt did have central events on HST) .  Pt says he does not feel any different. Still tired during daytime .      Allergies  Allergen Reactions  . Mirapex [Pramipexole Dihydrochloride] Other (See Comments)    Patient does not remember   . Trazodone And Nefazodone Other (See Comments)    DROWSY    Immunization History  Administered Date(s) Administered  . Influenza, High Dose Seasonal PF 10/14/2016    Past Medical History:  Diagnosis Date  . Basal cell carcinoma    MOHS  . Coronary artery disease 9/11   S/P MI and PTCA--X 4 STENTS  . Diverticulosis   . Hip pain    CHRONIC  . Hyperlipidemia   . LBBB (left bundle branch block)   . Neuropathy    IDIOPATHIC  . Sleep apnea 11/22/2016   does not wear CPAP    Tobacco History: Social History   Tobacco Use  Smoking Status Never Smoker  Smokeless Tobacco Never Used   Counseling given: Not Answered   Outpatient Encounter Medications as of 04/28/2017  Medication Sig  . aspirin EC 81 MG tablet Take 1 tablet (81 mg total) by mouth daily.  . clopidogrel (PLAVIX) 75 MG tablet Take 1 tablet  (75 mg total) by mouth daily with breakfast.  . DULoxetine (CYMBALTA) 60 MG capsule Take 60 mg by mouth daily.  [name] gabapentin (NEURONTIN) 800 MG tablet Take 800-1,600 mg 2 (two) times daily by mouth. Takes 1 tablet in the morning and 2 tablets at bedtime  . lisinopril (PRINIVIL,ZESTRIL) 5 MG tablet TAKE 1 TABLET (5 MG TOTAL) BY MOUTH DAILY.  . metoprolol succinate (TOPROL-XL) 25 MG 24 hr tablet TAKE 1/2 TABLET BY MOUTH 2 TIMES DAILY  . nitroGLYCERIN (NITROSTAT) 0.4 MG SL tablet Dissolve one tablet under tongue every 5 minutes as needed for chest pain; up to 3 doses--call 911 if pain persist  . Omega-3 Fatty Acids (FISH OIL) 1000 MG CAPS Take 2 capsules (2,000 mg total) by mouth daily. (Patient taking differently: Take 2,000 mg 2 (two) times daily by mouth. )  . oxyCODONE-acetaminophen (PERCOCET) 10-325 MG per tablet Take 1 tablet every 6 (six) hours as needed by mouth for pain.   . rosuvastatin (CRESTOR) 40 MG tablet TAKE 1 TABLET (40 MG TOTAL) BY MOUTH DAILY. (Patient taking differently: Take 40 mg every evening by mouth. )  . zolpidem (AMBIEN) 5 MG tablet Take 5-10 mg at bedtime by mouth. Depends on insomnia  if takes 5-10 mg  . [DISCONTINUED] nitroGLYCERIN (NITROSTAT) 0.4 MG SL tablet Place 1 tablet (0.4 mg total) under the tongue every 5 (five) minutes as needed for chest pain.  . [DISCONTINUED] oxyCODONE-acetaminophen (PERCOCET/ROXICET) 5-325 MG tablet Take 1 tablet every 6 (six) hours as needed by mouth for moderate pain. (Patient not taking: Reported on 04/28/2017)   No facility-administered encounter medications on file as of 04/28/2017.      Review of Systems  Constitutional:   No  weight loss, night sweats,  Fevers, chills, fatigue, or  lassitude.  HEENT:   No headaches,  Difficulty swallowing,  Tooth/dental problems, or  Sore throat,                No sneezing, itching, ear ache, nasal congestion, post nasal drip,   CV:  No chest pain,  Orthopnea, PND, swelling in lower extremities,  anasarca, dizziness, palpitations, syncope.   GI  No heartburn, indigestion, abdominal pain, nausea, vomiting, diarrhea, change in bowel habits, loss of appetite, bloody stools.   Resp: No shortness of breath with exertion or at rest.  No excess mucus, no productive cough,  No non-productive cough,  No coughing up of blood.  No change in color of mucus.  No wheezing.  No chest wall deformity  Skin: no rash or lesions.  GU: no dysuria, change in color of urine, no urgency or frequency.  No flank pain, no hematuria   MS:  No joint pain or swelling.  No decreased range of motion.  No back pain.    Physical Exam  BP 136/72 (BP Location: Left Arm, Cuff Size: Normal)   Pulse 77   Ht 5\' 11"  (1.803 m)   Wt 222 lb (100.7 kg)   SpO2 95%   BMI 30.96 kg/m   GEN: A/Ox3; pleasant , NAD, overweight .    HEENT:  [place]/AT,  EACs-clear, TMs-wnl, NOSE-clear, THROAT-clear, no lesions, no postnasal drip or exudate noted. Class 2-3 MP airway   NECK:  Supple w/ fair ROM; no JVD; normal carotid impulses w/o bruits; no thyromegaly or nodules palpated; no lymphadenopathy.    RESP  Clear  P & A; w/o, wheezes/ rales/ or rhonchi. no accessory muscle use, no dullness to percussion  CARD:  RRR, no m/r/g, no peripheral edema, pulses intact, no cyanosis or clubbing.  GI:   Soft & nt; nml bowel sounds; no organomegaly or masses detected.   Musco: Warm bil, no deformities or joint swelling noted.   Neuro: alert, no focal deficits noted.    Skin: Warm, no lesions or rashes    Lab Results:  CBC  No results found for: BNP  ProBNP No results found for: PROBNP  Imaging: No results found.   Assessment & Plan:   OSA (obstructive sleep apnea) Severe OSA ( w/ central and obstructive event on HST ) - no improvement on CPAP with severe OSA /w/ persistent central events  -set up for BIPAP titration study . - may need to move to ASV to control events.   Plan  Patient Instructions  Set up for BIPAP   titration study.. Change CPAP 10 to 20 cm  Add chin strap .  Follow up with Dr. Elsworth Soho  In 2-3 months and As needed           Rexene Edison, NP 04/28/2017

## 2017-04-28 NOTE — Assessment & Plan Note (Signed)
Severe OSA ( w/ central and obstructive event on HST ) - no improvement on CPAP with severe OSA /w/ persistent central events  -set up for BIPAP titration study . - may need to move to ASV to control events.   Plan  Patient Instructions  Set up for BIPAP  titration study.. Change CPAP 10 to 20 cm  Add chin strap .  Follow up with Dr. Elsworth Soho  In 2-3 months and As needed

## 2017-04-28 NOTE — Addendum Note (Signed)
Addended by: Parke Poisson E on: 04/28/2017 11:50 AM   Modules accepted: Orders

## 2017-04-28 NOTE — Patient Instructions (Addendum)
Set up for BIPAP  titration study.. Change CPAP 10 to 20 cm  Add chin strap .  Follow up with Dr. Elsworth Soho  In 2-3 months and As needed

## 2017-05-01 DIAGNOSIS — G4733 Obstructive sleep apnea (adult) (pediatric): Secondary | ICD-10-CM | POA: Diagnosis not present

## 2017-05-05 DIAGNOSIS — R69 Illness, unspecified: Secondary | ICD-10-CM | POA: Diagnosis not present

## 2017-05-13 ENCOUNTER — Telehealth: Payer: Self-pay | Admitting: Adult Health

## 2017-05-13 DIAGNOSIS — G4733 Obstructive sleep apnea (adult) (pediatric): Secondary | ICD-10-CM

## 2017-05-13 NOTE — Telephone Encounter (Signed)
  Return in about 2 months (around 06/28/2017) for Follow up with Dr. Elsworth Soho.  Set up for BIPAP  titration study.. Change CPAP 10 to 20 cm  Add chin strap .  Follow up with Dr. Elsworth Soho  In 2-3 months and As needed        TP notes verify this and I will place an order for Bipap titration. PCC's please note change.

## 2017-05-17 ENCOUNTER — Ambulatory Visit (HOSPITAL_BASED_OUTPATIENT_CLINIC_OR_DEPARTMENT_OTHER): Payer: Medicare HMO | Attending: Adult Health | Admitting: Pulmonary Disease

## 2017-05-17 ENCOUNTER — Encounter (HOSPITAL_BASED_OUTPATIENT_CLINIC_OR_DEPARTMENT_OTHER): Payer: Medicare HMO

## 2017-05-17 VITALS — Ht 71.0 in | Wt 220.0 lb

## 2017-05-17 DIAGNOSIS — G4731 Primary central sleep apnea: Secondary | ICD-10-CM | POA: Insufficient documentation

## 2017-05-17 DIAGNOSIS — G4739 Other sleep apnea: Secondary | ICD-10-CM

## 2017-05-17 DIAGNOSIS — G4733 Obstructive sleep apnea (adult) (pediatric): Secondary | ICD-10-CM

## 2017-05-19 DIAGNOSIS — G4733 Obstructive sleep apnea (adult) (pediatric): Secondary | ICD-10-CM | POA: Diagnosis not present

## 2017-05-19 NOTE — Procedures (Signed)
    Patient Name: Wayne [name], Wayne [name] Date: 05/17/2017   Gender: Male  D.O.B: Jul 23, 1950  Age (years): 66  Referring Provider: Lynelle Smoke Parrett  Height (inches): 71  Interpreting Physician: Chesley Mires MD, ABSM  Weight (lbs): 220  RPSGT: [place], [place]  BMI: 31  MRN: [id number]  Neck Size: 17.00   CLINICAL INFORMATION  The patient is referred for a adaptive servo-ventilator titration study.  MEDICATIONS  Medications self-administered by patient taken the night of the study : ZOLPIDEM TARTRATE SLEEP STUDY TECHNIQUE  As per the AASM Manual for the Scoring of Sleep and Associated Events v2.3 (April 2016) with a hypopnea requiring 4% desaturations. The channels recorded and monitored were frontal, central and occipital EEG, electrooculogram (EOG), submentalis EMG (chin), nasal and oral airflow, thoracic and abdominal wall motion, anterior tibialis EMG, snore microphone, electrocardiogram, and pulse oximetry. RESPIRATORY PARAMETERS  Optimal Min IPAP (cm): 6 Optimal Max IPAP (cm): 7 Optimal Min EPAP (cm): 6 Optimal Max EPAP (cm): 7  Optimal Max Pressure (cm): 15 Optimal Min PS (cm): 6 Optimal Max PS (cm): 15 Opitmal Breathing Rate (/min): Auto  Overall Min O2 (%): 82.0 Min O2 at Optimal Pressure (%): 82.0 AHI at Optimal (/hr): N/A      SLEEP ARCHITECTURE  During a recording time of 379.7 minutes, the patient slept for 296.8 minutes. Sleep efficiency was 78.2%%. The patient spent 13.3%% of the night in stage N1 sleep, 76.7%% in stage N2 sleep, 3.4%% in stage N3 and 6.57% in REM. Wake after sleep onset (WASO) was 46.0 minutes. Alpha intrusion was PRESENT ABSENT. Supine sleep was 53.80%. The arousal index was 13.7. LEG MOVEMENT DATA  PLM Index (/hr): 16.8 PLM Arousal Index (/hr): 0.8  CARDIAC DATA  The 2 lead EKG demonstrated sinus rhythm. The mean heart rate was 57.0 beats per minute. Other EKG findings include: None.  IMPRESSIONS  - He was tried on Bipap, but had persistent central  apnea. - He was transitioned to Adaptive servo-ventilator (ASV) and did much better. Optimal setting was minimal EPAP 6 cm H2O, maximal pressure support 15 cm H2O, minimal pressure support 6 cm H2O. - He did not need supplemental oxygen during this study. DIAGNOSIS  - Obstructive Sleep Apnea. - Treatment Emergent Central Sleep Apnea. RECOMMENDATIONS  - Trial of SV Advance EPAP Min 6, maximal pressure support 15 cm H2O, minimal pressure support 6 cm H2O. [Electronically signed] 05/19/2017 05:00 PM Chesley Mires MD, [place], American Board of Sleep Medicine  NPI: [id number]

## 2017-05-29 DIAGNOSIS — M1711 Unilateral primary osteoarthritis, right knee: Secondary | ICD-10-CM | POA: Diagnosis not present

## 2017-05-31 DIAGNOSIS — G4733 Obstructive sleep apnea (adult) (pediatric): Secondary | ICD-10-CM | POA: Diagnosis not present

## 2017-06-02 ENCOUNTER — Telehealth: Payer: Self-pay | Admitting: Pulmonary Disease

## 2017-06-02 DIAGNOSIS — G609 Hereditary and idiopathic neuropathy, unspecified: Secondary | ICD-10-CM | POA: Diagnosis not present

## 2017-06-02 DIAGNOSIS — G4733 Obstructive sleep apnea (adult) (pediatric): Secondary | ICD-10-CM

## 2017-06-02 DIAGNOSIS — M1612 Unilateral primary osteoarthritis, left hip: Secondary | ICD-10-CM | POA: Diagnosis not present

## 2017-06-02 DIAGNOSIS — G4700 Insomnia, unspecified: Secondary | ICD-10-CM | POA: Diagnosis not present

## 2017-06-02 DIAGNOSIS — G894 Chronic pain syndrome: Secondary | ICD-10-CM | POA: Diagnosis not present

## 2017-06-02 NOTE — Telephone Encounter (Signed)
Pt is requesting sleep study results.  RA please advise. Thanks

## 2017-06-03 NOTE — Telephone Encounter (Signed)
Sorry for the delay. For some reason, sleep study was read by different MD and I was never informed He had persistent central apneas and required a different type of device   Please send prescription for - discontinue CPAP -ASV Advance EPAP Min 6, maximal pressure support 15 cm H2O, minimal pressure support 6 cm H2O.

## 2017-06-03 NOTE — Telephone Encounter (Signed)
Called pt and advised message from the provider. Pt understood and verbalized understanding. Nothing further is needed.   EPAP order placed.

## 2017-06-23 DIAGNOSIS — M1612 Unilateral primary osteoarthritis, left hip: Secondary | ICD-10-CM | POA: Diagnosis not present

## 2017-06-23 DIAGNOSIS — G4731 Primary central sleep apnea: Secondary | ICD-10-CM | POA: Diagnosis not present

## 2017-06-23 DIAGNOSIS — G609 Hereditary and idiopathic neuropathy, unspecified: Secondary | ICD-10-CM | POA: Diagnosis not present

## 2017-06-23 DIAGNOSIS — M1711 Unilateral primary osteoarthritis, right knee: Secondary | ICD-10-CM | POA: Diagnosis not present

## 2017-06-23 DIAGNOSIS — G894 Chronic pain syndrome: Secondary | ICD-10-CM | POA: Diagnosis not present

## 2017-06-23 DIAGNOSIS — G4700 Insomnia, unspecified: Secondary | ICD-10-CM | POA: Diagnosis not present

## 2017-06-30 ENCOUNTER — Ambulatory Visit: Payer: Medicare HMO | Admitting: Pulmonary Disease

## 2017-06-30 DIAGNOSIS — I447 Left bundle-branch block, unspecified: Secondary | ICD-10-CM | POA: Diagnosis not present

## 2017-06-30 DIAGNOSIS — I252 Old myocardial infarction: Secondary | ICD-10-CM | POA: Diagnosis not present

## 2017-06-30 DIAGNOSIS — I10 Essential (primary) hypertension: Secondary | ICD-10-CM | POA: Diagnosis not present

## 2017-06-30 DIAGNOSIS — G4733 Obstructive sleep apnea (adult) (pediatric): Secondary | ICD-10-CM | POA: Diagnosis not present

## 2017-06-30 DIAGNOSIS — M1711 Unilateral primary osteoarthritis, right knee: Secondary | ICD-10-CM | POA: Diagnosis not present

## 2017-06-30 DIAGNOSIS — I2510 Atherosclerotic heart disease of native coronary artery without angina pectoris: Secondary | ICD-10-CM | POA: Diagnosis not present

## 2017-07-02 DIAGNOSIS — Z1159 Encounter for screening for other viral diseases: Secondary | ICD-10-CM | POA: Diagnosis not present

## 2017-07-02 DIAGNOSIS — E782 Mixed hyperlipidemia: Secondary | ICD-10-CM | POA: Diagnosis not present

## 2017-07-02 DIAGNOSIS — Z23 Encounter for immunization: Secondary | ICD-10-CM | POA: Diagnosis not present

## 2017-07-02 DIAGNOSIS — Z79899 Other long term (current) drug therapy: Secondary | ICD-10-CM | POA: Diagnosis not present

## 2017-07-02 DIAGNOSIS — I2510 Atherosclerotic heart disease of native coronary artery without angina pectoris: Secondary | ICD-10-CM | POA: Diagnosis not present

## 2017-07-02 DIAGNOSIS — Z0000 Encounter for general adult medical examination without abnormal findings: Secondary | ICD-10-CM | POA: Diagnosis not present

## 2017-07-02 DIAGNOSIS — G629 Polyneuropathy, unspecified: Secondary | ICD-10-CM | POA: Diagnosis not present

## 2017-07-02 DIAGNOSIS — Z125 Encounter for screening for malignant neoplasm of prostate: Secondary | ICD-10-CM | POA: Diagnosis not present

## 2017-07-02 DIAGNOSIS — M25559 Pain in unspecified hip: Secondary | ICD-10-CM | POA: Diagnosis not present

## 2017-07-10 ENCOUNTER — Ambulatory Visit: Payer: Medicare HMO | Admitting: Pulmonary Disease

## 2017-07-11 ENCOUNTER — Encounter: Payer: Self-pay | Admitting: Physician Assistant

## 2017-07-11 ENCOUNTER — Ambulatory Visit: Payer: Medicare HMO | Admitting: Physician Assistant

## 2017-07-11 VITALS — BP 130/60 | HR 54 | Ht 71.0 in | Wt 218.0 lb

## 2017-07-11 DIAGNOSIS — Z01810 Encounter for preprocedural cardiovascular examination: Secondary | ICD-10-CM

## 2017-07-11 DIAGNOSIS — E782 Mixed hyperlipidemia: Secondary | ICD-10-CM

## 2017-07-11 DIAGNOSIS — I2510 Atherosclerotic heart disease of native coronary artery without angina pectoris: Secondary | ICD-10-CM | POA: Diagnosis not present

## 2017-07-11 DIAGNOSIS — I10 Essential (primary) hypertension: Secondary | ICD-10-CM

## 2017-07-11 NOTE — Progress Notes (Signed)
Cardiology Office Note:    Date:  07/11/2017   ID:  Wayne [name], DOB Feb 13, 1950, MRN [id number]  PCP:  Wayne Arabian, MD  Cardiologist:  Wayne Grooms, MD   Referring MD: Wayne Arabian, MD   Chief Complaint  Patient presents with  . Follow-up    CAD  . Surgical Clearance    History of Present Illness:    Wayne [name] is a 67 y.o. male with coronary artery disease status post multivessel PCI in 2011, hyperlipidemia, left bundle branch block, sleep apnea.  Last seen by Dr. Irish [name] July 2018.  Mr. Wayne [name] returns for follow-up on coronary artery disease as well as surgical clearance.  He needs hip surgery at [facility] in August 2019 (Dr. Landis Gandy).  He will also need knee surgery in November 2011.  Since last seen, he denies chest pain, shortness of breath, syncope, PND or edema.  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  Prior CV studies:   The following studies were reviewed today:  Nuclear stress test 02/25/2012 EF 66, normal perfusion, low risk  PCI 10/07/2009 DES to the LAD DES to the LCx  Cardiac catheterization 09/27/09 LCx proximal to mid 27 LAD mid 50 RCA mid 80, distal 95 PCI DES to the RCA  Past Medical History:  Diagnosis Date  . Basal cell carcinoma    MOHS  . Coronary artery disease 9/11   S/P MI and PTCA--X 4 STENTS  . Diverticulosis   . Hip pain    CHRONIC  . Hyperlipidemia   . LBBB (left bundle branch block)   . Neuropathy    IDIOPATHIC  . Sleep apnea 11/22/2016   does not wear CPAP   Surgical Hx: The patient  has a past surgical history that includes Cholecystectomy; Slipped capital femoral epiphysis pinning; RIGHT SHOULDER BONE TUMOR; Cardiac catheterization (09/11); Joint replacement (11/22/2016); and Lumbar laminectomy/decompression microdiscectomy (Left, 11/26/2016).   Current Medications: Current Meds  Medication Sig  . aspirin EC 81 MG tablet Take 1 tablet (81 mg total) by mouth daily.  . clopidogrel (PLAVIX) 75 MG tablet Take 1  tablet (75 mg total) by mouth daily with breakfast.  . DULoxetine (CYMBALTA) 60 MG capsule Take 60 mg by mouth daily.  [name] gabapentin (NEURONTIN) 800 MG tablet Take 800-1,600 mg 2 (two) times daily by mouth. Takes 1 tablet in the morning and 2 tablets at bedtime  . lisinopril (PRINIVIL,ZESTRIL) 5 MG tablet TAKE 1 TABLET (5 MG TOTAL) BY MOUTH DAILY.  . metoprolol succinate (TOPROL-XL) 25 MG 24 hr tablet TAKE 1/2 TABLET BY MOUTH 2 TIMES DAILY  . nitroGLYCERIN (NITROSTAT) 0.4 MG SL tablet Dissolve one tablet under tongue every 5 minutes as needed for chest pain; up to 3 doses--call 911 if pain persist  . Omega-3 Fatty Acids (FISH OIL) 1000 MG CAPS Take 2 capsules (2,000 mg total) by mouth daily. (Patient taking differently: Take 2,000 mg 2 (two) times daily by mouth. )  . oxyCODONE-acetaminophen (PERCOCET) 10-325 MG per tablet Take 1 tablet every 6 (six) hours as needed by mouth for pain.   . rosuvastatin (CRESTOR) 40 MG tablet TAKE 1 TABLET (40 MG TOTAL) BY MOUTH DAILY. (Patient taking differently: Take 40 mg every evening by mouth. )  . zolpidem (AMBIEN) 5 MG tablet Take 5-10 mg at bedtime by mouth. Depends on insomnia if takes 5-10 mg     Allergies:   Mirapex [pramipexole dihydrochloride] and Trazodone and nefazodone   Social History   Tobacco Use  . Smoking  status: Never Smoker  . Smokeless tobacco: Never Used  Substance Use Topics  . Alcohol use: No    Frequency: Never  . Drug use: No     Family Hx: The patient's family history includes Cancer in his mother; Lung cancer in his father.  ROS:   Please see the history of present illness.    Review of Systems  Musculoskeletal: Positive for back pain, joint swelling and myalgias.   All other systems reviewed and are negative.   EKGs/Labs/Other Test Reviewed:    EKG:  EKG is  ordered today.  The ekg ordered today demonstrates sinus bradycardia, HR 53, left bundle branch block, similar to prior tracing  Recent Labs: 11/22/2016: BUN  14; Creatinine, Ser 0.73; Hemoglobin 13.3; Platelets 242; Potassium 4.2; Sodium 136   From KPN Tool: Cholesterol, total 149.000 07/02/2017 HDL 44.000 07/02/2017 LDL 69.000 07/02/2017 Triglycerides 182.000 07/02/2017 Hemoglobin 13.300 11/22/2016 Creatinine, Serum 0.820 07/02/2017 Potassium 4.200 07/02/2017 ALT (SGPT) 13.000 07/02/2017 INR 0.940 11/22/2016 Platelets 242.000 11/22/2016  Recent Lipid Panel Lab Results  Component Value Date/Time   CHOL 128 07/25/2014 08:06 AM   TRIG 94.0 07/25/2014 08:06 AM   HDL 38.70 (L) 07/25/2014 08:06 AM   CHOLHDL 3 07/25/2014 08:06 AM   LDLCALC 71 07/25/2014 08:06 AM   LDLDIRECT 76.8 02/22/2013 07:48 AM    Physical Exam:    VS:  BP 130/60   Pulse (!) 54   Ht 5\' 11"  (1.803 m)   Wt 218 lb (98.9 kg)   SpO2 94%   BMI 30.40 kg/m     Wt Readings from Last 3 Encounters:  07/11/17 218 lb (98.9 kg)  05/17/17 220 lb (99.8 kg)  04/28/17 222 lb (100.7 kg)     Physical Exam  Constitutional: He is oriented to person, place, and time. He appears well-developed and well-nourished. No distress.  HENT:  Head: Normocephalic and atraumatic.  Neck: No JVD present. Carotid bruit is not present.  Cardiovascular: Normal rate, regular rhythm and normal heart sounds.  No murmur heard. Pulmonary/Chest: Effort normal and breath sounds normal. He has no rales.  Abdominal: Soft. There is no hepatomegaly.  Musculoskeletal: He exhibits no edema.  Neurological: He is alert and oriented to person, place, and time.  Skin: Skin is warm and dry.    ASSESSMENT & PLAN:    Coronary artery disease involving native coronary artery of native heart without angina pectoris History of multivessel PCI in 2011.  He has been maintained on long-term dual antiplatelet therapy.  Nuclear stress test in 2014 was low risk.  He denies any symptoms consistent with angina.  Continue current medical regimen which includes aspirin, Clopidogrel, lisinopril, metoprolol succinate,  rosuvastatin.  Essential hypertension, benign The patient's blood pressure is controlled on his current regimen.  Continue current therapy.   Mixed hyperlipidemia LDL optimal on most recent lab work.  Continue current Rx.    Preoperative cardiovascular examination As noted, he will need hip surgery 2019 at [facility].  His perioperative risk of major cardiac event is low at 0.9% according to the Revised Cardiac Risk Index.  He is not having any symptoms to suggest angina.  Therefore, he can proceed with his procedure at acceptable risk without further testing.  He may hold Plavix for 5 days prior to his procedure and resume afterwards as soon as possible (when felt to be safe).  Of note, we would prefer that he remain on aspirin without interruption to prevent late stent thrombosis.  However, if the bleeding risk  is too great, he may hold aspirin for 5 to 7 days and resume it after his procedure as soon as it is felt to be safe.   Dispo:  Return in about 1 year (around 07/12/2018) for Routine Follow Up w/ Dr. Irish [name].   Medication Adjustments/Labs and Tests Ordered: Current medicines are reviewed at length with the patient today.  Concerns regarding medicines are outlined above.  Tests Ordered: Orders Placed This Encounter  Procedures  . EKG 12-Lead   Medication Changes: No orders of the defined types were placed in this encounter.   Signed, Richardson Dopp, PA-C  07/11/2017 2:04 PM    [facility] [address], [place], [place]  [address] Phone: [phone]; Fax: [phone]

## 2017-07-11 NOTE — Patient Instructions (Addendum)
Medication Instructions:  No changes   Labwork: None   Testing/Procedures: None   Follow-Up: Larae Grooms, MD in 1 year.   Any Other Special Instructions Will Be Listed Below (If Applicable).  If you need a refill on your cardiac medications before your next appointment, please call your pharmacy.

## 2017-07-23 DIAGNOSIS — G4731 Primary central sleep apnea: Secondary | ICD-10-CM | POA: Diagnosis not present

## 2017-07-24 ENCOUNTER — Encounter: Payer: Self-pay | Admitting: Physician Assistant

## 2017-07-24 NOTE — Telephone Encounter (Signed)
Please follow up with patient.  I did not call him. Richardson Dopp, PA-C    07/24/2017 12:50 PM

## 2017-07-25 DIAGNOSIS — G609 Hereditary and idiopathic neuropathy, unspecified: Secondary | ICD-10-CM | POA: Diagnosis not present

## 2017-07-25 DIAGNOSIS — G894 Chronic pain syndrome: Secondary | ICD-10-CM | POA: Diagnosis not present

## 2017-07-25 DIAGNOSIS — M1612 Unilateral primary osteoarthritis, left hip: Secondary | ICD-10-CM | POA: Diagnosis not present

## 2017-07-25 DIAGNOSIS — Z79891 Long term (current) use of opiate analgesic: Secondary | ICD-10-CM | POA: Diagnosis not present

## 2017-07-25 DIAGNOSIS — G4700 Insomnia, unspecified: Secondary | ICD-10-CM | POA: Diagnosis not present

## 2017-08-04 DIAGNOSIS — M1711 Unilateral primary osteoarthritis, right knee: Secondary | ICD-10-CM | POA: Diagnosis not present

## 2017-08-04 DIAGNOSIS — I2510 Atherosclerotic heart disease of native coronary artery without angina pectoris: Secondary | ICD-10-CM | POA: Diagnosis not present

## 2017-08-04 DIAGNOSIS — I10 Essential (primary) hypertension: Secondary | ICD-10-CM | POA: Diagnosis not present

## 2017-08-04 DIAGNOSIS — G4733 Obstructive sleep apnea (adult) (pediatric): Secondary | ICD-10-CM | POA: Diagnosis not present

## 2017-08-04 DIAGNOSIS — M1612 Unilateral primary osteoarthritis, left hip: Secondary | ICD-10-CM | POA: Diagnosis not present

## 2017-08-04 DIAGNOSIS — Z01818 Encounter for other preprocedural examination: Secondary | ICD-10-CM | POA: Diagnosis not present

## 2017-08-04 DIAGNOSIS — Z9889 Other specified postprocedural states: Secondary | ICD-10-CM | POA: Diagnosis not present

## 2017-08-18 ENCOUNTER — Encounter: Payer: Self-pay | Admitting: Pulmonary Disease

## 2017-08-18 ENCOUNTER — Ambulatory Visit: Payer: Medicare HMO | Admitting: Pulmonary Disease

## 2017-08-18 DIAGNOSIS — G4733 Obstructive sleep apnea (adult) (pediatric): Secondary | ICD-10-CM

## 2017-08-18 NOTE — Assessment & Plan Note (Signed)
ASV machine is working better. -a slight mouth leak persists Trial of air fit F 30 fullface mask if nasal pillows do not work  Weight loss encouraged, compliance with goal of at least 4-6 hrs every night is the expectation. Advised against medications with sedative side effects Cautioned against driving when sleepy - understanding that sleepiness will vary on a day to day basis

## 2017-08-18 NOTE — Progress Notes (Signed)
   Subjective:    Patient ID: CALDWELL KRONENBERGER, male    DOB: 1950-12-04, 67 y.o.   MRN: [id number]  HPI 67 year old with CAD for follow-up of obstructive sleep apnea He had persistent central apneas on CPAP and hence bilevel titration was ordered. On this titration study he had persistent centrals and ASV was recommended.  He was started on ASV machine about a month ago His main complaint was snoring and this is been controlled, however he has not seen much improvement in his daytime fatigue.  He never had excessive somnolence surprisingly. He also has other issues and is due for a hip replacement and perhaps a knee replacement in the future.  ASV download was reviewed which shows better control of events with residual AHI of 5.6 an hour and minimal leak, usage is much improved with about 5 hours on average  Significant tests/ events reviewed  HST showed severe OSA with 43 events per hour 05/2017 central apneas persisted on BiPAP titration >> recommend ASV  Review of Systems Patient denies significant dyspnea,cough, hemoptysis,  chest pain, palpitations, pedal edema, orthopnea, paroxysmal nocturnal dyspnea, lightheadedness, nausea, vomiting, abdominal or  leg pains      Objective:   Physical Exam  Gen. Pleasant, well-nourished, in no distress ENT - no thrush, no post nasal drip Neck: No JVD, no thyromegaly, no carotid bruits Lungs: no use of accessory muscles, no dullness to percussion, clear without rales or rhonchi  Cardiovascular: Rhythm regular, heart sounds  normal, no murmurs or gallops, no peripheral edema Musculoskeletal: No deformities, no cyanosis or clubbing        Assessment & Plan:

## 2017-08-18 NOTE — Patient Instructions (Signed)
ASV machine is working better. You do have a slight mouth leak. Trial of air fit F 30 fullface mask if nasal pillows do not work

## 2017-08-23 DIAGNOSIS — G4731 Primary central sleep apnea: Secondary | ICD-10-CM | POA: Diagnosis not present

## 2017-08-25 DIAGNOSIS — G894 Chronic pain syndrome: Secondary | ICD-10-CM | POA: Diagnosis not present

## 2017-08-25 DIAGNOSIS — M1612 Unilateral primary osteoarthritis, left hip: Secondary | ICD-10-CM | POA: Diagnosis not present

## 2017-08-25 DIAGNOSIS — G609 Hereditary and idiopathic neuropathy, unspecified: Secondary | ICD-10-CM | POA: Diagnosis not present

## 2017-08-25 DIAGNOSIS — G4700 Insomnia, unspecified: Secondary | ICD-10-CM | POA: Diagnosis not present

## 2017-08-26 DIAGNOSIS — I10 Essential (primary) hypertension: Secondary | ICD-10-CM | POA: Diagnosis not present

## 2017-08-26 DIAGNOSIS — G8929 Other chronic pain: Secondary | ICD-10-CM | POA: Diagnosis not present

## 2017-08-26 DIAGNOSIS — Z471 Aftercare following joint replacement surgery: Secondary | ICD-10-CM | POA: Diagnosis not present

## 2017-08-26 DIAGNOSIS — R69 Illness, unspecified: Secondary | ICD-10-CM | POA: Diagnosis not present

## 2017-08-26 DIAGNOSIS — Z9889 Other specified postprocedural states: Secondary | ICD-10-CM | POA: Diagnosis not present

## 2017-08-26 DIAGNOSIS — Z955 Presence of coronary angioplasty implant and graft: Secondary | ICD-10-CM | POA: Insufficient documentation

## 2017-08-26 DIAGNOSIS — I2510 Atherosclerotic heart disease of native coronary artery without angina pectoris: Secondary | ICD-10-CM | POA: Diagnosis not present

## 2017-08-26 DIAGNOSIS — M1612 Unilateral primary osteoarthritis, left hip: Secondary | ICD-10-CM | POA: Diagnosis not present

## 2017-08-26 DIAGNOSIS — G4733 Obstructive sleep apnea (adult) (pediatric): Secondary | ICD-10-CM | POA: Diagnosis not present

## 2017-08-26 DIAGNOSIS — I252 Old myocardial infarction: Secondary | ICD-10-CM | POA: Diagnosis not present

## 2017-08-26 DIAGNOSIS — G609 Hereditary and idiopathic neuropathy, unspecified: Secondary | ICD-10-CM | POA: Diagnosis not present

## 2017-08-26 DIAGNOSIS — Z96642 Presence of left artificial hip joint: Secondary | ICD-10-CM | POA: Diagnosis not present

## 2017-08-26 DIAGNOSIS — Z6831 Body mass index (BMI) 31.0-31.9, adult: Secondary | ICD-10-CM | POA: Diagnosis not present

## 2017-08-26 DIAGNOSIS — E669 Obesity, unspecified: Secondary | ICD-10-CM | POA: Diagnosis not present

## 2017-08-26 DIAGNOSIS — G8918 Other acute postprocedural pain: Secondary | ICD-10-CM | POA: Diagnosis not present

## 2017-08-28 DIAGNOSIS — M1612 Unilateral primary osteoarthritis, left hip: Secondary | ICD-10-CM | POA: Diagnosis not present

## 2017-09-09 DIAGNOSIS — M25552 Pain in left hip: Secondary | ICD-10-CM | POA: Diagnosis not present

## 2017-09-09 DIAGNOSIS — Z96642 Presence of left artificial hip joint: Secondary | ICD-10-CM | POA: Diagnosis not present

## 2017-09-12 ENCOUNTER — Other Ambulatory Visit: Payer: Self-pay | Admitting: Interventional Cardiology

## 2017-09-23 DIAGNOSIS — G4731 Primary central sleep apnea: Secondary | ICD-10-CM | POA: Diagnosis not present

## 2017-09-29 DIAGNOSIS — G894 Chronic pain syndrome: Secondary | ICD-10-CM | POA: Diagnosis not present

## 2017-09-29 DIAGNOSIS — G609 Hereditary and idiopathic neuropathy, unspecified: Secondary | ICD-10-CM | POA: Diagnosis not present

## 2017-09-29 DIAGNOSIS — R69 Illness, unspecified: Secondary | ICD-10-CM | POA: Diagnosis not present

## 2017-09-29 DIAGNOSIS — G4700 Insomnia, unspecified: Secondary | ICD-10-CM | POA: Diagnosis not present

## 2017-10-06 DIAGNOSIS — H5213 Myopia, bilateral: Secondary | ICD-10-CM | POA: Diagnosis not present

## 2017-10-06 DIAGNOSIS — H524 Presbyopia: Secondary | ICD-10-CM | POA: Diagnosis not present

## 2017-10-06 DIAGNOSIS — H52223 Regular astigmatism, bilateral: Secondary | ICD-10-CM | POA: Diagnosis not present

## 2017-10-14 DIAGNOSIS — Z01818 Encounter for other preprocedural examination: Secondary | ICD-10-CM | POA: Diagnosis not present

## 2017-10-14 DIAGNOSIS — H2511 Age-related nuclear cataract, right eye: Secondary | ICD-10-CM | POA: Diagnosis not present

## 2017-10-14 DIAGNOSIS — H33313 Horseshoe tear of retina without detachment, bilateral: Secondary | ICD-10-CM | POA: Diagnosis not present

## 2017-10-14 DIAGNOSIS — H2512 Age-related nuclear cataract, left eye: Secondary | ICD-10-CM | POA: Diagnosis not present

## 2017-10-14 DIAGNOSIS — H02831 Dermatochalasis of right upper eyelid: Secondary | ICD-10-CM | POA: Diagnosis not present

## 2017-10-19 ENCOUNTER — Other Ambulatory Visit: Payer: Self-pay | Admitting: Interventional Cardiology

## 2017-10-23 DIAGNOSIS — G4731 Primary central sleep apnea: Secondary | ICD-10-CM | POA: Diagnosis not present

## 2017-10-23 DIAGNOSIS — H2512 Age-related nuclear cataract, left eye: Secondary | ICD-10-CM | POA: Diagnosis not present

## 2017-10-23 DIAGNOSIS — H25812 Combined forms of age-related cataract, left eye: Secondary | ICD-10-CM | POA: Diagnosis not present

## 2017-11-10 DIAGNOSIS — G894 Chronic pain syndrome: Secondary | ICD-10-CM | POA: Diagnosis not present

## 2017-11-10 DIAGNOSIS — R69 Illness, unspecified: Secondary | ICD-10-CM | POA: Diagnosis not present

## 2017-11-10 DIAGNOSIS — G609 Hereditary and idiopathic neuropathy, unspecified: Secondary | ICD-10-CM | POA: Diagnosis not present

## 2017-11-10 DIAGNOSIS — G4700 Insomnia, unspecified: Secondary | ICD-10-CM | POA: Diagnosis not present

## 2017-11-12 DIAGNOSIS — I447 Left bundle-branch block, unspecified: Secondary | ICD-10-CM | POA: Diagnosis not present

## 2017-11-12 DIAGNOSIS — Z955 Presence of coronary angioplasty implant and graft: Secondary | ICD-10-CM | POA: Diagnosis not present

## 2017-11-12 DIAGNOSIS — G4733 Obstructive sleep apnea (adult) (pediatric): Secondary | ICD-10-CM | POA: Diagnosis not present

## 2017-11-12 DIAGNOSIS — I2510 Atherosclerotic heart disease of native coronary artery without angina pectoris: Secondary | ICD-10-CM | POA: Diagnosis not present

## 2017-11-12 DIAGNOSIS — I10 Essential (primary) hypertension: Secondary | ICD-10-CM | POA: Diagnosis not present

## 2017-11-23 DIAGNOSIS — G4731 Primary central sleep apnea: Secondary | ICD-10-CM | POA: Diagnosis not present

## 2017-11-24 DIAGNOSIS — M1711 Unilateral primary osteoarthritis, right knee: Secondary | ICD-10-CM | POA: Diagnosis not present

## 2017-11-24 DIAGNOSIS — E669 Obesity, unspecified: Secondary | ICD-10-CM | POA: Diagnosis not present

## 2017-11-24 DIAGNOSIS — I2510 Atherosclerotic heart disease of native coronary artery without angina pectoris: Secondary | ICD-10-CM | POA: Diagnosis not present

## 2017-11-24 DIAGNOSIS — G8918 Other acute postprocedural pain: Secondary | ICD-10-CM | POA: Diagnosis not present

## 2017-11-24 DIAGNOSIS — M21162 Varus deformity, not elsewhere classified, left knee: Secondary | ICD-10-CM | POA: Diagnosis not present

## 2017-11-24 DIAGNOSIS — R69 Illness, unspecified: Secondary | ICD-10-CM | POA: Diagnosis not present

## 2017-11-24 DIAGNOSIS — M21262 Flexion deformity, left knee: Secondary | ICD-10-CM | POA: Diagnosis not present

## 2017-11-24 DIAGNOSIS — D649 Anemia, unspecified: Secondary | ICD-10-CM | POA: Diagnosis not present

## 2017-11-24 DIAGNOSIS — G4733 Obstructive sleep apnea (adult) (pediatric): Secondary | ICD-10-CM | POA: Diagnosis not present

## 2017-11-24 DIAGNOSIS — I10 Essential (primary) hypertension: Secondary | ICD-10-CM | POA: Diagnosis not present

## 2017-11-24 DIAGNOSIS — E782 Mixed hyperlipidemia: Secondary | ICD-10-CM | POA: Diagnosis not present

## 2017-11-24 DIAGNOSIS — Z23 Encounter for immunization: Secondary | ICD-10-CM | POA: Diagnosis not present

## 2017-11-24 DIAGNOSIS — E559 Vitamin D deficiency, unspecified: Secondary | ICD-10-CM | POA: Diagnosis not present

## 2017-11-30 ENCOUNTER — Other Ambulatory Visit: Payer: Self-pay

## 2017-11-30 ENCOUNTER — Emergency Department (HOSPITAL_COMMUNITY)
Admission: EM | Admit: 2017-11-30 | Discharge: 2017-11-30 | Disposition: A | Discharge status: Home / Self Care | Payer: Medicare HMO | Attending: Emergency Medicine | Admitting: Emergency Medicine

## 2017-11-30 ENCOUNTER — Encounter (HOSPITAL_COMMUNITY): Payer: Self-pay | Admitting: Emergency Medicine

## 2017-11-30 ENCOUNTER — Emergency Department (HOSPITAL_COMMUNITY): Payer: Medicare HMO

## 2017-11-30 DIAGNOSIS — Z7902 Long term (current) use of antithrombotics/antiplatelets: Secondary | ICD-10-CM | POA: Insufficient documentation

## 2017-11-30 DIAGNOSIS — I2510 Atherosclerotic heart disease of native coronary artery without angina pectoris: Secondary | ICD-10-CM | POA: Insufficient documentation

## 2017-11-30 DIAGNOSIS — Z9889 Other specified postprocedural states: Secondary | ICD-10-CM | POA: Diagnosis not present

## 2017-11-30 DIAGNOSIS — Z96651 Presence of right artificial knee joint: Secondary | ICD-10-CM | POA: Diagnosis not present

## 2017-11-30 DIAGNOSIS — Z7982 Long term (current) use of aspirin: Secondary | ICD-10-CM | POA: Diagnosis not present

## 2017-11-30 DIAGNOSIS — Z471 Aftercare following joint replacement surgery: Secondary | ICD-10-CM | POA: Diagnosis not present

## 2017-11-30 DIAGNOSIS — G8918 Other acute postprocedural pain: Secondary | ICD-10-CM | POA: Diagnosis not present

## 2017-11-30 DIAGNOSIS — R6883 Chills (without fever): Secondary | ICD-10-CM | POA: Diagnosis not present

## 2017-11-30 DIAGNOSIS — M25561 Pain in right knee: Secondary | ICD-10-CM | POA: Diagnosis not present

## 2017-11-30 DIAGNOSIS — M7989 Other specified soft tissue disorders: Secondary | ICD-10-CM | POA: Diagnosis not present

## 2017-11-30 DIAGNOSIS — R600 Localized edema: Secondary | ICD-10-CM | POA: Diagnosis not present

## 2017-11-30 DIAGNOSIS — M25461 Effusion, right knee: Secondary | ICD-10-CM | POA: Diagnosis not present

## 2017-11-30 DIAGNOSIS — M25661 Stiffness of right knee, not elsewhere classified: Secondary | ICD-10-CM | POA: Diagnosis not present

## 2017-11-30 DIAGNOSIS — Z79899 Other long term (current) drug therapy: Secondary | ICD-10-CM | POA: Insufficient documentation

## 2017-11-30 DIAGNOSIS — Z5181 Encounter for therapeutic drug level monitoring: Secondary | ICD-10-CM | POA: Diagnosis not present

## 2017-11-30 LAB — CBC WITH DIFFERENTIAL/PLATELET
Abs Immature Granulocytes: 0.03 10*3/uL (ref 0.00–0.07)
BASOS ABS: 0 10*3/uL (ref 0.0–0.1)
Basophils Relative: 0 %
EOS ABS: 0.2 10*3/uL (ref 0.0–0.5)
Eosinophils Relative: 2 %
HEMATOCRIT: 35.7 % — AB (ref 39.0–52.0)
Hemoglobin: 11.3 g/dL — ABNORMAL LOW (ref 13.0–17.0)
IMMATURE GRANULOCYTES: 0 %
LYMPHS ABS: 1.7 10*3/uL (ref 0.7–4.0)
Lymphocytes Relative: 17 %
MCH: 28.3 pg (ref 26.0–34.0)
MCHC: 31.7 g/dL (ref 30.0–36.0)
MCV: 89.5 fL (ref 80.0–100.0)
Monocytes Absolute: 0.8 10*3/uL (ref 0.1–1.0)
Monocytes Relative: 8 %
NRBC: 0 % (ref 0.0–0.2)
Neutro Abs: 7.5 10*3/uL (ref 1.7–7.7)
Neutrophils Relative %: 73 %
Platelets: 291 10*3/uL (ref 150–400)
RBC: 3.99 MIL/uL — ABNORMAL LOW (ref 4.22–5.81)
RDW: 14.4 % (ref 11.5–15.5)
WBC: 10.2 10*3/uL (ref 4.0–10.5)

## 2017-11-30 LAB — COMPREHENSIVE METABOLIC PANEL
ALBUMIN: 3.5 g/dL (ref 3.5–5.0)
ALT: 14 U/L (ref 0–44)
AST: 20 U/L (ref 15–41)
Alkaline Phosphatase: 82 U/L (ref 38–126)
Anion gap: 10 (ref 5–15)
BILIRUBIN TOTAL: 1 mg/dL (ref 0.3–1.2)
BUN: 10 mg/dL (ref 8–23)
CO2: 26 mmol/L (ref 22–32)
Calcium: 9.2 mg/dL (ref 8.9–10.3)
Chloride: 98 mmol/L (ref 98–111)
Creatinine, Ser: 0.67 mg/dL (ref 0.61–1.24)
GFR calc Af Amer: >60 mL/min (ref >60)
GFR calc non Af Amer: >60 mL/min (ref >60)
GLUCOSE: 127 mg/dL — AB (ref 70–99)
POTASSIUM: 4.2 mmol/L (ref 3.5–5.1)
SODIUM: 134 mmol/L — AB (ref 135–145)
TOTAL PROTEIN: 7.8 g/dL (ref 6.5–8.1)

## 2017-11-30 NOTE — ED Notes (Signed)
Glynda Jaeger ED charge nurse Melissa contacted and made aware of change in transport. Melissa state she is aware that pt is to present at ED.

## 2017-11-30 NOTE — Discharge Instructions (Addendum)
Go to [facility] Emergency department.  Let them know that Dr. Otila Back is expecting

## 2017-11-30 NOTE — ED Triage Notes (Signed)
C/o right knee pain, warm to touch- recent knee replacement Monday morning. Pain worse today- has been able to walk until today.

## 2017-11-30 NOTE — ED Notes (Signed)
Pt wife states she will take pt to [facility] by POV and wishes to be discharged.

## 2017-11-30 NOTE — ED Provider Notes (Addendum)
[place] EMERGENCY DEPARTMENT Provider Note   CSN: [id number] Arrival date & time: 11/30/17  0818     History   Chief Complaint Chief Complaint  Patient presents with  . Knee Pain    HPI Wayne [name] is a 67 y.o. male.  The history is provided by the patient. No language interpreter was used.  Knee Pain   This is a new problem. The current episode started 2 days ago. The problem occurs constantly. The problem has been gradually worsening. The pain is present in the right knee. The quality of the pain is described as aching. The pain is severe. Associated symptoms include limited range of motion. He has tried nothing for the symptoms. The treatment provided no relief. There has been no history of extremity trauma.  Pt had knee surgery. 11/11 at [facility]. Pt reports he was doing well after surgery but has had increased swelling and pain.  Pt reports unable to move and he feels sick now.    Past Medical History:  Diagnosis Date  . Basal cell carcinoma    MOHS  . Coronary artery disease 9/11   S/P MI and PTCA--X 4 STENTS  . Diverticulosis   . Hip pain    CHRONIC  . Hyperlipidemia   . LBBB (left bundle branch block)   . Neuropathy    IDIOPATHIC  . Sleep apnea 11/22/2016   does not wear CPAP    Patient Active Problem List   Diagnosis Date Noted  . OSA (obstructive sleep apnea) 12/13/2016  . S/P laminectomy 11/26/2016  . LBBB (left bundle branch block) 09/25/2015  . 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 Mixed hyperlipidemia 02/02/2013  . Coronary atherosclerosis of native coronary artery 02/02/2013  . Essential hypertension, benign 02/02/2013  . Unspecified hereditary and idiopathic peripheral neuropathy 02/02/2013    Past Surgical History:  Procedure Laterality Date  . CARDIAC CATHETERIZATION  09/11  . CHOLECYSTECTOMY    . JOINT REPLACEMENT  11/22/2016   right total knee 10/2016  . LUMBAR LAMINECTOMY/DECOMPRESSION MICRODISCECTOMY Left  11/26/2016   Procedure: Laminectomy for facet/synovial cyst - left - Lumbar four-Lumbar five;  Surgeon: Earnie Larsson, MD;  Location: [place] [facility];  Service: Neurosurgery;  Laterality: Left;  . RIGHT SHOULDER BONE TUMOR    . SLIPPED CAPITAL FEMORAL EPIPHYSIS PINNING          Home Medications    Prior to Admission medications   Medication Sig Start Date End Date Taking? Authorizing Provider  aspirin EC 81 MG tablet Take 1 tablet (81 mg total) by mouth daily. 01/24/14   Jettie Booze, MD  clopidogrel (PLAVIX) 75 MG tablet TAKE ONE TABLET BY MOUTH DAILY WITH BREAKFAST 10/20/17   Jettie Booze, MD  DULoxetine (CYMBALTA) 60 MG capsule Take 60 mg by mouth daily.    [provider]  gabapentin (NEURONTIN) 800 MG tablet Take 800-1,600 mg 2 (two) times daily by mouth. Takes 1 tablet in the morning and 2 tablets at bedtime 10/29/16   [provider]  lisinopril (PRINIVIL,ZESTRIL) 5 MG tablet TAKE 1 TABLET (5 MG TOTAL) BY MOUTH DAILY. 01/08/17   Jettie Booze, MD  metoprolol succinate (TOPROL-XL) 25 MG 24 hr tablet TAKE 1/2 TABLET BY MOUTH 2 TIMES DAILY 12/27/16   Jettie Booze, MD  nitroGLYCERIN (NITROSTAT) 0.4 MG SL tablet Dissolve one tablet under tongue every 5 minutes as needed for chest pain; up to 3 doses--call 911 if pain persist 12/27/16   Jettie Booze, MD  Omega-3 Fatty Acids (FISH OIL) 1000 MG CAPS Take 2 capsules (2,000 mg total) by mouth daily. Patient taking differently: Take 2,000 mg 2 (two) times daily by mouth.  02/25/13   Jettie Booze, MD  oxyCODONE-acetaminophen (PERCOCET) 10-325 MG per tablet Take 1 tablet every 6 (six) hours as needed by mouth for pain.     [provider]  rosuvastatin (CRESTOR) 40 MG tablet Take 1 tablet (40 mg total) by mouth every evening. 09/12/17   Richardson Dopp T, PA-C  zolpidem (AMBIEN) 5 MG tablet Take 5-10 mg at bedtime by mouth. Depends on insomnia if takes 5-10 mg 12/27/13   [provider]    Family History Family History  Problem Relation Age of Onset  . Cancer Mother   . Lung cancer Father     Social History Social History   Tobacco Use  . Smoking status: Never Smoker  . Smokeless tobacco: Never Used  Substance Use Topics  . Alcohol use: No    Frequency: Never  . Drug use: No     Allergies   Mirapex [pramipexole dihydrochloride] and Trazodone and nefazodone   Review of Systems Review of Systems  Musculoskeletal: Positive for arthralgias and joint swelling.  All other systems reviewed and are negative.    Physical Exam Updated Vital Signs Pulse 76   Temp 98.7 F (37.1 C) (Oral)   Resp 16   Ht 5\' 11"  (1.803 m)   Wt 99.8 kg   SpO2 96%   BMI 30.68 kg/m   Physical Exam  Constitutional: He appears well-developed and well-nourished.  Musculoskeletal: He exhibits tenderness.  Swollen right leg, pain with movement, nv and ns intact   Neurological: He is alert.  Skin: Skin is warm.  Psychiatric: He has a normal mood and affect.  Nursing note and vitals reviewed.    ED Treatments / Results  Labs (all labs ordered are listed, but only abnormal results are displayed) Labs Reviewed - No data to display  EKG None  Radiology No results found.  Procedures Procedures (including critical care time)  Medications Ordered in ED Medications - No data to display   Initial Impression / Assessment and Plan / ED Course  I have reviewed the triage vital signs and the nursing notes.  Pertinent labs & imaging results that were available during my care of the patient were reviewed by me and considered in my medical decision making (see chart for details).     I spoke to Dr. Otila Back who advised pt needs to be seen at [facility] for aspiration and possible Or wash out. Pt initially wanted to go by private vehicle.  Pt changed his mind and will go by [facility].   Pt changed his mind and he and his wife will drive to [facility].    Final Clinical  Impressions(s) / ED Diagnoses   Final diagnoses:  Acute pain of right knee    ED Discharge Orders    None       Sidney Ace 11/30/17 1031    Isla Pence, MD 11/30/17 [place], PA-C 11/30/17 1054    Sidney Ace 11/30/17 1214    Isla Pence, MD 11/30/17 1311

## 2017-11-30 NOTE — ED Notes (Signed)
Per Dr. Gilford Raid, leave IV in place.

## 2017-11-30 NOTE — ED Notes (Signed)
Pt and wife state they understands instructions and agree with follow up

## 2017-12-05 LAB — CULTURE, BLOOD (ROUTINE X 2): Culture: NO GROWTH

## 2017-12-08 DIAGNOSIS — Z96651 Presence of right artificial knee joint: Secondary | ICD-10-CM | POA: Diagnosis not present

## 2017-12-08 DIAGNOSIS — M25661 Stiffness of right knee, not elsewhere classified: Secondary | ICD-10-CM | POA: Diagnosis not present

## 2017-12-09 DIAGNOSIS — Z96643 Presence of artificial hip joint, bilateral: Secondary | ICD-10-CM | POA: Diagnosis not present

## 2017-12-09 DIAGNOSIS — Z471 Aftercare following joint replacement surgery: Secondary | ICD-10-CM | POA: Diagnosis not present

## 2017-12-09 DIAGNOSIS — Z96651 Presence of right artificial knee joint: Secondary | ICD-10-CM | POA: Diagnosis not present

## 2017-12-09 DIAGNOSIS — M1611 Unilateral primary osteoarthritis, right hip: Secondary | ICD-10-CM | POA: Diagnosis not present

## 2017-12-09 DIAGNOSIS — M25661 Stiffness of right knee, not elsewhere classified: Secondary | ICD-10-CM | POA: Diagnosis not present

## 2017-12-15 DIAGNOSIS — G4700 Insomnia, unspecified: Secondary | ICD-10-CM | POA: Diagnosis not present

## 2017-12-15 DIAGNOSIS — R69 Illness, unspecified: Secondary | ICD-10-CM | POA: Diagnosis not present

## 2017-12-15 DIAGNOSIS — Z96651 Presence of right artificial knee joint: Secondary | ICD-10-CM | POA: Diagnosis not present

## 2017-12-15 DIAGNOSIS — G609 Hereditary and idiopathic neuropathy, unspecified: Secondary | ICD-10-CM | POA: Diagnosis not present

## 2017-12-15 DIAGNOSIS — M25661 Stiffness of right knee, not elsewhere classified: Secondary | ICD-10-CM | POA: Diagnosis not present

## 2017-12-15 DIAGNOSIS — G894 Chronic pain syndrome: Secondary | ICD-10-CM | POA: Diagnosis not present

## 2017-12-17 DIAGNOSIS — M25661 Stiffness of right knee, not elsewhere classified: Secondary | ICD-10-CM | POA: Diagnosis not present

## 2017-12-17 DIAGNOSIS — Z96651 Presence of right artificial knee joint: Secondary | ICD-10-CM | POA: Diagnosis not present

## 2017-12-18 DIAGNOSIS — H2511 Age-related nuclear cataract, right eye: Secondary | ICD-10-CM | POA: Diagnosis not present

## 2017-12-18 DIAGNOSIS — Z961 Presence of intraocular lens: Secondary | ICD-10-CM | POA: Diagnosis not present

## 2017-12-18 DIAGNOSIS — H52223 Regular astigmatism, bilateral: Secondary | ICD-10-CM | POA: Diagnosis not present

## 2017-12-18 DIAGNOSIS — H25811 Combined forms of age-related cataract, right eye: Secondary | ICD-10-CM | POA: Diagnosis not present

## 2017-12-18 DIAGNOSIS — Z9849 Cataract extraction status, unspecified eye: Secondary | ICD-10-CM | POA: Diagnosis not present

## 2017-12-18 DIAGNOSIS — H524 Presbyopia: Secondary | ICD-10-CM | POA: Diagnosis not present

## 2017-12-18 DIAGNOSIS — H5203 Hypermetropia, bilateral: Secondary | ICD-10-CM | POA: Diagnosis not present

## 2017-12-22 DIAGNOSIS — M25661 Stiffness of right knee, not elsewhere classified: Secondary | ICD-10-CM | POA: Diagnosis not present

## 2017-12-22 DIAGNOSIS — G4731 Primary central sleep apnea: Secondary | ICD-10-CM | POA: Diagnosis not present

## 2017-12-22 DIAGNOSIS — Z96651 Presence of right artificial knee joint: Secondary | ICD-10-CM | POA: Diagnosis not present

## 2017-12-23 ENCOUNTER — Other Ambulatory Visit: Payer: Self-pay | Admitting: Interventional Cardiology

## 2017-12-23 DIAGNOSIS — G4731 Primary central sleep apnea: Secondary | ICD-10-CM | POA: Diagnosis not present

## 2017-12-24 DIAGNOSIS — M25661 Stiffness of right knee, not elsewhere classified: Secondary | ICD-10-CM | POA: Diagnosis not present

## 2017-12-24 DIAGNOSIS — Z96651 Presence of right artificial knee joint: Secondary | ICD-10-CM | POA: Diagnosis not present

## 2017-12-29 DIAGNOSIS — H43813 Vitreous degeneration, bilateral: Secondary | ICD-10-CM | POA: Diagnosis not present

## 2017-12-30 ENCOUNTER — Other Ambulatory Visit: Payer: Self-pay | Admitting: Interventional Cardiology

## 2018-01-05 DIAGNOSIS — M25661 Stiffness of right knee, not elsewhere classified: Secondary | ICD-10-CM | POA: Diagnosis not present

## 2018-01-05 DIAGNOSIS — Z96651 Presence of right artificial knee joint: Secondary | ICD-10-CM | POA: Diagnosis not present

## 2018-01-13 DIAGNOSIS — G4700 Insomnia, unspecified: Secondary | ICD-10-CM | POA: Diagnosis not present

## 2018-01-13 DIAGNOSIS — G894 Chronic pain syndrome: Secondary | ICD-10-CM | POA: Diagnosis not present

## 2018-01-13 DIAGNOSIS — G609 Hereditary and idiopathic neuropathy, unspecified: Secondary | ICD-10-CM | POA: Diagnosis not present

## 2018-01-13 DIAGNOSIS — R69 Illness, unspecified: Secondary | ICD-10-CM | POA: Diagnosis not present

## 2018-01-21 DIAGNOSIS — Z96642 Presence of left artificial hip joint: Secondary | ICD-10-CM | POA: Insufficient documentation

## 2018-01-23 DIAGNOSIS — G4731 Primary central sleep apnea: Secondary | ICD-10-CM | POA: Diagnosis not present

## 2018-01-30 DIAGNOSIS — Z0100 Encounter for examination of eyes and vision without abnormal findings: Secondary | ICD-10-CM | POA: Diagnosis not present

## 2018-02-10 DIAGNOSIS — R69 Illness, unspecified: Secondary | ICD-10-CM | POA: Diagnosis not present

## 2018-02-10 DIAGNOSIS — G894 Chronic pain syndrome: Secondary | ICD-10-CM | POA: Diagnosis not present

## 2018-02-10 DIAGNOSIS — G4700 Insomnia, unspecified: Secondary | ICD-10-CM | POA: Diagnosis not present

## 2018-02-10 DIAGNOSIS — G609 Hereditary and idiopathic neuropathy, unspecified: Secondary | ICD-10-CM | POA: Diagnosis not present

## 2018-02-23 DIAGNOSIS — G4731 Primary central sleep apnea: Secondary | ICD-10-CM | POA: Diagnosis not present

## 2018-03-16 DIAGNOSIS — G4700 Insomnia, unspecified: Secondary | ICD-10-CM | POA: Diagnosis not present

## 2018-03-16 DIAGNOSIS — R69 Illness, unspecified: Secondary | ICD-10-CM | POA: Diagnosis not present

## 2018-03-16 DIAGNOSIS — Z79891 Long term (current) use of opiate analgesic: Secondary | ICD-10-CM | POA: Diagnosis not present

## 2018-03-16 DIAGNOSIS — G894 Chronic pain syndrome: Secondary | ICD-10-CM | POA: Diagnosis not present

## 2018-03-16 DIAGNOSIS — G609 Hereditary and idiopathic neuropathy, unspecified: Secondary | ICD-10-CM | POA: Diagnosis not present

## 2018-03-24 DIAGNOSIS — G4731 Primary central sleep apnea: Secondary | ICD-10-CM | POA: Diagnosis not present

## 2018-03-25 DIAGNOSIS — G4731 Primary central sleep apnea: Secondary | ICD-10-CM | POA: Diagnosis not present

## 2018-04-20 DIAGNOSIS — G4700 Insomnia, unspecified: Secondary | ICD-10-CM | POA: Diagnosis not present

## 2018-04-20 DIAGNOSIS — G894 Chronic pain syndrome: Secondary | ICD-10-CM | POA: Diagnosis not present

## 2018-04-20 DIAGNOSIS — R69 Illness, unspecified: Secondary | ICD-10-CM | POA: Diagnosis not present

## 2018-04-20 DIAGNOSIS — G609 Hereditary and idiopathic neuropathy, unspecified: Secondary | ICD-10-CM | POA: Diagnosis not present

## 2018-04-22 ENCOUNTER — Other Ambulatory Visit: Payer: Self-pay | Admitting: Interventional Cardiology

## 2018-04-29 DIAGNOSIS — G4731 Primary central sleep apnea: Secondary | ICD-10-CM | POA: Diagnosis not present

## 2018-05-18 DIAGNOSIS — R69 Illness, unspecified: Secondary | ICD-10-CM | POA: Diagnosis not present

## 2018-05-25 DIAGNOSIS — G609 Hereditary and idiopathic neuropathy, unspecified: Secondary | ICD-10-CM | POA: Diagnosis not present

## 2018-05-25 DIAGNOSIS — I252 Old myocardial infarction: Secondary | ICD-10-CM | POA: Diagnosis not present

## 2018-05-25 DIAGNOSIS — I10 Essential (primary) hypertension: Secondary | ICD-10-CM | POA: Diagnosis not present

## 2018-05-25 DIAGNOSIS — G629 Polyneuropathy, unspecified: Secondary | ICD-10-CM | POA: Diagnosis not present

## 2018-05-25 DIAGNOSIS — G4700 Insomnia, unspecified: Secondary | ICD-10-CM | POA: Diagnosis not present

## 2018-05-25 DIAGNOSIS — G8929 Other chronic pain: Secondary | ICD-10-CM | POA: Diagnosis not present

## 2018-05-25 DIAGNOSIS — E785 Hyperlipidemia, unspecified: Secondary | ICD-10-CM | POA: Diagnosis not present

## 2018-05-25 DIAGNOSIS — G894 Chronic pain syndrome: Secondary | ICD-10-CM | POA: Diagnosis not present

## 2018-05-25 DIAGNOSIS — E669 Obesity, unspecified: Secondary | ICD-10-CM | POA: Diagnosis not present

## 2018-05-25 DIAGNOSIS — I25119 Atherosclerotic heart disease of native coronary artery with unspecified angina pectoris: Secondary | ICD-10-CM | POA: Diagnosis not present

## 2018-05-25 DIAGNOSIS — G4730 Sleep apnea, unspecified: Secondary | ICD-10-CM | POA: Diagnosis not present

## 2018-05-25 DIAGNOSIS — R69 Illness, unspecified: Secondary | ICD-10-CM | POA: Diagnosis not present

## 2018-05-26 ENCOUNTER — Other Ambulatory Visit: Payer: Self-pay | Admitting: Interventional Cardiology

## 2018-06-16 ENCOUNTER — Telehealth: Payer: Self-pay

## 2018-06-16 NOTE — Telephone Encounter (Signed)
Virtual Visit Pre-Appointment Phone Call  TELEPHONE CALL NOTE  Wayne [name] has been deemed a candidate for a follow-up tele-health visit to limit community exposure during the Covid-19 pandemic. I spoke with the patient via phone to ensure availability of phone/video source, confirm preferred email & phone number, and discuss instructions and expectations.  I reminded Wayne [name] to be prepared with any vital sign and/or heart rhythm information that could potentially be obtained via home monitoring, at the time of his visit. I reminded Wayne [name] to expect a phone call prior to his visit.  Patient agrees to consent below.  Cleon Gustin, RN 06/16/2018 12:34 PM    FULL LENGTH CONSENT FOR TELE-HEALTH VISIT   I hereby voluntarily request, consent and authorize [facility] and its employed or contracted physicians, physician assistants, nurse practitioners or other licensed health care professionals (the Practitioner), to provide me with telemedicine health care services (the "Services") as deemed necessary by the treating Practitioner. I acknowledge and consent to receive the Services by the Practitioner via telemedicine. I understand that the telemedicine visit will involve communicating with the Practitioner through live audiovisual communication technology and the disclosure of certain medical information by electronic transmission. I acknowledge that I have been given the opportunity to request an in-person assessment or other available alternative prior to the telemedicine visit and am voluntarily participating in the telemedicine visit.  I understand that I have the right to withhold or withdraw my consent to the use of telemedicine in the course of my care at any time, without affecting my right to future care or treatment, and that the Practitioner or I may terminate the telemedicine visit at any time. I understand that I have the right to inspect all information obtained  and/or recorded in the course of the telemedicine visit and may receive copies of available information for a reasonable fee.  I understand that some of the potential risks of receiving the Services via telemedicine include:  Wayne [name] Delay or interruption in medical evaluation due to technological equipment failure or disruption; . Information transmitted may not be sufficient (e.g. poor resolution of images) to allow for appropriate medical decision making by the Practitioner; and/or  . In rare instances, security protocols could fail, causing a breach of personal health information.  Furthermore, I acknowledge that it is my responsibility to provide information about my medical history, conditions and care that is complete and accurate to the best of my ability. I acknowledge that Practitioner's advice, recommendations, and/or decision may be based on factors not within their control, such as incomplete or inaccurate data provided by me or distortions of diagnostic images or specimens that may result from electronic transmissions. I understand that the practice of medicine is not an exact science and that Practitioner makes no warranties or guarantees regarding treatment outcomes. I acknowledge that I will receive a copy of this consent concurrently upon execution via email to the email address I last provided but may also request a printed copy by calling the office of [place].    I understand that my insurance will be billed for this visit.   I have read or had this consent read to me. . I understand the contents of this consent, which adequately explains the benefits and risks of the Services being provided via telemedicine.  . I have been provided ample opportunity to ask questions regarding this consent and the Services and have had my questions answered to my satisfaction. Wayne [name]  I give my informed consent for the services to be provided through the use of telemedicine in my medical care  By  participating in this telemedicine visit I agree to the above.

## 2018-06-22 ENCOUNTER — Ambulatory Visit: Payer: Medicare HMO | Admitting: Interventional Cardiology

## 2018-06-22 DIAGNOSIS — M654 Radial styloid tenosynovitis [de Quervain]: Secondary | ICD-10-CM | POA: Diagnosis not present

## 2018-06-26 ENCOUNTER — Other Ambulatory Visit: Payer: Self-pay | Admitting: Interventional Cardiology

## 2018-06-29 ENCOUNTER — Telehealth (INDEPENDENT_AMBULATORY_CARE_PROVIDER_SITE_OTHER): Payer: Medicare HMO | Admitting: Interventional Cardiology

## 2018-06-29 ENCOUNTER — Other Ambulatory Visit: Payer: Self-pay

## 2018-06-29 ENCOUNTER — Encounter: Payer: Self-pay | Admitting: Interventional Cardiology

## 2018-06-29 DIAGNOSIS — I252 Old myocardial infarction: Secondary | ICD-10-CM

## 2018-06-29 DIAGNOSIS — G894 Chronic pain syndrome: Secondary | ICD-10-CM | POA: Diagnosis not present

## 2018-06-29 DIAGNOSIS — G4700 Insomnia, unspecified: Secondary | ICD-10-CM | POA: Diagnosis not present

## 2018-06-29 DIAGNOSIS — R69 Illness, unspecified: Secondary | ICD-10-CM | POA: Diagnosis not present

## 2018-06-29 DIAGNOSIS — I25118 Atherosclerotic heart disease of native coronary artery with other forms of angina pectoris: Secondary | ICD-10-CM

## 2018-06-29 DIAGNOSIS — M1712 Unilateral primary osteoarthritis, left knee: Secondary | ICD-10-CM | POA: Diagnosis not present

## 2018-06-29 DIAGNOSIS — I447 Left bundle-branch block, unspecified: Secondary | ICD-10-CM | POA: Diagnosis not present

## 2018-06-29 DIAGNOSIS — G609 Hereditary and idiopathic neuropathy, unspecified: Secondary | ICD-10-CM | POA: Diagnosis not present

## 2018-06-29 DIAGNOSIS — I10 Essential (primary) hypertension: Secondary | ICD-10-CM

## 2018-06-29 DIAGNOSIS — E782 Mixed hyperlipidemia: Secondary | ICD-10-CM | POA: Diagnosis not present

## 2018-06-29 MED ORDER — LISINOPRIL 5 MG PO TABS: 5.0000 mg | ORAL_TABLET | Freq: Every day | ORAL | 3 refills | Status: DC

## 2018-06-29 NOTE — Progress Notes (Signed)
Virtual Visit via Video Note   This visit type was conducted due to national recommendations for restrictions regarding the COVID-19 Pandemic (e.g. social distancing) in an effort to limit this patient's exposure and mitigate transmission in our community.  Due to his co-morbid illnesses, this patient is at least at moderate risk for complications without adequate follow up.  This format is felt to be most appropriate for this patient at this time.  All issues noted in this document were discussed and addressed.  A limited physical exam was performed with this format.  Please refer to the patient's chart for his consent to telehealth for [facility].   Date:  06/29/2018   ID:  [name], DOB 1950-11-26, MRN [id number]  Patient Location: Home Provider Location: Home  PCP:  Gaynelle Arabian, MD  Cardiologist:  Larae Grooms, MD  Electrophysiologist:  None   Evaluation Performed:  Follow-Up Visit  Chief Complaint:  CAD  History of Present Illness:    Wayne [name] is a 68 y.o. male with coronary artery disease status post multivessel PCI in 2011, hyperlipidemia, left bundle branch block, sleep apnea.  He had hip surgery and knee surgery in 2019.  Both joints replaced.   He is wearing a mask when he goes out.  Denies : Chest pain. Dizziness. Leg edema. Nitroglycerin use. Orthopnea. Palpitations. Paroxysmal nocturnal dyspnea. Shortness of breath. Syncope.   Still has chronic back pain. He has had repeated cortisone injections.   The patient does not have symptoms concerning for COVID-19 infection (fever, chills, cough, or new shortness of breath).    Past Medical History:  Diagnosis Date  . Basal cell carcinoma    MOHS  . Coronary artery disease 9/11   S/P MI and PTCA--X 4 STENTS  . Diverticulosis   . Hip pain    CHRONIC  . Hyperlipidemia   . LBBB (left bundle branch block)   . Neuropathy    IDIOPATHIC  . Sleep apnea 11/22/2016   does not wear CPAP   Past  Surgical History:  Procedure Laterality Date  . CARDIAC CATHETERIZATION  09/11  . CHOLECYSTECTOMY    . JOINT REPLACEMENT  11/22/2016   right total knee 10/2016  . LUMBAR LAMINECTOMY/DECOMPRESSION MICRODISCECTOMY Left 11/26/2016   Procedure: Laminectomy for facet/synovial cyst - left - Lumbar four-Lumbar five;  Surgeon: Earnie Larsson, MD;  Location: [place];  Service: Neurosurgery;  Laterality: Left;  . RIGHT SHOULDER BONE TUMOR    . SLIPPED CAPITAL FEMORAL EPIPHYSIS PINNING       Current Meds  Medication Sig  . aspirin EC 81 MG tablet Take 1 tablet (81 mg total) by mouth daily.  . clopidogrel (PLAVIX) 75 MG tablet Take 1 tablet (75 mg total) by mouth daily.  [name] docusate sodium (COLACE) 100 MG capsule Take 100 mg by mouth as needed.  . DULoxetine (CYMBALTA) 60 MG capsule Take 60 mg by mouth daily.  [name] lisinopril (ZESTRIL) 5 MG tablet TAKE ONE TABLET BY MOUTH DAILY  . metoprolol succinate (TOPROL-XL) 25 MG 24 hr tablet TAKE ONE-HALF TABLET BY MOUTH TWO TIMES A DAY  . nitroGLYCERIN (NITROSTAT) 0.4 MG SL tablet Dissolve one tablet under tongue every 5 minutes as needed for chest pain; up to 3 doses--call 911 if pain persist  . Omega-3 Fatty Acids (FISH OIL) 1000 MG CAPS Take 2 capsules (2,000 mg total) by mouth daily.  [name] oxyCODONE (OXY IR/ROXICODONE) 5 MG immediate release tablet Take 5 mg by mouth as needed.  [name] oxyCODONE-acetaminophen (  PERCOCET) 10-325 MG per tablet Take 1 tablet every 6 (six) hours as needed by mouth for pain.   . pregabalin (LYRICA) 75 MG capsule Take 75 mg by mouth daily.  . rosuvastatin (CRESTOR) 40 MG tablet Take 1 tablet (40 mg total) by mouth every evening.  . Vitamin D, Ergocalciferol, (DRISDOL) 1.25 MG (50000 UT) CAPS capsule Take 50,000 Units by mouth once a week.  . zolpidem (AMBIEN) 5 MG tablet Take 5-10 mg at bedtime by mouth. Depends on insomnia if takes 5-10 mg     Allergies:   Mirapex [pramipexole dihydrochloride] and Trazodone and nefazodone   Social History    Tobacco Use  . Smoking status: Never Smoker  . Smokeless tobacco: Never Used  Substance Use Topics  . Alcohol use: No    Frequency: Never  . Drug use: No     Family Hx: The patient's family history includes Cancer in his mother; Lung cancer in his father.  ROS:   Please see the history of present illness.    Some residual knee pain All other systems reviewed and are negative.   Prior CV studies:   The following studies were reviewed today:    Labs/Other Tests and Data Reviewed:    EKG:  An ECG dated 6/19 was personally reviewed today and demonstrated:  NSR, LBBB  Recent Labs: 11/30/2017: ALT 14; BUN 10; Creatinine, Ser 0.67; Hemoglobin 11.3; Platelets 291; Potassium 4.2; Sodium 134   Recent Lipid Panel Lab Results  Component Value Date/Time   CHOL 128 07/25/2014 08:06 AM   TRIG 94.0 07/25/2014 08:06 AM   HDL 38.70 (L) 07/25/2014 08:06 AM   CHOLHDL 3 07/25/2014 08:06 AM   LDLCALC 71 07/25/2014 08:06 AM   LDLDIRECT 76.8 02/22/2013 07:48 AM    Wt Readings from Last 3 Encounters:  06/29/18 215 lb (97.5 kg)  11/30/17 220 lb (99.8 kg)  08/18/17 219 lb (99.3 kg)     Objective:    Vital Signs:  BP 117/65   Pulse 70   Ht 5\' 11"  (1.803 m)   Wt 215 lb (97.5 kg)   BMI 29.99 kg/m    VITAL SIGNS:  reviewed GEN:  no acute distress RESPIRATORY:  normal respiratory effort, symmetric expansion PSYCH:  normal affect exam limited by video format  ASSESSMENT & PLAN:    1. CAD: No angina.  No bleeding issues.  Back on clopidogrel. Needs to increase exercise.  2. Hyperlipidemia: LDL 69 in 2019.  COntinue lipid lowering therapy.  3. LBBB: rate dependent.  4. PVCs: rare.   5. Labs to be checked with PMD.   COVID-19 Education: The signs and symptoms of COVID-19 were discussed with the patient and how to seek care for testing (follow up with PCP or arrange E-visit).  The importance of social distancing was discussed today.  Time:   Today, I have spent 25 minutes with  the patient with telehealth technology discussing the above problems.     Medication Adjustments/Labs and Tests Ordered: Current medicines are reviewed at length with the patient today.  Concerns regarding medicines are outlined above.   Tests Ordered: No orders of the defined types were placed in this encounter.   Medication Changes: No orders of the defined types were placed in this encounter.   Follow Up:  Virtual Visit or In Person in 1 year(s)  Signed, Larae Grooms, MD  06/29/2018 11:17 AM    [place]

## 2018-06-29 NOTE — Patient Instructions (Signed)

## 2018-07-07 DIAGNOSIS — R7309 Other abnormal glucose: Secondary | ICD-10-CM | POA: Diagnosis not present

## 2018-07-07 DIAGNOSIS — M25559 Pain in unspecified hip: Secondary | ICD-10-CM | POA: Diagnosis not present

## 2018-07-07 DIAGNOSIS — E782 Mixed hyperlipidemia: Secondary | ICD-10-CM | POA: Diagnosis not present

## 2018-07-07 DIAGNOSIS — Z0000 Encounter for general adult medical examination without abnormal findings: Secondary | ICD-10-CM | POA: Diagnosis not present

## 2018-07-07 DIAGNOSIS — I2510 Atherosclerotic heart disease of native coronary artery without angina pectoris: Secondary | ICD-10-CM | POA: Diagnosis not present

## 2018-07-07 DIAGNOSIS — Z79899 Other long term (current) drug therapy: Secondary | ICD-10-CM | POA: Diagnosis not present

## 2018-07-07 DIAGNOSIS — G629 Polyneuropathy, unspecified: Secondary | ICD-10-CM | POA: Diagnosis not present

## 2018-07-07 DIAGNOSIS — Z1389 Encounter for screening for other disorder: Secondary | ICD-10-CM | POA: Diagnosis not present

## 2018-07-07 DIAGNOSIS — I208 Other forms of angina pectoris: Secondary | ICD-10-CM | POA: Diagnosis not present

## 2018-07-10 ENCOUNTER — Encounter: Payer: Self-pay | Admitting: Interventional Cardiology

## 2018-07-10 DIAGNOSIS — Z79899 Other long term (current) drug therapy: Secondary | ICD-10-CM | POA: Diagnosis not present

## 2018-07-10 DIAGNOSIS — R7309 Other abnormal glucose: Secondary | ICD-10-CM | POA: Diagnosis not present

## 2018-07-10 DIAGNOSIS — E782 Mixed hyperlipidemia: Secondary | ICD-10-CM | POA: Diagnosis not present

## 2018-07-22 DIAGNOSIS — I447 Left bundle-branch block, unspecified: Secondary | ICD-10-CM | POA: Diagnosis not present

## 2018-07-22 DIAGNOSIS — Z96651 Presence of right artificial knee joint: Secondary | ICD-10-CM | POA: Diagnosis not present

## 2018-07-22 DIAGNOSIS — G4733 Obstructive sleep apnea (adult) (pediatric): Secondary | ICD-10-CM | POA: Diagnosis not present

## 2018-07-22 DIAGNOSIS — I252 Old myocardial infarction: Secondary | ICD-10-CM | POA: Diagnosis not present

## 2018-07-22 DIAGNOSIS — M1612 Unilateral primary osteoarthritis, left hip: Secondary | ICD-10-CM | POA: Diagnosis not present

## 2018-07-22 DIAGNOSIS — Z955 Presence of coronary angioplasty implant and graft: Secondary | ICD-10-CM | POA: Diagnosis not present

## 2018-07-22 DIAGNOSIS — M1712 Unilateral primary osteoarthritis, left knee: Secondary | ICD-10-CM | POA: Diagnosis not present

## 2018-07-22 DIAGNOSIS — M25462 Effusion, left knee: Secondary | ICD-10-CM | POA: Diagnosis not present

## 2018-07-22 DIAGNOSIS — I10 Essential (primary) hypertension: Secondary | ICD-10-CM | POA: Diagnosis not present

## 2018-07-22 DIAGNOSIS — M1711 Unilateral primary osteoarthritis, right knee: Secondary | ICD-10-CM | POA: Diagnosis not present

## 2018-07-22 DIAGNOSIS — Z96642 Presence of left artificial hip joint: Secondary | ICD-10-CM | POA: Diagnosis not present

## 2018-07-25 ENCOUNTER — Other Ambulatory Visit: Payer: Self-pay | Admitting: Physician Assistant

## 2018-07-27 ENCOUNTER — Other Ambulatory Visit: Payer: Self-pay | Admitting: Interventional Cardiology

## 2018-08-03 DIAGNOSIS — G894 Chronic pain syndrome: Secondary | ICD-10-CM | POA: Diagnosis not present

## 2018-08-03 DIAGNOSIS — G609 Hereditary and idiopathic neuropathy, unspecified: Secondary | ICD-10-CM | POA: Diagnosis not present

## 2018-08-03 DIAGNOSIS — R69 Illness, unspecified: Secondary | ICD-10-CM | POA: Diagnosis not present

## 2018-08-03 DIAGNOSIS — G4700 Insomnia, unspecified: Secondary | ICD-10-CM | POA: Diagnosis not present

## 2018-09-03 DIAGNOSIS — R69 Illness, unspecified: Secondary | ICD-10-CM | POA: Diagnosis not present

## 2018-09-07 DIAGNOSIS — G894 Chronic pain syndrome: Secondary | ICD-10-CM | POA: Diagnosis not present

## 2018-09-07 DIAGNOSIS — G4700 Insomnia, unspecified: Secondary | ICD-10-CM | POA: Diagnosis not present

## 2018-09-07 DIAGNOSIS — G609 Hereditary and idiopathic neuropathy, unspecified: Secondary | ICD-10-CM | POA: Diagnosis not present

## 2018-09-07 DIAGNOSIS — R69 Illness, unspecified: Secondary | ICD-10-CM | POA: Diagnosis not present

## 2018-09-09 ENCOUNTER — Other Ambulatory Visit: Payer: Self-pay | Admitting: Physician Assistant

## 2018-10-06 DIAGNOSIS — R69 Illness, unspecified: Secondary | ICD-10-CM | POA: Diagnosis not present

## 2018-10-17 ENCOUNTER — Other Ambulatory Visit: Payer: Self-pay | Admitting: Interventional Cardiology

## 2018-10-19 DIAGNOSIS — L821 Other seborrheic keratosis: Secondary | ICD-10-CM | POA: Diagnosis not present

## 2018-10-19 DIAGNOSIS — L570 Actinic keratosis: Secondary | ICD-10-CM | POA: Diagnosis not present

## 2018-10-19 DIAGNOSIS — D224 Melanocytic nevi of scalp and neck: Secondary | ICD-10-CM | POA: Diagnosis not present

## 2018-10-19 DIAGNOSIS — D485 Neoplasm of uncertain behavior of skin: Secondary | ICD-10-CM | POA: Diagnosis not present

## 2018-10-19 DIAGNOSIS — D1801 Hemangioma of skin and subcutaneous tissue: Secondary | ICD-10-CM | POA: Diagnosis not present

## 2018-10-19 DIAGNOSIS — L282 Other prurigo: Secondary | ICD-10-CM | POA: Diagnosis not present

## 2018-10-19 DIAGNOSIS — L281 Prurigo nodularis: Secondary | ICD-10-CM | POA: Diagnosis not present

## 2018-10-19 DIAGNOSIS — L812 Freckles: Secondary | ICD-10-CM | POA: Diagnosis not present

## 2018-10-19 DIAGNOSIS — Z85828 Personal history of other malignant neoplasm of skin: Secondary | ICD-10-CM | POA: Diagnosis not present

## 2018-10-19 DIAGNOSIS — L720 Epidermal cyst: Secondary | ICD-10-CM | POA: Diagnosis not present

## 2018-10-19 DIAGNOSIS — D225 Melanocytic nevi of trunk: Secondary | ICD-10-CM | POA: Diagnosis not present

## 2018-10-19 DIAGNOSIS — D692 Other nonthrombocytopenic purpura: Secondary | ICD-10-CM | POA: Diagnosis not present

## 2018-10-26 DIAGNOSIS — R69 Illness, unspecified: Secondary | ICD-10-CM | POA: Diagnosis not present

## 2018-10-27 DIAGNOSIS — G894 Chronic pain syndrome: Secondary | ICD-10-CM | POA: Diagnosis not present

## 2018-10-27 DIAGNOSIS — M1712 Unilateral primary osteoarthritis, left knee: Secondary | ICD-10-CM | POA: Diagnosis not present

## 2018-10-27 DIAGNOSIS — R69 Illness, unspecified: Secondary | ICD-10-CM | POA: Diagnosis not present

## 2018-10-27 DIAGNOSIS — G609 Hereditary and idiopathic neuropathy, unspecified: Secondary | ICD-10-CM | POA: Diagnosis not present

## 2018-10-27 DIAGNOSIS — G4700 Insomnia, unspecified: Secondary | ICD-10-CM | POA: Diagnosis not present

## 2018-11-02 DIAGNOSIS — M1712 Unilateral primary osteoarthritis, left knee: Secondary | ICD-10-CM | POA: Diagnosis not present

## 2018-11-09 DIAGNOSIS — M1712 Unilateral primary osteoarthritis, left knee: Secondary | ICD-10-CM | POA: Diagnosis not present

## 2018-11-25 DIAGNOSIS — M25552 Pain in left hip: Secondary | ICD-10-CM | POA: Diagnosis not present

## 2018-11-25 DIAGNOSIS — M1712 Unilateral primary osteoarthritis, left knee: Secondary | ICD-10-CM | POA: Diagnosis not present

## 2018-11-25 DIAGNOSIS — Z96651 Presence of right artificial knee joint: Secondary | ICD-10-CM | POA: Diagnosis not present

## 2018-11-25 DIAGNOSIS — Z96642 Presence of left artificial hip joint: Secondary | ICD-10-CM | POA: Diagnosis not present

## 2018-11-25 DIAGNOSIS — M1711 Unilateral primary osteoarthritis, right knee: Secondary | ICD-10-CM | POA: Diagnosis not present

## 2018-12-01 DIAGNOSIS — R69 Illness, unspecified: Secondary | ICD-10-CM | POA: Diagnosis not present

## 2018-12-07 DIAGNOSIS — G894 Chronic pain syndrome: Secondary | ICD-10-CM | POA: Diagnosis not present

## 2018-12-07 DIAGNOSIS — G4700 Insomnia, unspecified: Secondary | ICD-10-CM | POA: Diagnosis not present

## 2018-12-07 DIAGNOSIS — G609 Hereditary and idiopathic neuropathy, unspecified: Secondary | ICD-10-CM | POA: Diagnosis not present

## 2018-12-07 DIAGNOSIS — R69 Illness, unspecified: Secondary | ICD-10-CM | POA: Diagnosis not present

## 2019-01-05 DIAGNOSIS — R69 Illness, unspecified: Secondary | ICD-10-CM | POA: Diagnosis not present

## 2019-01-05 DIAGNOSIS — G4700 Insomnia, unspecified: Secondary | ICD-10-CM | POA: Diagnosis not present

## 2019-01-05 DIAGNOSIS — G609 Hereditary and idiopathic neuropathy, unspecified: Secondary | ICD-10-CM | POA: Diagnosis not present

## 2019-01-05 DIAGNOSIS — M654 Radial styloid tenosynovitis [de Quervain]: Secondary | ICD-10-CM | POA: Diagnosis not present

## 2019-01-05 DIAGNOSIS — G894 Chronic pain syndrome: Secondary | ICD-10-CM | POA: Diagnosis not present

## 2019-01-12 DIAGNOSIS — R7303 Prediabetes: Secondary | ICD-10-CM | POA: Diagnosis not present

## 2019-02-08 DIAGNOSIS — G894 Chronic pain syndrome: Secondary | ICD-10-CM | POA: Diagnosis not present

## 2019-02-08 DIAGNOSIS — G4700 Insomnia, unspecified: Secondary | ICD-10-CM | POA: Diagnosis not present

## 2019-02-08 DIAGNOSIS — R69 Illness, unspecified: Secondary | ICD-10-CM | POA: Diagnosis not present

## 2019-02-08 DIAGNOSIS — G609 Hereditary and idiopathic neuropathy, unspecified: Secondary | ICD-10-CM | POA: Diagnosis not present

## 2019-02-09 DIAGNOSIS — Z1211 Encounter for screening for malignant neoplasm of colon: Secondary | ICD-10-CM | POA: Diagnosis not present

## 2019-02-09 DIAGNOSIS — Z90710 Acquired absence of both cervix and uterus: Secondary | ICD-10-CM | POA: Diagnosis not present

## 2019-02-09 DIAGNOSIS — Z20828 Contact with and (suspected) exposure to other viral communicable diseases: Secondary | ICD-10-CM | POA: Diagnosis not present

## 2019-03-12 DIAGNOSIS — Z03818 Encounter for observation for suspected exposure to other biological agents ruled out: Secondary | ICD-10-CM | POA: Diagnosis not present

## 2019-03-12 DIAGNOSIS — Z20828 Contact with and (suspected) exposure to other viral communicable diseases: Secondary | ICD-10-CM | POA: Diagnosis not present

## 2019-03-15 DIAGNOSIS — G609 Hereditary and idiopathic neuropathy, unspecified: Secondary | ICD-10-CM | POA: Diagnosis not present

## 2019-03-15 DIAGNOSIS — R69 Illness, unspecified: Secondary | ICD-10-CM | POA: Diagnosis not present

## 2019-03-15 DIAGNOSIS — G4700 Insomnia, unspecified: Secondary | ICD-10-CM | POA: Diagnosis not present

## 2019-03-15 DIAGNOSIS — G894 Chronic pain syndrome: Secondary | ICD-10-CM | POA: Diagnosis not present

## 2019-03-23 ENCOUNTER — Other Ambulatory Visit: Payer: Self-pay | Admitting: Interventional Cardiology

## 2019-04-19 DIAGNOSIS — G4700 Insomnia, unspecified: Secondary | ICD-10-CM | POA: Diagnosis not present

## 2019-04-19 DIAGNOSIS — G609 Hereditary and idiopathic neuropathy, unspecified: Secondary | ICD-10-CM | POA: Diagnosis not present

## 2019-04-19 DIAGNOSIS — R69 Illness, unspecified: Secondary | ICD-10-CM | POA: Diagnosis not present

## 2019-04-19 DIAGNOSIS — G894 Chronic pain syndrome: Secondary | ICD-10-CM | POA: Diagnosis not present

## 2019-05-10 DIAGNOSIS — E785 Hyperlipidemia, unspecified: Secondary | ICD-10-CM | POA: Diagnosis not present

## 2019-05-10 DIAGNOSIS — I25119 Atherosclerotic heart disease of native coronary artery with unspecified angina pectoris: Secondary | ICD-10-CM | POA: Diagnosis not present

## 2019-05-10 DIAGNOSIS — I10 Essential (primary) hypertension: Secondary | ICD-10-CM | POA: Diagnosis not present

## 2019-05-10 DIAGNOSIS — G8929 Other chronic pain: Secondary | ICD-10-CM | POA: Diagnosis not present

## 2019-05-10 DIAGNOSIS — I499 Cardiac arrhythmia, unspecified: Secondary | ICD-10-CM | POA: Diagnosis not present

## 2019-05-10 DIAGNOSIS — G629 Polyneuropathy, unspecified: Secondary | ICD-10-CM | POA: Diagnosis not present

## 2019-05-10 DIAGNOSIS — I252 Old myocardial infarction: Secondary | ICD-10-CM | POA: Diagnosis not present

## 2019-05-10 DIAGNOSIS — R69 Illness, unspecified: Secondary | ICD-10-CM | POA: Diagnosis not present

## 2019-05-10 DIAGNOSIS — G4730 Sleep apnea, unspecified: Secondary | ICD-10-CM | POA: Diagnosis not present

## 2019-05-10 DIAGNOSIS — G4700 Insomnia, unspecified: Secondary | ICD-10-CM | POA: Diagnosis not present

## 2019-05-28 DIAGNOSIS — R69 Illness, unspecified: Secondary | ICD-10-CM | POA: Diagnosis not present

## 2019-05-31 DIAGNOSIS — Z79891 Long term (current) use of opiate analgesic: Secondary | ICD-10-CM | POA: Diagnosis not present

## 2019-05-31 DIAGNOSIS — G894 Chronic pain syndrome: Secondary | ICD-10-CM | POA: Diagnosis not present

## 2019-05-31 DIAGNOSIS — G609 Hereditary and idiopathic neuropathy, unspecified: Secondary | ICD-10-CM | POA: Diagnosis not present

## 2019-05-31 DIAGNOSIS — R69 Illness, unspecified: Secondary | ICD-10-CM | POA: Diagnosis not present

## 2019-05-31 DIAGNOSIS — G4700 Insomnia, unspecified: Secondary | ICD-10-CM | POA: Diagnosis not present

## 2019-06-02 DIAGNOSIS — M1712 Unilateral primary osteoarthritis, left knee: Secondary | ICD-10-CM | POA: Diagnosis not present

## 2019-06-07 DIAGNOSIS — R69 Illness, unspecified: Secondary | ICD-10-CM | POA: Diagnosis not present

## 2019-06-08 DIAGNOSIS — M1712 Unilateral primary osteoarthritis, left knee: Secondary | ICD-10-CM | POA: Diagnosis not present

## 2019-06-16 ENCOUNTER — Other Ambulatory Visit: Payer: Self-pay | Admitting: Interventional Cardiology

## 2019-06-17 DIAGNOSIS — M1712 Unilateral primary osteoarthritis, left knee: Secondary | ICD-10-CM | POA: Diagnosis not present

## 2019-06-18 ENCOUNTER — Other Ambulatory Visit: Payer: Self-pay | Admitting: Interventional Cardiology

## 2019-06-21 ENCOUNTER — Other Ambulatory Visit: Payer: Self-pay | Admitting: Interventional Cardiology

## 2019-07-05 DIAGNOSIS — G609 Hereditary and idiopathic neuropathy, unspecified: Secondary | ICD-10-CM | POA: Diagnosis not present

## 2019-07-05 DIAGNOSIS — R69 Illness, unspecified: Secondary | ICD-10-CM | POA: Diagnosis not present

## 2019-07-05 DIAGNOSIS — G894 Chronic pain syndrome: Secondary | ICD-10-CM | POA: Diagnosis not present

## 2019-07-05 DIAGNOSIS — G4700 Insomnia, unspecified: Secondary | ICD-10-CM | POA: Diagnosis not present

## 2019-07-16 ENCOUNTER — Other Ambulatory Visit: Payer: Self-pay | Admitting: Interventional Cardiology

## 2019-07-19 ENCOUNTER — Other Ambulatory Visit: Payer: Self-pay | Admitting: Interventional Cardiology

## 2019-07-24 ENCOUNTER — Other Ambulatory Visit: Payer: Self-pay | Admitting: Interventional Cardiology

## 2019-07-30 ENCOUNTER — Telehealth: Payer: Self-pay | Admitting: Interventional Cardiology

## 2019-07-30 DIAGNOSIS — I208 Other forms of angina pectoris: Secondary | ICD-10-CM | POA: Diagnosis not present

## 2019-07-30 DIAGNOSIS — M25559 Pain in unspecified hip: Secondary | ICD-10-CM | POA: Diagnosis not present

## 2019-07-30 DIAGNOSIS — Z1389 Encounter for screening for other disorder: Secondary | ICD-10-CM | POA: Diagnosis not present

## 2019-07-30 DIAGNOSIS — R7309 Other abnormal glucose: Secondary | ICD-10-CM | POA: Diagnosis not present

## 2019-07-30 DIAGNOSIS — Z79899 Other long term (current) drug therapy: Secondary | ICD-10-CM | POA: Diagnosis not present

## 2019-07-30 DIAGNOSIS — Z125 Encounter for screening for malignant neoplasm of prostate: Secondary | ICD-10-CM | POA: Diagnosis not present

## 2019-07-30 DIAGNOSIS — I25119 Atherosclerotic heart disease of native coronary artery with unspecified angina pectoris: Secondary | ICD-10-CM | POA: Diagnosis not present

## 2019-07-30 DIAGNOSIS — G629 Polyneuropathy, unspecified: Secondary | ICD-10-CM | POA: Diagnosis not present

## 2019-07-30 DIAGNOSIS — E782 Mixed hyperlipidemia: Secondary | ICD-10-CM | POA: Diagnosis not present

## 2019-07-30 DIAGNOSIS — Z0000 Encounter for general adult medical examination without abnormal findings: Secondary | ICD-10-CM | POA: Diagnosis not present

## 2019-07-30 MED ORDER — METOPROLOL SUCCINATE ER 25 MG PO TB24: ORAL_TABLET | ORAL | 0 refills | Status: DC

## 2019-07-30 NOTE — Telephone Encounter (Signed)
  *  STAT* If patient is at the pharmacy, call can be transferred to refill team.   1. Which medications need to be refilled? (please list name of each medication and dose if known)metoprolol succinate (TOPROL-XL) 25 MG 24 hr tablet  2. Which pharmacy/location (including street and city if local pharmacy) is medication to be sent to? [facility] [address], [place] [address]  3. Do they need a 30 day or 90 day supply? 30 days   Pt said he will be out of town and needs refill today or tomorrow.

## 2019-07-30 NOTE — Telephone Encounter (Signed)
Pt's medication was sent to pt's pharmacy as requested. Confirmation received.  °

## 2019-08-05 ENCOUNTER — Telehealth: Payer: Self-pay | Admitting: Interventional Cardiology

## 2019-08-05 NOTE — Telephone Encounter (Signed)
Pt c/o medication issue:  1. Name of Medication: metoprolol succinate (TOPROL-XL) 25 MG 24 hr tablet  2. How are you currently taking this medication (dosage and times per day)? Half a tablet twice a day  3. Are you having a reaction (difficulty breathing--STAT)? no  4. What is your medication issue? Patient states he is out of town and forgot his medication. He states he only has one pill to make it to Saturday and would like to know if it is okay for him to skip a dose.

## 2019-08-05 NOTE — Telephone Encounter (Signed)
I spoke with patient. He is out of town and will miss PM dose of Toprol tomorrow. Does not want refill sent to his current location as he will be back in town on Saturday.  I told him it should be OK to miss this dose and that heart rate and BP may be more elevated than normal.

## 2019-08-07 ENCOUNTER — Other Ambulatory Visit: Payer: Self-pay | Admitting: Interventional Cardiology

## 2019-08-09 DIAGNOSIS — G894 Chronic pain syndrome: Secondary | ICD-10-CM | POA: Diagnosis not present

## 2019-08-09 DIAGNOSIS — G609 Hereditary and idiopathic neuropathy, unspecified: Secondary | ICD-10-CM | POA: Diagnosis not present

## 2019-08-09 DIAGNOSIS — M654 Radial styloid tenosynovitis [de Quervain]: Secondary | ICD-10-CM | POA: Diagnosis not present

## 2019-08-09 DIAGNOSIS — G4700 Insomnia, unspecified: Secondary | ICD-10-CM | POA: Diagnosis not present

## 2019-08-09 DIAGNOSIS — R69 Illness, unspecified: Secondary | ICD-10-CM | POA: Diagnosis not present

## 2019-08-25 ENCOUNTER — Other Ambulatory Visit: Payer: Self-pay | Admitting: Interventional Cardiology

## 2019-08-26 ENCOUNTER — Other Ambulatory Visit: Payer: Self-pay | Admitting: Interventional Cardiology

## 2019-09-01 DIAGNOSIS — R040 Epistaxis: Secondary | ICD-10-CM | POA: Insufficient documentation

## 2019-09-01 DIAGNOSIS — G4733 Obstructive sleep apnea (adult) (pediatric): Secondary | ICD-10-CM | POA: Diagnosis not present

## 2019-09-02 ENCOUNTER — Ambulatory Visit: Payer: Medicare HMO | Admitting: Interventional Cardiology

## 2019-09-02 ENCOUNTER — Other Ambulatory Visit: Payer: Self-pay

## 2019-09-02 ENCOUNTER — Encounter: Payer: Self-pay | Admitting: Interventional Cardiology

## 2019-09-02 VITALS — BP 130/70 | HR 67 | Ht 71.0 in | Wt 235.6 lb

## 2019-09-02 DIAGNOSIS — E782 Mixed hyperlipidemia: Secondary | ICD-10-CM

## 2019-09-02 DIAGNOSIS — I10 Essential (primary) hypertension: Secondary | ICD-10-CM | POA: Diagnosis not present

## 2019-09-02 DIAGNOSIS — I252 Old myocardial infarction: Secondary | ICD-10-CM | POA: Diagnosis not present

## 2019-09-02 DIAGNOSIS — R002 Palpitations: Secondary | ICD-10-CM | POA: Diagnosis not present

## 2019-09-02 DIAGNOSIS — I447 Left bundle-branch block, unspecified: Secondary | ICD-10-CM

## 2019-09-02 DIAGNOSIS — I25118 Atherosclerotic heart disease of native coronary artery with other forms of angina pectoris: Secondary | ICD-10-CM | POA: Diagnosis not present

## 2019-09-02 MED ORDER — NITROGLYCERIN 0.4 MG SL SUBL: SUBLINGUAL_TABLET | SUBLINGUAL | 3 refills | Status: DC

## 2019-09-02 NOTE — Progress Notes (Signed)
Cardiology Office Note   Date:  09/02/2019   ID:  [name], DOB 09-Jul-1950, MRN [id number]  PCP:  Gaynelle Arabian, MD    No chief complaint on file.  CAD  Wt Readings from Last 3 Encounters:  09/02/19 235 lb 9.6 oz (106.9 kg)  06/29/18 215 lb (97.5 kg)  11/30/17 220 lb (99.8 kg)       History of Present Illness: Wayne [name] is a 69 y.o. male  with coronary artery disease status post multivessel PCI in 2011, hyperlipidemia, left bundle branch block, sleep apnea.  He had hip surgery and knee surgery in 2019.  Both joints replaced.   Still has chronic back pain. He has had repeated cortisone injections.   He is wearing a mask when he goes out.  The patient does not have symptoms concerning for COVID-19 infection (fever, chills, cough, or new shortness of breath).   Since the last visit, he has had a few injections in the left knee.   He is planning on retiring in the next few months.    Denies : Chest pain. Dizziness. Leg edema. Nitroglycerin use. Orthopnea. Palpitations. Paroxysmal nocturnal dyspnea. Shortness of breath. Syncope.     Past Medical History:  Diagnosis Date  . Basal cell carcinoma    MOHS  . Coronary artery disease 9/11   S/P MI and PTCA--X 4 STENTS  . Diverticulosis   . Hip pain    CHRONIC  . Hyperlipidemia   . LBBB (left bundle branch block)   . Neuropathy    IDIOPATHIC  . Sleep apnea 11/22/2016   does not wear CPAP    Past Surgical History:  Procedure Laterality Date  . CARDIAC CATHETERIZATION  09/11  . CHOLECYSTECTOMY    . JOINT REPLACEMENT  11/22/2016   right total knee 10/2016  . LUMBAR LAMINECTOMY/DECOMPRESSION MICRODISCECTOMY Left 11/26/2016   Procedure: Laminectomy for facet/synovial cyst - left - Lumbar four-Lumbar five;  Surgeon: Earnie Larsson, MD;  Location: [facility];  Service: Neurosurgery;  Laterality: Left;  . RIGHT SHOULDER BONE TUMOR    . SLIPPED CAPITAL FEMORAL EPIPHYSIS PINNING       Current Outpatient  Medications  Medication Sig Dispense Refill  . aspirin EC 81 MG tablet Take 1 tablet (81 mg total) by mouth daily.    . clopidogrel (PLAVIX) 75 MG tablet TAKE ONE TABLET BY MOUTH DAILY 15 tablet 0  . docusate sodium (COLACE) 100 MG capsule Take 100 mg by mouth as needed.    . DULoxetine (CYMBALTA) 60 MG capsule Take 60 mg by mouth daily.    [name] lisinopril (ZESTRIL) 5 MG tablet Take 1 tablet (5 mg total) by mouth daily. 90 tablet 1  . metoprolol succinate (TOPROL-XL) 25 MG 24 hr tablet TAKE 1/2 TABLET BY MOUTH TWO TIMES A DAY *PLEASE MAKE OVERDUE APPOINTMENT WITH DR. Malaak Stach BEFORE ANYMORE REFILLS* 90 tablet 1  . nitroGLYCERIN (NITROSTAT) 0.4 MG SL tablet Dissolve one tablet under tongue every 5 minutes as needed for chest pain; up to 3 doses--call 911 if pain persist 25 tablet 3  . Omega-3 Fatty Acids (FISH OIL) 1000 MG CAPS Take 2 capsules (2,000 mg total) by mouth daily.  0  . oxyCODONE (OXY IR/ROXICODONE) 5 MG immediate release tablet Take 5 mg by mouth as needed.    [name] oxyCODONE-acetaminophen (PERCOCET) 10-325 MG per tablet Take 1 tablet every 6 (six) hours as needed by mouth for pain.     . pregabalin (LYRICA) 75 MG capsule Take  75 mg by mouth daily.    . rosuvastatin (CRESTOR) 40 MG tablet TAKE ONE TABLET BY MOUTH EVERY EVENING 90 tablet 2  . zolpidem (AMBIEN) 5 MG tablet Take 5-10 mg at bedtime by mouth. Depends on insomnia if takes 5-10 mg  3   No current facility-administered medications for this visit.    Allergies:   Mirapex [pramipexole dihydrochloride] and Trazodone and nefazodone    Social History:  The patient  reports that he has never smoked. He has never used smokeless tobacco. He reports that he does not drink alcohol and does not use drugs.   Family History:  The patient's family history includes Cancer in his mother; Lung cancer in his father.    ROS:  Please see the history of present illness.   Otherwise, review of systems are positive for chronic back pain.   All  other systems are reviewed and negative.    PHYSICAL EXAM: VS:  BP 130/70   Pulse 67   Ht 5\' 11"  (1.803 m)   Wt 235 lb 9.6 oz (106.9 kg)   SpO2 93%   BMI 32.86 kg/m  , BMI Body mass index is 32.86 kg/m. GEN: Well nourished, well developed, in no acute distress  HEENT: normal  Neck: no JVD, carotid bruits, or masses Cardiac: RRR; no murmurs, rubs, or gallops,no edema ; parodoxical split S2 Respiratory:  clear to auscultation bilaterally, normal work of breathing GI: soft, nontender, nondistended, + BS MS: no deformity or atrophy  Skin: warm and dry, no rash Neuro:  Strength and sensation are intact Psych: euthymic mood, full affect   EKG:   The ekg ordered today demonstrates *NSR, LBBB   Recent Labs: No results found for requested labs within last 8760 hours.   Lipid Panel    Component Value Date/Time   CHOL 128 07/25/2014 0806   TRIG 94.0 07/25/2014 0806   HDL 38.70 (L) 07/25/2014 0806   CHOLHDL 3 07/25/2014 0806   VLDL 18.8 07/25/2014 0806   LDLCALC 71 07/25/2014 0806   LDLDIRECT 76.8 02/22/2013 0748     Other studies Reviewed: Additional studies/ records that were reviewed today with results demonstrating: labs reviewed.   ASSESSMENT AND PLAN:  1. CAD/ Old MI: No angina. Continue aggressive secondary prevention.  Try to increase exercise.  No sx like his prior MI.  Should have more time once he retires for taking care of himself.  If he has more sx, would have a low threshold for a stress test.  2. Hyperlipidemia: TG elevated.  Try to improve diet and increase exercise.  3. LBBB: Chronic 4. PVCs: No sx of palpitations.  5. Healthy diet, more exercise discussed.     Current medicines are reviewed at length with the patient today.  The patient concerns regarding his medicines were addressed.  The following changes have been made:  No change  Labs/ tests ordered today include:  No orders of the defined types were placed in this encounter.   Recommend 150  minutes/week of aerobic exercise Low fat, low carb, high fiber diet recommended  Disposition:   FU in 1 year   Signed, Larae Grooms, MD  09/02/2019 4:08 PM    [facility] [place], [place], [place]  [address] Phone: [phone]; Fax: [phone]

## 2019-09-02 NOTE — Patient Instructions (Signed)

## 2019-09-06 ENCOUNTER — Telehealth: Payer: Self-pay

## 2019-09-06 NOTE — Telephone Encounter (Signed)
NOTES ON FILE   SENT REFERRAL TO SCHEDULING

## 2019-09-07 ENCOUNTER — Other Ambulatory Visit: Payer: Self-pay | Admitting: Interventional Cardiology

## 2019-09-08 ENCOUNTER — Other Ambulatory Visit: Payer: Self-pay | Admitting: Physician Assistant

## 2019-09-14 DIAGNOSIS — G894 Chronic pain syndrome: Secondary | ICD-10-CM | POA: Diagnosis not present

## 2019-09-14 DIAGNOSIS — G609 Hereditary and idiopathic neuropathy, unspecified: Secondary | ICD-10-CM | POA: Diagnosis not present

## 2019-09-14 DIAGNOSIS — G4700 Insomnia, unspecified: Secondary | ICD-10-CM | POA: Diagnosis not present

## 2019-09-14 DIAGNOSIS — R69 Illness, unspecified: Secondary | ICD-10-CM | POA: Diagnosis not present

## 2019-09-21 ENCOUNTER — Telehealth: Payer: Self-pay | Admitting: Interventional Cardiology

## 2019-09-21 DIAGNOSIS — R0602 Shortness of breath: Secondary | ICD-10-CM

## 2019-09-21 NOTE — Telephone Encounter (Signed)
Pt c/o Shortness Of Breath: STAT if SOB developed within the last 24 hours or pt is noticeably SOB on the phone  1. Are you currently SOB (can you hear that pt is SOB on the phone)? No  2. How long have you been experiencing SOB? Past few weeks, per patient  3. Are you SOB when sitting or when up moving around? When up and moving around   4. Are you currently experiencing any other symptoms? Fatigue

## 2019-09-22 NOTE — Telephone Encounter (Signed)
Left message to call back  

## 2019-09-23 NOTE — Telephone Encounter (Signed)
Patient is returning call.  °

## 2019-09-23 NOTE — Telephone Encounter (Signed)
Left message for patient to call back  

## 2019-09-23 NOTE — Telephone Encounter (Signed)
Would check lexiscan cardiolite.  Not sure he could do treadmill because of joint pain.  JV

## 2019-09-23 NOTE — Telephone Encounter (Signed)
Called and spoke to patient. He states that for the past few weeks he has been "short winded" when playing golf. He denies SOB at rest, chest pain, palpitations, lightheadedness, dizziness, swelling, significant weight gain in the past few weeks, or any other Sx. Denies NTG use. He states that he has gained weight over the years and does not exercise like he should. He states that he eats foods that are high in salt. Discussed gradually increasing exercise and limiting salt in his diet. Patient is wanting to know if he should have a stress test. Made patient aware that I will forward to Dr. Irish Lack to review. Instructed the patient to let us know if his Sx change or worsen.

## 2019-09-24 NOTE — Telephone Encounter (Signed)
Called and made patient aware that Dr. Irish Lack would like to order a lexiscan myoview. Reviewed instructions with the patient. He is aware that he will be contacted by [facility] to schedule.

## 2019-09-28 ENCOUNTER — Telehealth (HOSPITAL_COMMUNITY): Payer: Self-pay | Admitting: *Deleted

## 2019-09-28 NOTE — Telephone Encounter (Signed)
Patient given detailed instructions per Myocardial Perfusion Study Information Sheet for the test on 09/29/19 at 8:00. Patient notified to arrive 15 minutes early and that it is imperative to arrive on time for appointment to keep from having the test rescheduled.  If you need to cancel or reschedule your appointment, please call the office within 24 hours of your appointment. . Patient verbalized understanding.Wayne [name]

## 2019-09-29 ENCOUNTER — Ambulatory Visit (HOSPITAL_COMMUNITY): Payer: Medicare HMO | Attending: Internal Medicine

## 2019-09-29 ENCOUNTER — Other Ambulatory Visit: Payer: Self-pay

## 2019-09-29 DIAGNOSIS — R0602 Shortness of breath: Secondary | ICD-10-CM | POA: Diagnosis not present

## 2019-09-29 LAB — MYOCARDIAL PERFUSION IMAGING
LV dias vol: 164 mL (ref 62–150)
LV sys vol: 72 mL
Peak HR: 72 {beats}/min
Rest HR: 53 {beats}/min
SDS: 3
SRS: 1
SSS: 4
TID: 1

## 2019-09-29 MED ORDER — TECHNETIUM TC 99M TETROFOSMIN IV KIT
32.3000 | PACK | Freq: Once | INTRAVENOUS | Status: AC | PRN
  Administered 2019-09-29: 32.3 via INTRAVENOUS
  Filled 2019-09-29: qty 33

## 2019-09-29 MED ORDER — TECHNETIUM TC 99M TETROFOSMIN IV KIT
10.3000 | PACK | Freq: Once | INTRAVENOUS | Status: AC | PRN
  Administered 2019-09-29: 10.3 via INTRAVENOUS
  Filled 2019-09-29: qty 11

## 2019-09-29 MED ORDER — REGADENOSON 0.4 MG/5ML IV SOLN
0.4000 mg | Freq: Once | INTRAVENOUS | Status: AC
  Administered 2019-09-29: 0.4 mg via INTRAVENOUS

## 2019-10-13 NOTE — Telephone Encounter (Signed)
Called and spoke to patient. He states that he took Tylenol which helped to improve his Sx.

## 2019-10-18 ENCOUNTER — Other Ambulatory Visit: Payer: Self-pay | Admitting: Physical Medicine and Rehabilitation

## 2019-10-18 ENCOUNTER — Ambulatory Visit
Admission: RE | Admit: 2019-10-18 | Discharge: 2019-10-18 | Disposition: A | Discharge status: Home / Self Care | Payer: Medicare HMO | Source: Ambulatory Visit | Attending: Physical Medicine and Rehabilitation | Admitting: Physical Medicine and Rehabilitation

## 2019-10-18 DIAGNOSIS — G894 Chronic pain syndrome: Secondary | ICD-10-CM | POA: Diagnosis not present

## 2019-10-18 DIAGNOSIS — M19071 Primary osteoarthritis, right ankle and foot: Secondary | ICD-10-CM | POA: Diagnosis not present

## 2019-10-18 DIAGNOSIS — M79671 Pain in right foot: Secondary | ICD-10-CM

## 2019-10-18 DIAGNOSIS — G609 Hereditary and idiopathic neuropathy, unspecified: Secondary | ICD-10-CM | POA: Diagnosis not present

## 2019-10-18 DIAGNOSIS — R609 Edema, unspecified: Secondary | ICD-10-CM | POA: Diagnosis not present

## 2019-10-18 DIAGNOSIS — M25571 Pain in right ankle and joints of right foot: Secondary | ICD-10-CM

## 2019-10-18 DIAGNOSIS — G4700 Insomnia, unspecified: Secondary | ICD-10-CM | POA: Diagnosis not present

## 2019-10-18 DIAGNOSIS — M7989 Other specified soft tissue disorders: Secondary | ICD-10-CM | POA: Diagnosis not present

## 2019-10-18 DIAGNOSIS — Z23 Encounter for immunization: Secondary | ICD-10-CM | POA: Diagnosis not present

## 2019-10-18 DIAGNOSIS — R69 Illness, unspecified: Secondary | ICD-10-CM | POA: Diagnosis not present

## 2019-10-25 DIAGNOSIS — Z85828 Personal history of other malignant neoplasm of skin: Secondary | ICD-10-CM | POA: Diagnosis not present

## 2019-10-25 DIAGNOSIS — L821 Other seborrheic keratosis: Secondary | ICD-10-CM | POA: Diagnosis not present

## 2019-10-25 DIAGNOSIS — L218 Other seborrheic dermatitis: Secondary | ICD-10-CM | POA: Diagnosis not present

## 2019-10-25 DIAGNOSIS — D1801 Hemangioma of skin and subcutaneous tissue: Secondary | ICD-10-CM | POA: Diagnosis not present

## 2019-11-01 ENCOUNTER — Other Ambulatory Visit: Payer: Self-pay

## 2019-11-01 DIAGNOSIS — G4733 Obstructive sleep apnea (adult) (pediatric): Secondary | ICD-10-CM

## 2019-11-01 DIAGNOSIS — M25571 Pain in right ankle and joints of right foot: Secondary | ICD-10-CM | POA: Diagnosis not present

## 2019-11-01 DIAGNOSIS — R69 Illness, unspecified: Secondary | ICD-10-CM | POA: Diagnosis not present

## 2019-11-01 NOTE — Progress Notes (Signed)
Referral received from Dr. Wilburn Cornelia for home sleep study to work-up for possible Inspire Implant.

## 2019-11-02 ENCOUNTER — Telehealth: Payer: Self-pay | Admitting: *Deleted

## 2019-11-02 NOTE — Telephone Encounter (Signed)
-----   Message from Antonieta Iba, RN sent at 11/01/2019  5:02 PM EDT ----- Home sleep study ordered per referral from Dr. Wilburn Cornelia. Thanks!

## 2019-11-03 NOTE — Telephone Encounter (Signed)
NO PA REQUIRED

## 2019-11-15 DIAGNOSIS — G4700 Insomnia, unspecified: Secondary | ICD-10-CM | POA: Diagnosis not present

## 2019-11-15 DIAGNOSIS — R69 Illness, unspecified: Secondary | ICD-10-CM | POA: Diagnosis not present

## 2019-11-15 DIAGNOSIS — Z79891 Long term (current) use of opiate analgesic: Secondary | ICD-10-CM | POA: Diagnosis not present

## 2019-11-15 DIAGNOSIS — G609 Hereditary and idiopathic neuropathy, unspecified: Secondary | ICD-10-CM | POA: Diagnosis not present

## 2019-11-15 DIAGNOSIS — G894 Chronic pain syndrome: Secondary | ICD-10-CM | POA: Diagnosis not present

## 2019-12-01 DIAGNOSIS — M1611 Unilateral primary osteoarthritis, right hip: Secondary | ICD-10-CM | POA: Diagnosis not present

## 2019-12-01 DIAGNOSIS — Z96651 Presence of right artificial knee joint: Secondary | ICD-10-CM | POA: Diagnosis not present

## 2019-12-01 DIAGNOSIS — M1711 Unilateral primary osteoarthritis, right knee: Secondary | ICD-10-CM | POA: Diagnosis not present

## 2019-12-01 DIAGNOSIS — M21621 Bunionette of right foot: Secondary | ICD-10-CM | POA: Diagnosis not present

## 2019-12-01 DIAGNOSIS — M1612 Unilateral primary osteoarthritis, left hip: Secondary | ICD-10-CM | POA: Diagnosis not present

## 2019-12-01 DIAGNOSIS — Z96642 Presence of left artificial hip joint: Secondary | ICD-10-CM | POA: Diagnosis not present

## 2019-12-01 DIAGNOSIS — M1712 Unilateral primary osteoarthritis, left knee: Secondary | ICD-10-CM | POA: Diagnosis not present

## 2019-12-01 DIAGNOSIS — M19071 Primary osteoarthritis, right ankle and foot: Secondary | ICD-10-CM | POA: Diagnosis not present

## 2019-12-01 DIAGNOSIS — M25571 Pain in right ankle and joints of right foot: Secondary | ICD-10-CM | POA: Diagnosis not present

## 2019-12-06 ENCOUNTER — Encounter (HOSPITAL_BASED_OUTPATIENT_CLINIC_OR_DEPARTMENT_OTHER): Payer: Medicare HMO | Admitting: Cardiology

## 2019-12-15 DIAGNOSIS — R69 Illness, unspecified: Secondary | ICD-10-CM | POA: Diagnosis not present

## 2019-12-20 DIAGNOSIS — G894 Chronic pain syndrome: Secondary | ICD-10-CM | POA: Diagnosis not present

## 2019-12-20 DIAGNOSIS — G609 Hereditary and idiopathic neuropathy, unspecified: Secondary | ICD-10-CM | POA: Diagnosis not present

## 2019-12-20 DIAGNOSIS — G4700 Insomnia, unspecified: Secondary | ICD-10-CM | POA: Diagnosis not present

## 2019-12-20 DIAGNOSIS — R69 Illness, unspecified: Secondary | ICD-10-CM | POA: Diagnosis not present

## 2019-12-23 ENCOUNTER — Other Ambulatory Visit: Payer: Self-pay

## 2019-12-23 ENCOUNTER — Ambulatory Visit (HOSPITAL_BASED_OUTPATIENT_CLINIC_OR_DEPARTMENT_OTHER): Payer: Medicare HMO | Attending: Cardiology | Admitting: Cardiology

## 2019-12-23 DIAGNOSIS — G4733 Obstructive sleep apnea (adult) (pediatric): Secondary | ICD-10-CM | POA: Diagnosis not present

## 2019-12-26 NOTE — Procedures (Signed)
  Patient Name: Wayne [name], Wayne [name] Date: 12/23/2019 Gender: Male D.O.B: Jul 23, 1950 Age (years): 62 Referring Provider: Fransico Him MD, ABSM Height (inches): 71 Interpreting Physician: Fransico Him MD, ABSM Weight (lbs): 230 RPSGT: Neeriemer, Holly BMI: 32 MRN: [id number] Neck Size: 17.00  CLINICAL INFORMATION Sleep Study Type: HST  Indication for sleep study: OSA  Epworth Sleepiness Score: 6  Most recent titration study dated 05/17/2017 revealed an AHI of 33.2/h.  SLEEP STUDY TECHNIQUE A multi-channel overnight portable sleep study was performed. The channels recorded were: nasal airflow, thoracic respiratory movement, and oxygen saturation with a pulse oximetry. Snoring was also monitored.  MEDICATIONS Patient self administered medications include: ZOLPIDEM TARTRATE.  SLEEP ARCHITECTURE Patient was studied for 369 minutes. The sleep efficiency was 99.9 % and the patient was supine for 19.5%. The arousal index was 0.0 per hour.  RESPIRATORY PARAMETERS The overall AHI was 50.7 per hour, with a central apnea index of 0.0 per hour.  The oxygen nadir was 40% during sleep.  CARDIAC DATA Mean heart rate during sleep was 57.1 bpm.  IMPRESSIONS - Severe obstructive sleep apnea occurred during this study (AHI = 50.7/h). - No significant central sleep apnea occurred during this study (CAI = 0.0/h). - Severe oxygen desaturation was noted during this study (Min O2 = 40%). - Patient snored 9.3% during the sleep.  DIAGNOSIS - Obstructive Sleep Apnea (G47.33) - Nocturnal Hypoxemia (G47.36)  RECOMMENDATIONS - Recommend in lab CPAP titration due to severity of sleep disordered breathing and severe nocturnal hypoxemia.  - Positional therapy avoiding supine position during sleep. - Avoid alcohol, sedatives and other CNS depressants that may worsen sleep apnea and disrupt normal sleep architecture. - Sleep hygiene should be reviewed to assess factors that may improve sleep quality. -  Weight management and regular exercise should be initiated or continued.  [Electronically signed] 12/26/2019 01:54 PM  Fransico Him MD, ABSM Diplomate, American Board of Sleep Medicine

## 2019-12-27 DIAGNOSIS — M654 Radial styloid tenosynovitis [de Quervain]: Secondary | ICD-10-CM | POA: Diagnosis not present

## 2019-12-27 NOTE — Progress Notes (Signed)
Severe OSA.  Needs CPAP titration.

## 2019-12-28 ENCOUNTER — Telehealth: Payer: Self-pay | Admitting: *Deleted

## 2019-12-28 DIAGNOSIS — G4733 Obstructive sleep apnea (adult) (pediatric): Secondary | ICD-10-CM

## 2019-12-28 NOTE — Telephone Encounter (Signed)
-----   Message from Sueanne Margarita, MD sent at 12/26/2019  1:56 PM EST ----- Please let patient know that they have sleep apnea and recommend CPAP titration. Please set up titration in the sleep lab ASAP due to severe hypoxemia

## 2019-12-28 NOTE — Telephone Encounter (Addendum)
Informed patient of sleep study results and patient understanding was verbalized. Patient understands his sleep study showed they have sleep apnea and recommend CPAP titration. Please set up titration in the sleep lab ASAP due to severe hypoxemia.  let patient know that his OSA is very bad and severe hypoxemia and I am not sure that INspire device is going to resolve this.  PER DPR Left detailed message on voicemail and informed patient to call back with questions   Titration sent to sleep pool

## 2019-12-30 DIAGNOSIS — R69 Illness, unspecified: Secondary | ICD-10-CM | POA: Diagnosis not present

## 2020-01-03 DIAGNOSIS — M1732 Unilateral post-traumatic osteoarthritis, left knee: Secondary | ICD-10-CM | POA: Diagnosis not present

## 2020-01-06 NOTE — Telephone Encounter (Signed)
01/06/20 Case # [id number]

## 2020-01-10 DIAGNOSIS — M1732 Unilateral post-traumatic osteoarthritis, left knee: Secondary | ICD-10-CM | POA: Diagnosis not present

## 2020-01-18 DIAGNOSIS — M1732 Unilateral post-traumatic osteoarthritis, left knee: Secondary | ICD-10-CM | POA: Diagnosis not present

## 2020-01-19 NOTE — Telephone Encounter (Signed)
Auth Approved, case # [id number]; Valid through 01/10/20 - 07/08/20

## 2020-01-24 DIAGNOSIS — G894 Chronic pain syndrome: Secondary | ICD-10-CM | POA: Diagnosis not present

## 2020-01-24 DIAGNOSIS — G609 Hereditary and idiopathic neuropathy, unspecified: Secondary | ICD-10-CM | POA: Diagnosis not present

## 2020-01-24 DIAGNOSIS — R69 Illness, unspecified: Secondary | ICD-10-CM | POA: Diagnosis not present

## 2020-01-24 DIAGNOSIS — G4700 Insomnia, unspecified: Secondary | ICD-10-CM | POA: Diagnosis not present

## 2020-02-05 ENCOUNTER — Other Ambulatory Visit: Payer: Self-pay | Admitting: Interventional Cardiology

## 2020-02-24 ENCOUNTER — Other Ambulatory Visit: Payer: Self-pay | Admitting: Interventional Cardiology

## 2020-02-28 ENCOUNTER — Other Ambulatory Visit: Payer: Self-pay | Admitting: Physical Medicine and Rehabilitation

## 2020-02-28 ENCOUNTER — Ambulatory Visit
Admission: RE | Admit: 2020-02-28 | Discharge: 2020-02-28 | Disposition: A | Discharge status: Home / Self Care | Payer: Medicare HMO | Source: Ambulatory Visit | Attending: Physical Medicine and Rehabilitation | Admitting: Physical Medicine and Rehabilitation

## 2020-02-28 DIAGNOSIS — R52 Pain, unspecified: Secondary | ICD-10-CM

## 2020-02-28 DIAGNOSIS — M19031 Primary osteoarthritis, right wrist: Secondary | ICD-10-CM | POA: Diagnosis not present

## 2020-02-28 DIAGNOSIS — R69 Illness, unspecified: Secondary | ICD-10-CM | POA: Diagnosis not present

## 2020-02-28 DIAGNOSIS — Z79891 Long term (current) use of opiate analgesic: Secondary | ICD-10-CM | POA: Diagnosis not present

## 2020-02-28 DIAGNOSIS — G4700 Insomnia, unspecified: Secondary | ICD-10-CM | POA: Diagnosis not present

## 2020-02-28 DIAGNOSIS — G609 Hereditary and idiopathic neuropathy, unspecified: Secondary | ICD-10-CM | POA: Diagnosis not present

## 2020-02-28 DIAGNOSIS — G894 Chronic pain syndrome: Secondary | ICD-10-CM | POA: Diagnosis not present

## 2020-03-06 ENCOUNTER — Other Ambulatory Visit: Payer: Self-pay

## 2020-03-06 ENCOUNTER — Ambulatory Visit (HOSPITAL_BASED_OUTPATIENT_CLINIC_OR_DEPARTMENT_OTHER): Payer: Medicare HMO | Attending: Cardiology | Admitting: Cardiology

## 2020-03-06 DIAGNOSIS — Z79899 Other long term (current) drug therapy: Secondary | ICD-10-CM | POA: Insufficient documentation

## 2020-03-06 DIAGNOSIS — G4733 Obstructive sleep apnea (adult) (pediatric): Secondary | ICD-10-CM

## 2020-03-06 DIAGNOSIS — Z9989 Dependence on other enabling machines and devices: Secondary | ICD-10-CM | POA: Insufficient documentation

## 2020-03-07 NOTE — Procedures (Signed)
   Patient Name: Wayne, [name] Date: 03/06/2020 Gender: Male D.O.B: 18-Nov-1950 Age (years): 15 Referring Provider: Fransico Him MD, ABSM Height (inches): 71 Interpreting Physician: Fransico Him MD, ABSM Weight (lbs): 230 RPSGT: Gwenyth Allegra BMI: 32 MRN: [id number] Neck Size: 17.00  CLINICAL INFORMATION The patient is referred for a CPAP titration to treat sleep apnea.  SLEEP STUDY TECHNIQUE As per the AASM Manual for the Scoring of Sleep and Associated Events v2.3 (April 2016) with a hypopnea requiring 4% desaturations.  The channels recorded and monitored were frontal, central and occipital EEG, electrooculogram (EOG), submentalis EMG (chin), nasal and oral airflow, thoracic and abdominal wall motion, anterior tibialis EMG, snore microphone, electrocardiogram, and pulse oximetry. Continuous positive airway pressure (CPAP) was initiated at the beginning of the study and titrated to treat sleep-disordered breathing.  MEDICATIONS Medications self-administered by patient taken the night of the study : ZOLPIDEM TARTRATE  TECHNICIAN COMMENTS Comments added by technician: None Comments added by scorer:N/A  RESPIRATORY PARAMETERS Optimal PAP Pressure (cm): N/A  AHI at Optimal Pressure (/hr):N/A Overall Minimal O2 (%):83.0  Supine % at Optimal Pressure (%):N/A Minimal O2 at Optimal Pressure (%): N/A   SLEEP ARCHITECTURE The study was initiated at 10:55:09 PM and ended at 5:04:44 AM.  Sleep onset time was 52.8 minutes and the sleep efficiency was 79.2%. The total sleep time was 292.8 minutes.  The patient spent 9.1% of the night in stage N1 sleep, 82.9% in stage N2 sleep, 0.0%% in stage N3 and 8% in REM.Stage REM latency was 242.5 minutes  Wake after sleep onset was 24.0. Alpha intrusion was absent. Supine sleep was 67.89%.  CARDIAC DATA The 2 lead EKG demonstrated sinus rhythm. The mean heart rate was 52.4 beats per minute. Other EKG findings include: PACs.  LEG MOVEMENT  DATA The total Periodic Limb Movements of Sleep (PLMS) were 0. The PLMS index was 0.0. A PLMS index of <15 is considered normal in adults.  IMPRESSIONS - The optimal PAP pressure could not be obtained due to ongoing respiratory events and ongoing hypoxemia.  - Central sleep apnea was not noted during this titration (CAI = 1.8/h). - Moderate oxygen desaturations were observed during this titration (min O2 = 83.0%). - The patient snored with loud snoring volume during this titration study. - PACs were observed during this study. - Clinically significant periodic limb movements were not noted during this study. Arousals associated with PLMs were rare.  DIAGNOSIS - Obstructive Sleep Apnea (G47.33) - Inadequate CPAP titration  RECOMMENDATIONS - Recommend in lab BiPAP titration.  - Avoid alcohol, sedatives and other CNS depressants that may worsen sleep apnea and disrupt normal sleep architecture. - Sleep hygiene should be reviewed to assess factors that may improve sleep quality. - Weight management and regular exercise should be initiated or continued.  [Electronically signed] 03/07/2020 10:27 AM  Fransico Him MD, ABSM Diplomate, American Board of Sleep Medicine

## 2020-03-14 ENCOUNTER — Telehealth: Payer: Self-pay | Admitting: *Deleted

## 2020-03-14 DIAGNOSIS — G4733 Obstructive sleep apnea (adult) (pediatric): Secondary | ICD-10-CM

## 2020-03-14 NOTE — Telephone Encounter (Signed)
Informed patient of sleep study results and patient understanding was verbalized. Patient understands his sleep study showed Unsuccessful CPAP titration - order in lab BiPAP titration.  Left detailed message on voicemail and informed patient to call back with questions.  bipap titration sent to sleep pool

## 2020-03-14 NOTE — Telephone Encounter (Signed)
Staff message sent to Gae Bon ok to schedule BIPAP titration. Per [employer] no PA is required. Call reference # [id number].

## 2020-03-14 NOTE — Telephone Encounter (Signed)
-----   Message from Sueanne Margarita, MD sent at 03/07/2020 10:28 AM EST ----- Unsuccessful CPAP titration  - order in lab BiPAP titration

## 2020-03-15 NOTE — Telephone Encounter (Signed)
Patient is returning call regarding his sleep study results and BIPAP titration.

## 2020-03-16 NOTE — Telephone Encounter (Signed)
Returned call to patient. Pt is agreeable to treatment.

## 2020-03-22 NOTE — Addendum Note (Signed)
Addended by: Freada Bergeron on: 03/22/2020 12:50 PM   Modules accepted: Orders

## 2020-03-24 ENCOUNTER — Telehealth: Payer: Self-pay | Admitting: *Deleted

## 2020-03-24 NOTE — Telephone Encounter (Signed)
Patient is scheduled for biPAP Titration on 05-11-20. Patient understands his titration study will be done at [facility] sleep lab. Patient understands he will receive a letter in a week or so detailing appointment, date, time, and location. Patient understands to call if he does not receive the letter  in a timely manner. Patient agrees with treatment and thanked me for call.

## 2020-03-24 NOTE — Addendum Note (Signed)
Addended by: Freada Bergeron on: 03/24/2020 03:37 PM   Modules accepted: Orders

## 2020-03-24 NOTE — Telephone Encounter (Signed)
Patient called in needing a new mask and a new hose. Per dr Radford Pax it is ok to order his supplies. Order placed to [employer]. Patient will have a follow up appointment with dr Radford Pax after his Bipap titration.

## 2020-04-03 DIAGNOSIS — G894 Chronic pain syndrome: Secondary | ICD-10-CM | POA: Diagnosis not present

## 2020-04-03 DIAGNOSIS — G4700 Insomnia, unspecified: Secondary | ICD-10-CM | POA: Diagnosis not present

## 2020-04-03 DIAGNOSIS — G609 Hereditary and idiopathic neuropathy, unspecified: Secondary | ICD-10-CM | POA: Diagnosis not present

## 2020-04-03 DIAGNOSIS — R69 Illness, unspecified: Secondary | ICD-10-CM | POA: Diagnosis not present

## 2020-05-08 DIAGNOSIS — G609 Hereditary and idiopathic neuropathy, unspecified: Secondary | ICD-10-CM | POA: Diagnosis not present

## 2020-05-08 DIAGNOSIS — G894 Chronic pain syndrome: Secondary | ICD-10-CM | POA: Diagnosis not present

## 2020-05-08 DIAGNOSIS — R69 Illness, unspecified: Secondary | ICD-10-CM | POA: Diagnosis not present

## 2020-05-08 DIAGNOSIS — G4700 Insomnia, unspecified: Secondary | ICD-10-CM | POA: Diagnosis not present

## 2020-05-08 DIAGNOSIS — F329 Major depressive disorder, single episode, unspecified: Secondary | ICD-10-CM | POA: Diagnosis not present

## 2020-05-08 DIAGNOSIS — M654 Radial styloid tenosynovitis [de Quervain]: Secondary | ICD-10-CM | POA: Diagnosis not present

## 2020-05-11 ENCOUNTER — Encounter (HOSPITAL_BASED_OUTPATIENT_CLINIC_OR_DEPARTMENT_OTHER): Payer: Medicare HMO | Admitting: Cardiology

## 2020-05-23 DIAGNOSIS — G4731 Primary central sleep apnea: Secondary | ICD-10-CM | POA: Diagnosis not present

## 2020-05-29 ENCOUNTER — Ambulatory Visit (HOSPITAL_BASED_OUTPATIENT_CLINIC_OR_DEPARTMENT_OTHER): Payer: Medicare HMO | Attending: Cardiology | Admitting: Cardiology

## 2020-05-29 ENCOUNTER — Other Ambulatory Visit: Payer: Self-pay

## 2020-05-29 DIAGNOSIS — G609 Hereditary and idiopathic neuropathy, unspecified: Secondary | ICD-10-CM | POA: Diagnosis not present

## 2020-05-29 DIAGNOSIS — G4733 Obstructive sleep apnea (adult) (pediatric): Secondary | ICD-10-CM | POA: Insufficient documentation

## 2020-05-29 DIAGNOSIS — R69 Illness, unspecified: Secondary | ICD-10-CM | POA: Diagnosis not present

## 2020-05-29 DIAGNOSIS — F329 Major depressive disorder, single episode, unspecified: Secondary | ICD-10-CM | POA: Diagnosis not present

## 2020-05-29 DIAGNOSIS — G4700 Insomnia, unspecified: Secondary | ICD-10-CM | POA: Diagnosis not present

## 2020-05-29 DIAGNOSIS — G894 Chronic pain syndrome: Secondary | ICD-10-CM | POA: Diagnosis not present

## 2020-06-08 DIAGNOSIS — I208 Other forms of angina pectoris: Secondary | ICD-10-CM | POA: Diagnosis not present

## 2020-06-08 DIAGNOSIS — I25119 Atherosclerotic heart disease of native coronary artery with unspecified angina pectoris: Secondary | ICD-10-CM | POA: Diagnosis not present

## 2020-06-08 DIAGNOSIS — I2510 Atherosclerotic heart disease of native coronary artery without angina pectoris: Secondary | ICD-10-CM | POA: Diagnosis not present

## 2020-06-08 DIAGNOSIS — E782 Mixed hyperlipidemia: Secondary | ICD-10-CM | POA: Diagnosis not present

## 2020-06-13 ENCOUNTER — Other Ambulatory Visit: Payer: Self-pay

## 2020-06-13 MED ORDER — ROSUVASTATIN CALCIUM 40 MG PO TABS: 40.0000 mg | ORAL_TABLET | Freq: Every evening | ORAL | 0 refills | Status: DC

## 2020-06-14 ENCOUNTER — Other Ambulatory Visit: Payer: Self-pay | Admitting: Interventional Cardiology

## 2020-06-14 NOTE — Procedures (Signed)
   Patient Name: Wayne [name], [name] Date: 05/29/2020 Gender: Male D.O.B: 11/21/1950 Age (years): 68 Referring Provider: Fransico Him MD, ABSM Height (inches): 71 Interpreting Physician: Fransico Him MD, ABSM Weight (lbs): 230 RPSGT: Gwenyth Allegra BMI: 32 MRN: [id number] Neck Size: 17.00  CLINICAL INFORMATION The patient is referred for a BiPAP titration to treat sleep apnea.  SLEEP STUDY TECHNIQUE As per the AASM Manual for the Scoring of Sleep and Associated Events v2.3 (April 2016) with a hypopnea requiring 4% desaturations.  The channels recorded and monitored were frontal, central and occipital EEG, electrooculogram (EOG), submentalis EMG (chin), nasal and oral airflow, thoracic and abdominal wall motion, anterior tibialis EMG, snore microphone, electrocardiogram, and pulse oximetry. Bilevel positive airway pressure (BPAP) was initiated at the beginning of the study and titrated to treat sleep-disordered breathing.  MEDICATIONS Medications self-administered by patient taken the night of the study : ZOLPIDEM TARTRATE  RESPIRATORY PARAMETERS Optimal IPAP Pressure (cm): 20  AHI at Optimal Pressure (/hr) 2.0 Optimal EPAP Pressure (cm):16  Overall Minimal O2 (%):79.0  Minimal O2 at Optimal Pressure (%): 89.0  SLEEP ARCHITECTURE Start Time:9:56:30 PM  Stop Time:4:50:05 AM  Total Time (min):413.6  Total Sleep Time (min):298.7 Sleep Latency (min):69.4  Sleep Efficiency (%):72.2%  REM Latency (min):230.5  WASO (min): 45.5 Stage N1 (%): 6.5%  Stage N2 (%): 79.9%  Stage N3 (%): 0.0%  Stage R (%): 13.6 Supine (%):61.28  Arousal Index (/hr):4.4   CARDIAC DATA The 2 lead EKG demonstrated sinus rhythm. The mean heart rate was 54.1 beats per minute. Other EKG findings include: None.  LEG MOVEMENT DATA The total Periodic Limb Movements of Sleep (PLMS) were 0. The PLMS index was 0.0. A PLMS index of <15 is considered normal in adults.  IMPRESSIONS - An optimal PAP pressure  was selected for this patient ( 20/16 cm of water) - Central sleep apnea was not noted during this titration (CAI = 0.2/h). - Severe oxygen desaturations were observed during this titration (min O2 = 79.0%). - The patient snored with moderate snoring volume. - No cardiac abnormalities were observed during this study. - Clinically significant periodic limb movements were not noted during this study. Arousals associated with PLMs were rare.  DIAGNOSIS - Obstructive Sleep Apnea (G47.33)  RECOMMENDATIONS - Trial of BiPAP therapy on 20/16 cm H2O with a Medium size Fisher[employer]Paykel Full Face Mask Simplus mask and heated humidification. - Avoid alcohol, sedatives and other CNS depressants that may worsen sleep apnea and disrupt normal sleep architecture. - Sleep hygiene should be reviewed to assess factors that may improve sleep quality. - Weight management and regular exercise should be initiated or continued. - Return to Sleep Center for re-evaluation after 6 weeks of therapy  [Electronically signed] 06/14/2020 08:56 PM  Fransico Him MD, ABSM Diplomate, American Board of Sleep Medicine

## 2020-06-20 ENCOUNTER — Telehealth: Payer: Self-pay | Admitting: *Deleted

## 2020-06-20 DIAGNOSIS — G4733 Obstructive sleep apnea (adult) (pediatric): Secondary | ICD-10-CM

## 2020-06-20 NOTE — Telephone Encounter (Signed)
The patient has been notified of the result and verbalized understanding.  All questions (if any) were answered. Wayne [name], [name] 06/20/2020 5:46 PM   Upon patient request DME selection is [facility] Patient understands he will be contacted by [facility] to set up his cpap. Patient understands to call if [facility] does not contact him with new setup in a timely manner. Patient understands they will be called once confirmation has been received from [facility] that they have received their new machine to schedule 10 week follow up appointment.   [place] notified of new cpap order  Please add to airview Patient was grateful for the call and thanked me

## 2020-06-20 NOTE — Telephone Encounter (Signed)
-----   Message from Sueanne Margarita, MD sent at 06/14/2020  9:00 PM EDT ----- Please let patient know that they had a successful PAP titration and let DME know that orders are in EPIC.  Please set up 6 week OV with me. Get overnight pulse ox on BiPAP

## 2020-06-30 DIAGNOSIS — Z0100 Encounter for examination of eyes and vision without abnormal findings: Secondary | ICD-10-CM | POA: Diagnosis not present

## 2020-06-30 DIAGNOSIS — H35033 Hypertensive retinopathy, bilateral: Secondary | ICD-10-CM | POA: Diagnosis not present

## 2020-06-30 DIAGNOSIS — I10 Essential (primary) hypertension: Secondary | ICD-10-CM | POA: Diagnosis not present

## 2020-06-30 DIAGNOSIS — H524 Presbyopia: Secondary | ICD-10-CM | POA: Diagnosis not present

## 2020-07-03 DIAGNOSIS — G894 Chronic pain syndrome: Secondary | ICD-10-CM | POA: Diagnosis not present

## 2020-07-03 DIAGNOSIS — R69 Illness, unspecified: Secondary | ICD-10-CM | POA: Diagnosis not present

## 2020-07-03 DIAGNOSIS — F329 Major depressive disorder, single episode, unspecified: Secondary | ICD-10-CM | POA: Diagnosis not present

## 2020-07-03 DIAGNOSIS — G609 Hereditary and idiopathic neuropathy, unspecified: Secondary | ICD-10-CM | POA: Diagnosis not present

## 2020-07-03 DIAGNOSIS — G4700 Insomnia, unspecified: Secondary | ICD-10-CM | POA: Diagnosis not present

## 2020-07-24 DIAGNOSIS — I25119 Atherosclerotic heart disease of native coronary artery with unspecified angina pectoris: Secondary | ICD-10-CM | POA: Diagnosis not present

## 2020-07-24 DIAGNOSIS — I208 Other forms of angina pectoris: Secondary | ICD-10-CM | POA: Diagnosis not present

## 2020-07-24 DIAGNOSIS — E782 Mixed hyperlipidemia: Secondary | ICD-10-CM | POA: Diagnosis not present

## 2020-07-24 DIAGNOSIS — I2510 Atherosclerotic heart disease of native coronary artery without angina pectoris: Secondary | ICD-10-CM | POA: Diagnosis not present

## 2020-08-07 DIAGNOSIS — R69 Illness, unspecified: Secondary | ICD-10-CM | POA: Diagnosis not present

## 2020-08-07 DIAGNOSIS — G894 Chronic pain syndrome: Secondary | ICD-10-CM | POA: Diagnosis not present

## 2020-08-07 DIAGNOSIS — G609 Hereditary and idiopathic neuropathy, unspecified: Secondary | ICD-10-CM | POA: Diagnosis not present

## 2020-08-07 DIAGNOSIS — G4700 Insomnia, unspecified: Secondary | ICD-10-CM | POA: Diagnosis not present

## 2020-08-07 DIAGNOSIS — F329 Major depressive disorder, single episode, unspecified: Secondary | ICD-10-CM | POA: Diagnosis not present

## 2020-08-09 DIAGNOSIS — M654 Radial styloid tenosynovitis [de Quervain]: Secondary | ICD-10-CM | POA: Diagnosis not present

## 2020-08-15 DIAGNOSIS — G4733 Obstructive sleep apnea (adult) (pediatric): Secondary | ICD-10-CM | POA: Diagnosis not present

## 2020-08-15 DIAGNOSIS — R7309 Other abnormal glucose: Secondary | ICD-10-CM | POA: Diagnosis not present

## 2020-08-15 DIAGNOSIS — Z0000 Encounter for general adult medical examination without abnormal findings: Secondary | ICD-10-CM | POA: Diagnosis not present

## 2020-08-15 DIAGNOSIS — Z79899 Other long term (current) drug therapy: Secondary | ICD-10-CM | POA: Diagnosis not present

## 2020-08-15 DIAGNOSIS — M25559 Pain in unspecified hip: Secondary | ICD-10-CM | POA: Diagnosis not present

## 2020-08-15 DIAGNOSIS — Z1331 Encounter for screening for depression: Secondary | ICD-10-CM | POA: Diagnosis not present

## 2020-08-15 DIAGNOSIS — I25119 Atherosclerotic heart disease of native coronary artery with unspecified angina pectoris: Secondary | ICD-10-CM | POA: Diagnosis not present

## 2020-08-15 DIAGNOSIS — E782 Mixed hyperlipidemia: Secondary | ICD-10-CM | POA: Diagnosis not present

## 2020-08-15 DIAGNOSIS — G629 Polyneuropathy, unspecified: Secondary | ICD-10-CM | POA: Diagnosis not present

## 2020-08-15 DIAGNOSIS — I208 Other forms of angina pectoris: Secondary | ICD-10-CM | POA: Diagnosis not present

## 2020-08-15 DIAGNOSIS — Z1389 Encounter for screening for other disorder: Secondary | ICD-10-CM | POA: Diagnosis not present

## 2020-08-28 DIAGNOSIS — G4733 Obstructive sleep apnea (adult) (pediatric): Secondary | ICD-10-CM | POA: Diagnosis not present

## 2020-08-28 DIAGNOSIS — G4731 Primary central sleep apnea: Secondary | ICD-10-CM | POA: Diagnosis not present

## 2020-08-31 ENCOUNTER — Other Ambulatory Visit: Payer: Self-pay | Admitting: Interventional Cardiology

## 2020-09-01 DIAGNOSIS — G4733 Obstructive sleep apnea (adult) (pediatric): Secondary | ICD-10-CM

## 2020-09-09 ENCOUNTER — Other Ambulatory Visit: Payer: Self-pay | Admitting: Interventional Cardiology

## 2020-09-19 DIAGNOSIS — R69 Illness, unspecified: Secondary | ICD-10-CM | POA: Diagnosis not present

## 2020-09-19 DIAGNOSIS — Z79891 Long term (current) use of opiate analgesic: Secondary | ICD-10-CM | POA: Diagnosis not present

## 2020-09-19 DIAGNOSIS — G4700 Insomnia, unspecified: Secondary | ICD-10-CM | POA: Diagnosis not present

## 2020-09-19 DIAGNOSIS — F329 Major depressive disorder, single episode, unspecified: Secondary | ICD-10-CM | POA: Diagnosis not present

## 2020-09-19 DIAGNOSIS — G609 Hereditary and idiopathic neuropathy, unspecified: Secondary | ICD-10-CM | POA: Diagnosis not present

## 2020-09-19 DIAGNOSIS — G894 Chronic pain syndrome: Secondary | ICD-10-CM | POA: Diagnosis not present

## 2020-09-27 DIAGNOSIS — M1712 Unilateral primary osteoarthritis, left knee: Secondary | ICD-10-CM | POA: Diagnosis not present

## 2020-09-28 DIAGNOSIS — G4731 Primary central sleep apnea: Secondary | ICD-10-CM | POA: Diagnosis not present

## 2020-09-28 DIAGNOSIS — G4733 Obstructive sleep apnea (adult) (pediatric): Secondary | ICD-10-CM | POA: Diagnosis not present

## 2020-10-04 DIAGNOSIS — M1712 Unilateral primary osteoarthritis, left knee: Secondary | ICD-10-CM | POA: Diagnosis not present

## 2020-10-11 DIAGNOSIS — M1712 Unilateral primary osteoarthritis, left knee: Secondary | ICD-10-CM | POA: Diagnosis not present

## 2020-10-23 DIAGNOSIS — G894 Chronic pain syndrome: Secondary | ICD-10-CM | POA: Diagnosis not present

## 2020-10-23 DIAGNOSIS — G609 Hereditary and idiopathic neuropathy, unspecified: Secondary | ICD-10-CM | POA: Diagnosis not present

## 2020-10-23 DIAGNOSIS — R69 Illness, unspecified: Secondary | ICD-10-CM | POA: Diagnosis not present

## 2020-10-23 DIAGNOSIS — G4700 Insomnia, unspecified: Secondary | ICD-10-CM | POA: Diagnosis not present

## 2020-10-25 DIAGNOSIS — G629 Polyneuropathy, unspecified: Secondary | ICD-10-CM | POA: Diagnosis not present

## 2020-10-25 DIAGNOSIS — E785 Hyperlipidemia, unspecified: Secondary | ICD-10-CM | POA: Diagnosis not present

## 2020-10-25 DIAGNOSIS — F325 Major depressive disorder, single episode, in full remission: Secondary | ICD-10-CM | POA: Diagnosis not present

## 2020-10-25 DIAGNOSIS — G4733 Obstructive sleep apnea (adult) (pediatric): Secondary | ICD-10-CM | POA: Diagnosis not present

## 2020-10-25 DIAGNOSIS — K5900 Constipation, unspecified: Secondary | ICD-10-CM | POA: Diagnosis not present

## 2020-10-25 DIAGNOSIS — I252 Old myocardial infarction: Secondary | ICD-10-CM | POA: Diagnosis not present

## 2020-10-25 DIAGNOSIS — F419 Anxiety disorder, unspecified: Secondary | ICD-10-CM | POA: Diagnosis not present

## 2020-10-25 DIAGNOSIS — R69 Illness, unspecified: Secondary | ICD-10-CM | POA: Diagnosis not present

## 2020-10-25 DIAGNOSIS — G4700 Insomnia, unspecified: Secondary | ICD-10-CM | POA: Diagnosis not present

## 2020-10-25 DIAGNOSIS — I2510 Atherosclerotic heart disease of native coronary artery without angina pectoris: Secondary | ICD-10-CM | POA: Diagnosis not present

## 2020-10-25 DIAGNOSIS — G8929 Other chronic pain: Secondary | ICD-10-CM | POA: Diagnosis not present

## 2020-10-25 DIAGNOSIS — I10 Essential (primary) hypertension: Secondary | ICD-10-CM | POA: Diagnosis not present

## 2020-10-25 DIAGNOSIS — Z008 Encounter for other general examination: Secondary | ICD-10-CM | POA: Diagnosis not present

## 2020-10-25 DIAGNOSIS — E669 Obesity, unspecified: Secondary | ICD-10-CM | POA: Diagnosis not present

## 2020-10-28 DIAGNOSIS — G4733 Obstructive sleep apnea (adult) (pediatric): Secondary | ICD-10-CM | POA: Diagnosis not present

## 2020-10-28 DIAGNOSIS — G4731 Primary central sleep apnea: Secondary | ICD-10-CM | POA: Diagnosis not present

## 2020-10-31 DIAGNOSIS — M1811 Unilateral primary osteoarthritis of first carpometacarpal joint, right hand: Secondary | ICD-10-CM | POA: Diagnosis not present

## 2020-11-05 NOTE — Progress Notes (Signed)
Virtual Visit via Video Note   This visit type was conducted due to national recommendations for restrictions regarding the COVID-19 Pandemic (e.g. social distancing) in an effort to limit this patient's exposure and mitigate transmission in our community.  Due to his co-morbid illnesses, this patient is at least at moderate risk for complications without adequate follow up.  This format is felt to be most appropriate for this patient at this time.  All issues noted in this document were discussed and addressed.  A limited physical exam was performed with this format.  Please refer to the patient's chart for his consent to telehealth for [facility].       Date:  11/06/2020   ID:  Wayne [name], DOB 07/06/1950, MRN [id number] The patient was identified using 2 identifiers.  Patient Location: Home Provider Location: Home Office   PCP:  Gaynelle Arabian, MD   [place] [facility] Cardiologist:  Larae Grooms, MD     Evaluation Performed:  Follow-Up Visit  Chief Complaint:  OSA  History of Present Illness:    Wayne [name] is a 70 y.o. male with a hx of ASCAD s/p PCI in 2011, HLD, LBBB and hx of OSA but was not wearing his CPAP.  He had been followed by Dr. Radene Gunning years ago but did not tolerate the CPAP and stopped using it  He was referred for sleep study by Dr. Irish Lack.  He underwent a home sleep study in 12/2019 showing severe OSA with an AHI of 50.7/hr and nocturnal hypoxemia with O2 sats as low as 40%. He underwent unsuccessful CPAP titration due to ongoing respiratory events and was placed on BiPAP at 20/16cm H2O.    He is struggling with his BiPAP device and hates the mask.  He feels the pressure is adequate.  Since going on BiPAP he feels rested in the am and has no significant daytime sleepiness but struggles with the PAP.  Occasionally he will need to nap during the day.  It wakes him up at night as well.  He is interested in getting evaluated for the [facility]  device.  The patient does not have symptoms concerning for COVID-19 infection (fever, chills, cough, or new shortness of breath).   Past Medical History:  Diagnosis Date   Basal cell carcinoma    MOHS   Coronary artery disease 9/11   S/P MI and PTCA--X 4 STENTS   Diverticulosis    Hip pain    CHRONIC   Hyperlipidemia    LBBB (left bundle branch block)    Neuropathy    IDIOPATHIC   Sleep apnea 11/22/2016   does not wear CPAP   Past Surgical History:  Procedure Laterality Date   CARDIAC CATHETERIZATION  09/11   CHOLECYSTECTOMY     JOINT REPLACEMENT  11/22/2016   right total knee 10/2016   LUMBAR LAMINECTOMY/DECOMPRESSION MICRODISCECTOMY Left 11/26/2016   Procedure: Laminectomy for facet/synovial cyst - left - Lumbar four-Lumbar five;  Surgeon: Earnie Larsson, MD;  Location: [place];  Service: Neurosurgery;  Laterality: Left;   RIGHT SHOULDER BONE TUMOR     SLIPPED CAPITAL FEMORAL EPIPHYSIS PINNING       Current Meds  Medication Sig   aspirin EC 81 MG tablet Take 1 tablet (81 mg total) by mouth daily.   clopidogrel (PLAVIX) 75 MG tablet TAKE ONE TABLET BY MOUTH DAILY   docusate sodium (COLACE) 100 MG capsule Take 100 mg by mouth as needed.   DULoxetine (CYMBALTA) 60 MG capsule Take  60 mg by mouth daily.   lisinopril (ZESTRIL) 5 MG tablet Take 1 tablet (5 mg total) by mouth daily. Please make yearly appt with Dr. Irish Lack for September 2022 for future refills. Thank you 1st attempt   metoprolol succinate (TOPROL-XL) 25 MG 24 hr tablet Take 0.5 tablets (12.5 mg total) by mouth 2 (two) times daily. TAKE 1/2 TABLET BY MOUTH TWO TIMES A DAY   nitroGLYCERIN (NITROSTAT) 0.4 MG SL tablet Dissolve one tablet under tongue every 5 minutes as needed for chest pain; up to 3 doses--call 911 if pain persist   Omega-3 Fatty Acids (FISH OIL) 1000 MG CAPS Take 2 capsules (2,000 mg total) by mouth daily.   oxyCODONE (OXY IR/ROXICODONE) 5 MG immediate release tablet Take 5 mg by mouth as needed.    oxyCODONE-acetaminophen (PERCOCET) 10-325 MG per tablet Take 1 tablet every 6 (six) hours as needed by mouth for pain.    pregabalin (LYRICA) 75 MG capsule Take 75 mg by mouth daily.   rosuvastatin (CRESTOR) 40 MG tablet TAKE ONE TABLET BY MOUTH EVERY EVENING *NEED APPOINTMENT FOR REFILLS*   zolpidem (AMBIEN) 5 MG tablet Take 5-10 mg at bedtime by mouth. Depends on insomnia if takes 5-10 mg     Allergies:   Mirapex [pramipexole dihydrochloride] and Trazodone and nefazodone   Social History   Tobacco Use   Smoking status: Never   Smokeless tobacco: Never  Vaping Use   Vaping Use: Never used  Substance Use Topics   Alcohol use: No   Drug use: No     Family Hx: The patient's family history includes Cancer in his mother; Lung cancer in his father.  ROS:   Please see the history of present illness.     All other systems reviewed and are negative.   Prior CV studies:   The following studies were reviewed today:  HST, CPAP and BIPAP titrations, PAP download  Labs/Other Tests and Data Reviewed:    EKG:  No ECG reviewed.  Recent Labs: No results found for requested labs within last 8760 hours.   Recent Lipid Panel Lab Results  Component Value Date/Time   CHOL 128 07/25/2014 08:06 AM   TRIG 94.0 07/25/2014 08:06 AM   HDL 38.70 (L) 07/25/2014 08:06 AM   CHOLHDL 3 07/25/2014 08:06 AM   LDLCALC 71 07/25/2014 08:06 AM   LDLDIRECT 76.8 02/22/2013 07:48 AM    Wt Readings from Last 3 Encounters:  11/06/20 230 lb (104.3 kg)  05/29/20 230 lb (104.3 kg)  03/06/20 230 lb (104.3 kg)        Objective:    Vital Signs:  BP 125/86   Pulse 63   Ht 5\' 10"  (1.778 m)   Wt 230 lb (104.3 kg)   BMI 33.00 kg/m    VITAL SIGNS:  reviewed GEN:  no acute distress EYES:  sclerae anicteric, EOMI - Extraocular Movements Intact RESPIRATORY:  normal respiratory effort, symmetric expansion CARDIOVASCULAR:  no peripheral edema SKIN:  no rash, lesions or ulcers. MUSCULOSKELETAL:  no  obvious deformities. NEURO:  alert and oriented x 3, no obvious focal deficit PSYCH:  normal affect  ASSESSMENT & PLAN:    OSA - The patient is tolerating PAP therapy but is struggling with tolerating the FFM.  The patient has been using and benefiting from PAP use and will continue to benefit from therapy but is struggling with tolerating the full face mask.  -I will get a download from his DME -he is really struggling with his BiPAP  and does not tolerate the mask -he really needs a FFM given how high is pressure is set and I do not think that a nasal mask will work given the severity of his OSA and need for high pressures -we discussed the Inspire device and the benefits of this -I think he would be a good candidate for Inspire device and will refer him to [facility] for evaluation -I have asked him to continue using the BiPAP in the interim     COVID-19 Education: The signs and symptoms of COVID-19 were discussed with the patient and how to seek care for testing (follow up with PCP or arrange E-visit).  The importance of social distancing was discussed today.  Time:   Today, I have spent 20 minutes with the patient with telehealth technology discussing the above problems.     Medication Adjustments/Labs and Tests Ordered: Current medicines are reviewed at length with the patient today.  Concerns regarding medicines are outlined above.   Tests Ordered: No orders of the defined types were placed in this encounter.   Medication Changes: No orders of the defined types were placed in this encounter.   Follow Up:  In Person in 1 year(s)  Signed, Fransico Him, MD  11/06/2020 8:46 AM    [place]

## 2020-11-06 ENCOUNTER — Other Ambulatory Visit: Payer: Self-pay

## 2020-11-06 ENCOUNTER — Telehealth (INDEPENDENT_AMBULATORY_CARE_PROVIDER_SITE_OTHER): Payer: Medicare HMO | Admitting: Cardiology

## 2020-11-06 ENCOUNTER — Encounter: Payer: Self-pay | Admitting: Cardiology

## 2020-11-06 VITALS — BP 125/86 | HR 63 | Ht 70.0 in | Wt 230.0 lb

## 2020-11-06 DIAGNOSIS — G4733 Obstructive sleep apnea (adult) (pediatric): Secondary | ICD-10-CM

## 2020-11-06 NOTE — Patient Instructions (Signed)
Medication Instructions:  Your physician recommends that you continue on your current medications as directed. Please refer to the Current Medication list given to you today.  *If you need a refill on your cardiac medications before your next appointment, please call your pharmacy*   Follow-Up: At [facility], you and your health needs are our priority.  As part of our continuing mission to provide you with exceptional heart care, we have created designated Provider Care Teams.  These Care Teams include your primary Cardiologist (physician) and Advanced Practice Providers (APPs -  Physician Assistants and Nurse Practitioners) who all work together to provide you with the care you need, when you need it.   Other Instructions You have been referred to an Ear, Nose, and Throat specialist for a consultation for the [facility] device.

## 2020-11-06 NOTE — Addendum Note (Signed)
Addended by: Antonieta Iba on: 11/06/2020 09:04 AM   Modules accepted: Orders

## 2020-11-17 DIAGNOSIS — G4733 Obstructive sleep apnea (adult) (pediatric): Secondary | ICD-10-CM | POA: Diagnosis not present

## 2020-11-19 IMAGING — DX DG ANKLE COMPLETE 3+V*R*
3 series · 3 of 3 positions shown · non-contrast
Comparison: None.

CLINICAL DATA: Ankle pain and swelling

EXAM:
RIGHT ANKLE - COMPLETE 3+ VIEW

[dg ankle complete right (1 of 3)]
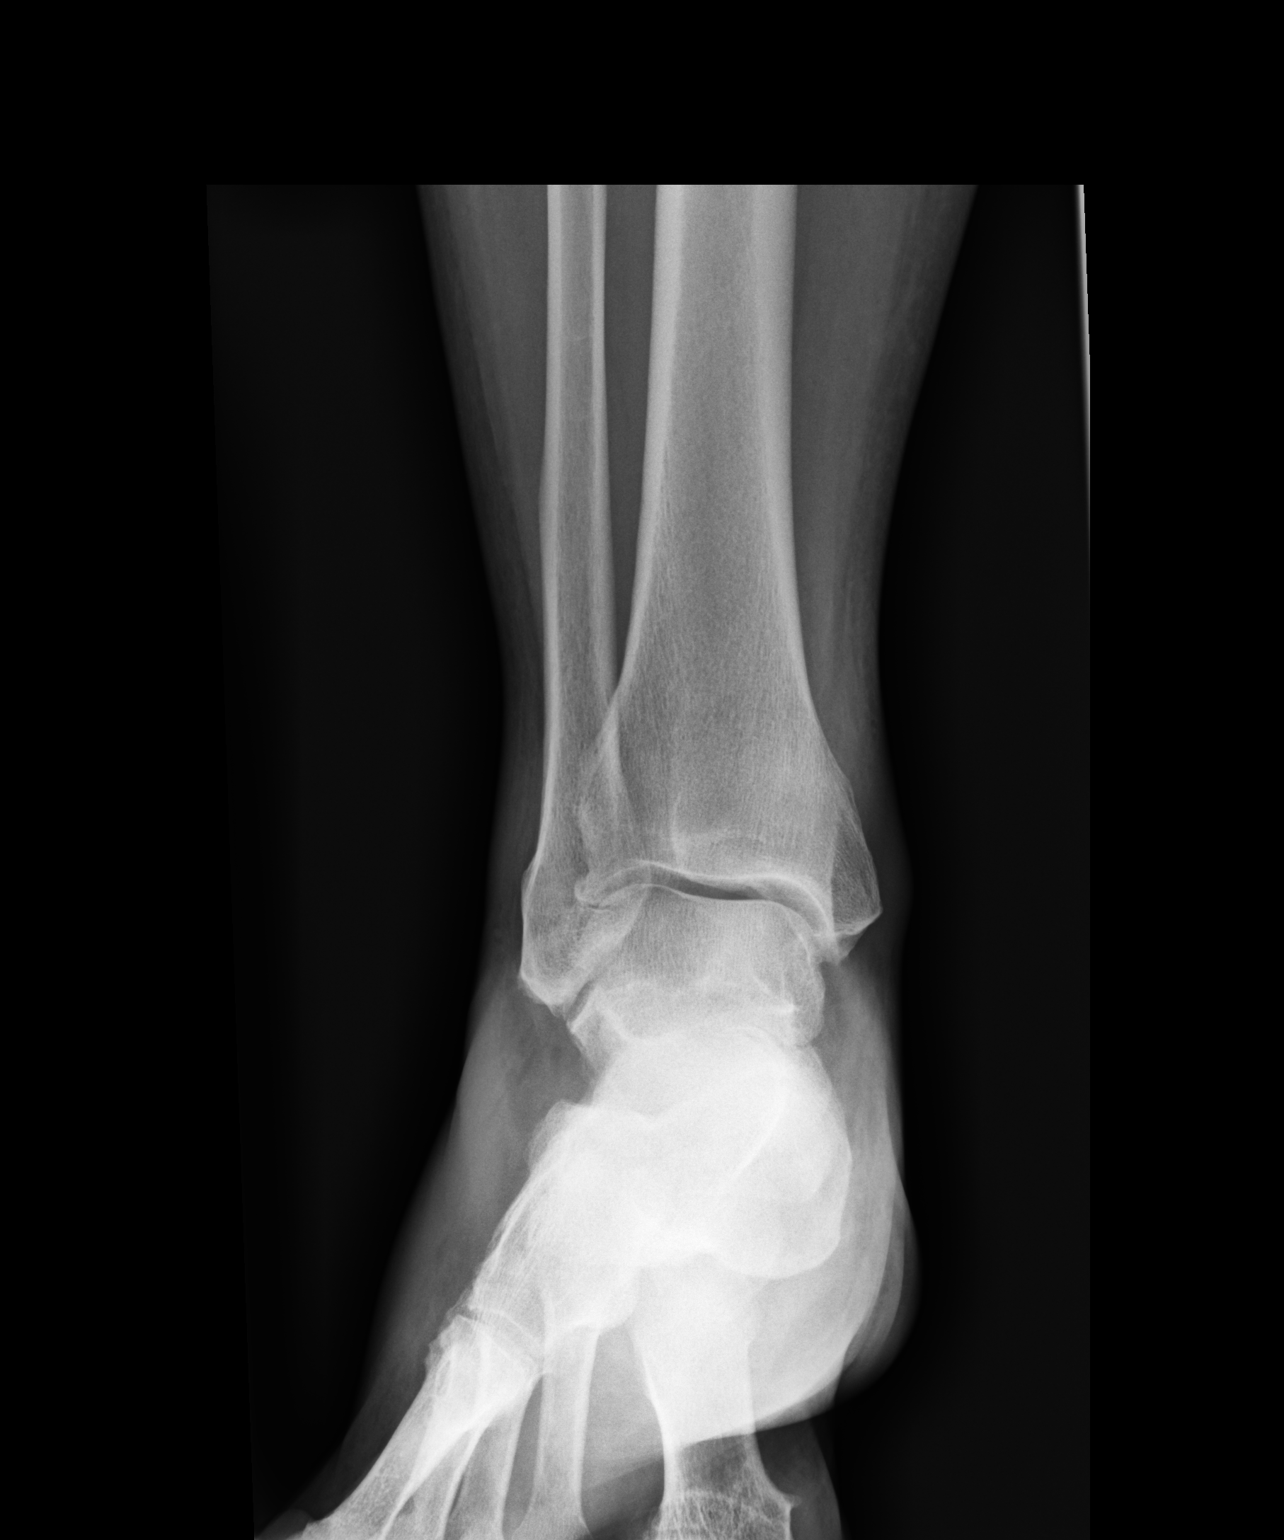

[dg ankle complete right (2 of 3)]
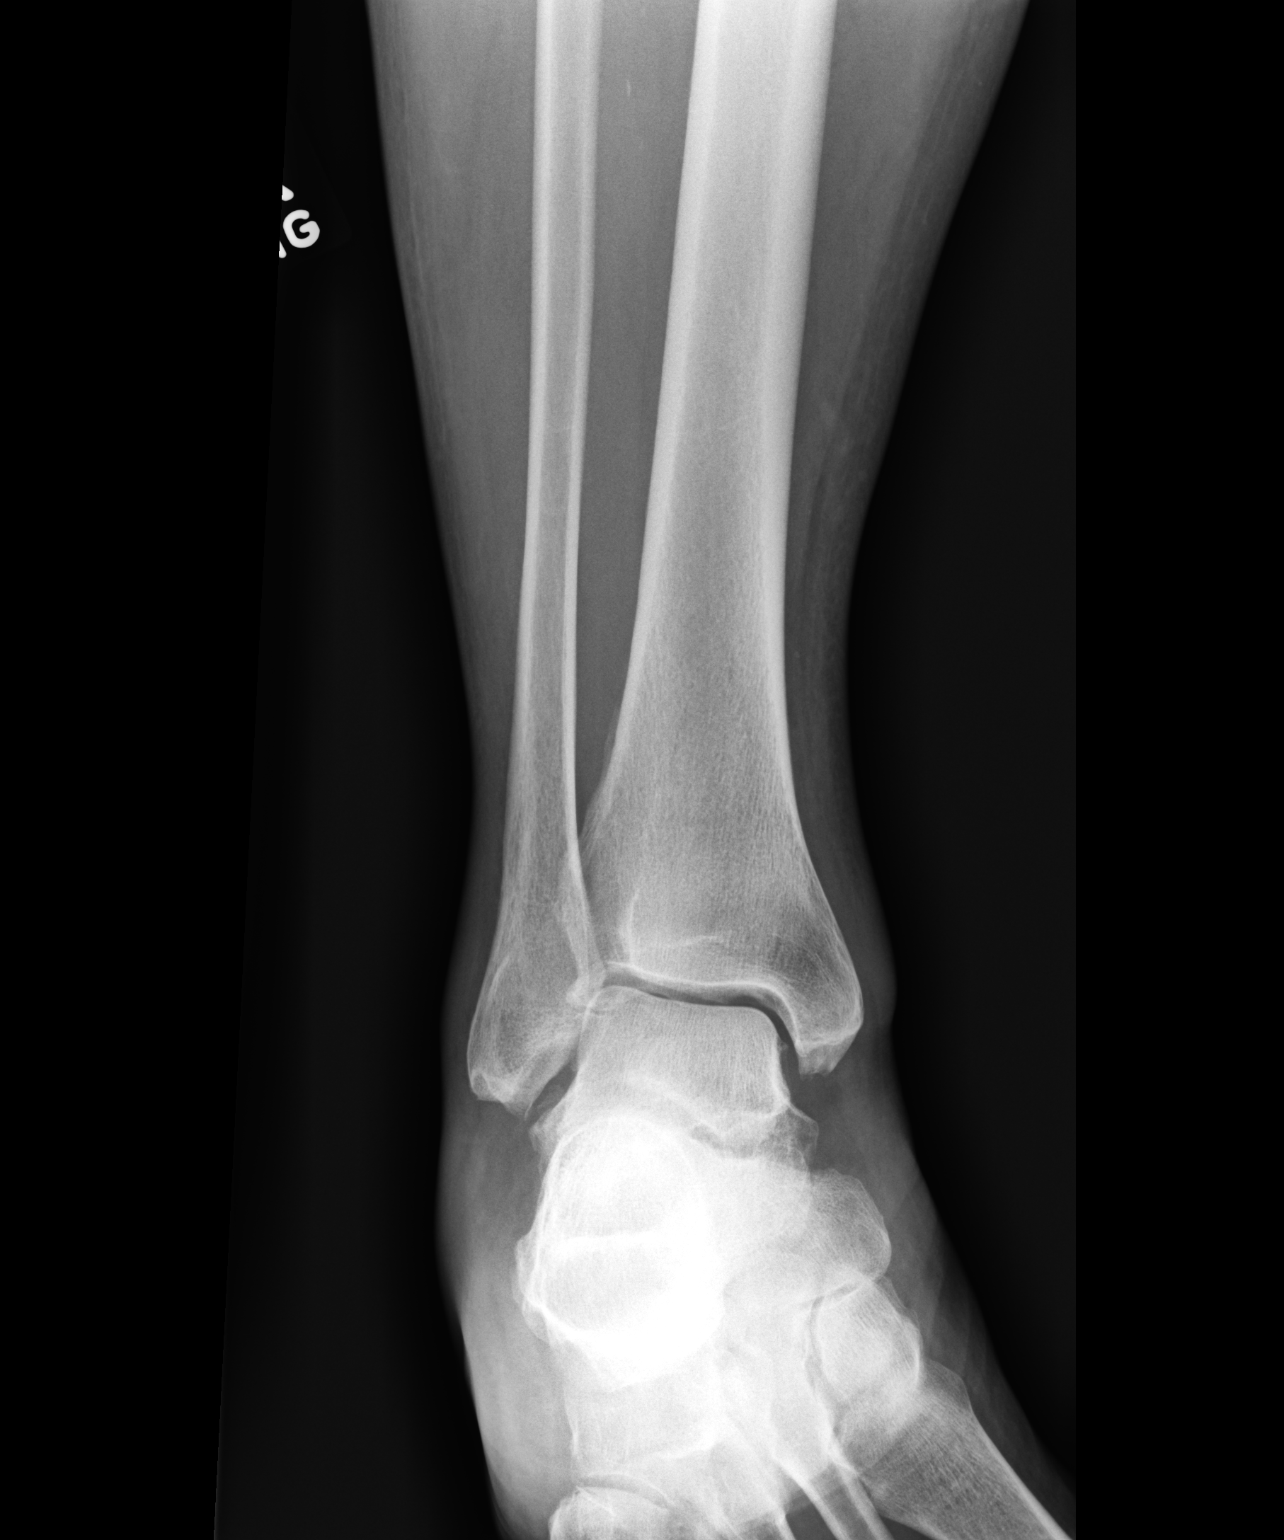

[dg ankle complete right (3 of 3)]
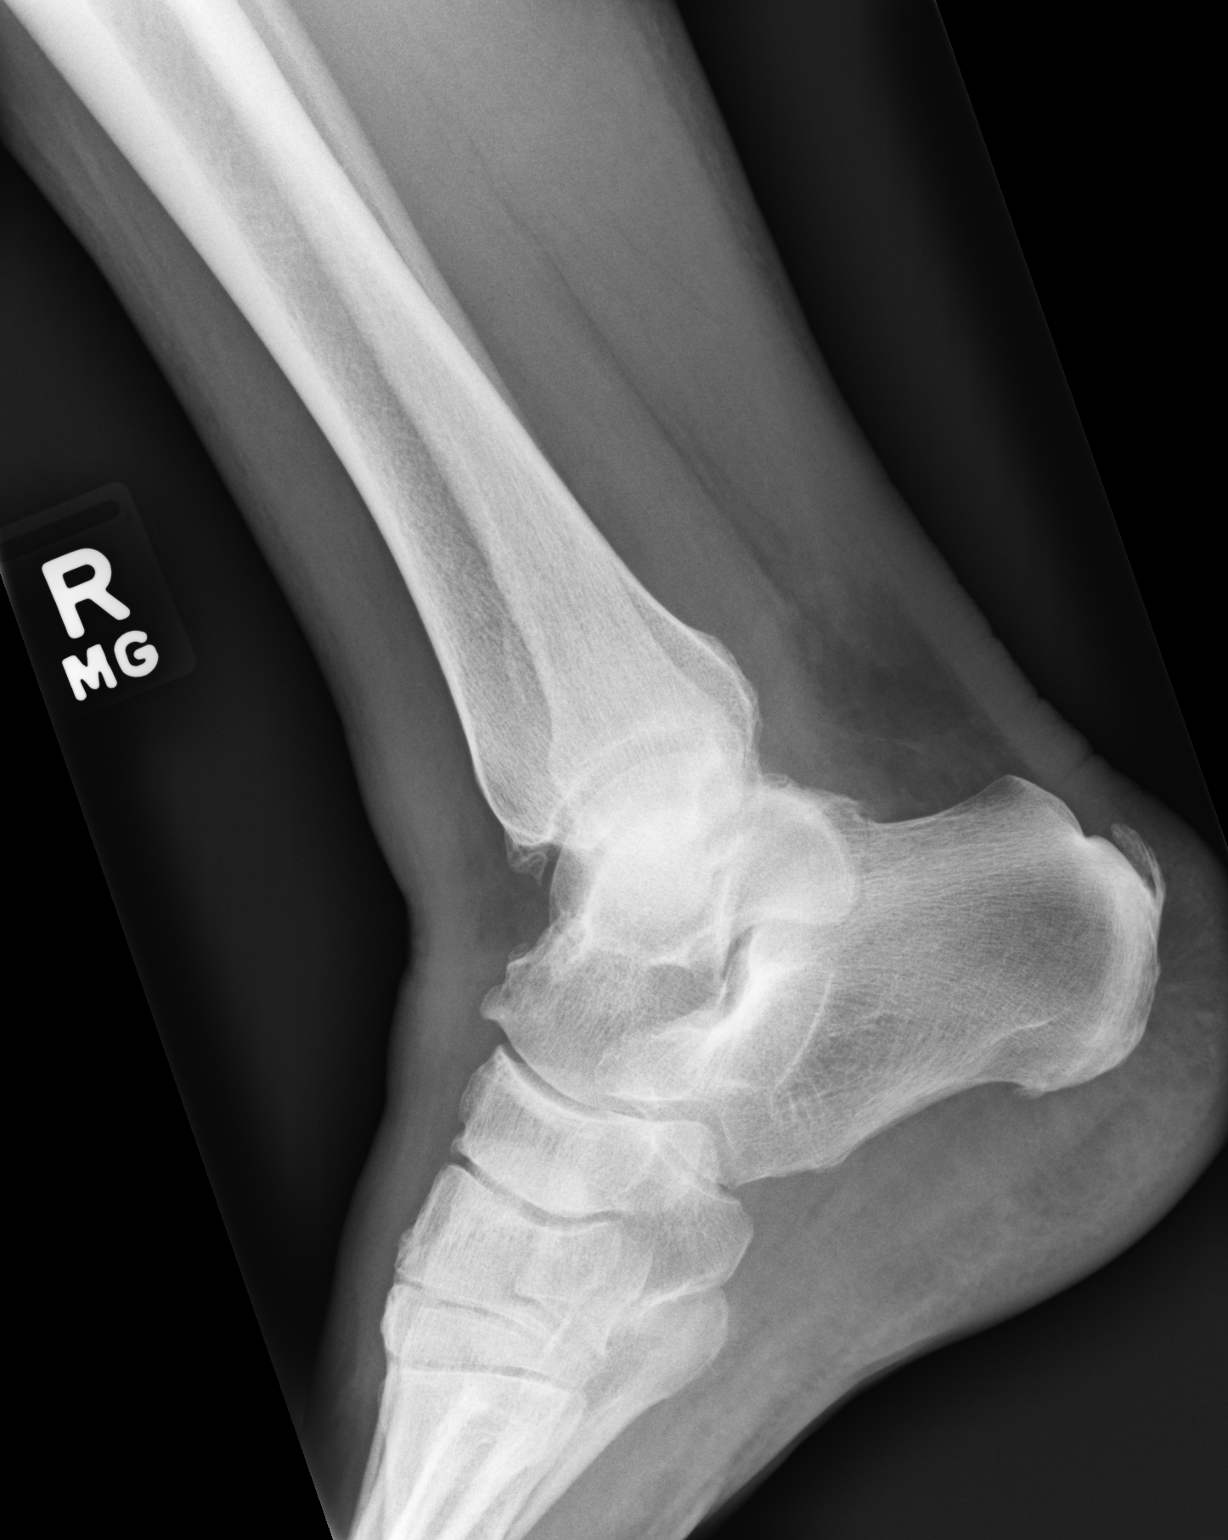

[3 of 3 positions shown; findings below may reference images not displayed]

FINDINGS: No fracture or malalignment. Mild medial and lateral joint space
degenerative change. Possible small calcified loose body versus bony
spur between the talus and the fibular malleolus.
IMPRESSION: No acute osseous abnormality. Mild arthritis with loose body versus
spur between the talus and the fibular malleolus

## 2020-11-20 ENCOUNTER — Other Ambulatory Visit: Payer: Self-pay | Admitting: Interventional Cardiology

## 2020-11-20 DIAGNOSIS — Z4789 Encounter for other orthopedic aftercare: Secondary | ICD-10-CM | POA: Diagnosis not present

## 2020-11-20 DIAGNOSIS — M7651 Patellar tendinitis, right knee: Secondary | ICD-10-CM | POA: Insufficient documentation

## 2020-11-20 DIAGNOSIS — Z85828 Personal history of other malignant neoplasm of skin: Secondary | ICD-10-CM | POA: Insufficient documentation

## 2020-11-20 DIAGNOSIS — I209 Angina pectoris, unspecified: Secondary | ICD-10-CM | POA: Insufficient documentation

## 2020-11-20 DIAGNOSIS — M25559 Pain in unspecified hip: Secondary | ICD-10-CM | POA: Insufficient documentation

## 2020-11-20 DIAGNOSIS — Z96651 Presence of right artificial knee joint: Secondary | ICD-10-CM | POA: Diagnosis not present

## 2020-11-27 DIAGNOSIS — G894 Chronic pain syndrome: Secondary | ICD-10-CM | POA: Diagnosis not present

## 2020-11-27 DIAGNOSIS — G4700 Insomnia, unspecified: Secondary | ICD-10-CM | POA: Diagnosis not present

## 2020-11-27 DIAGNOSIS — G609 Hereditary and idiopathic neuropathy, unspecified: Secondary | ICD-10-CM | POA: Diagnosis not present

## 2020-11-27 DIAGNOSIS — R69 Illness, unspecified: Secondary | ICD-10-CM | POA: Diagnosis not present

## 2020-11-28 DIAGNOSIS — G4733 Obstructive sleep apnea (adult) (pediatric): Secondary | ICD-10-CM | POA: Diagnosis not present

## 2020-11-28 DIAGNOSIS — G4731 Primary central sleep apnea: Secondary | ICD-10-CM | POA: Diagnosis not present

## 2020-12-21 DIAGNOSIS — M25661 Stiffness of right knee, not elsewhere classified: Secondary | ICD-10-CM | POA: Diagnosis not present

## 2020-12-21 DIAGNOSIS — Z96651 Presence of right artificial knee joint: Secondary | ICD-10-CM | POA: Diagnosis not present

## 2020-12-21 DIAGNOSIS — M6281 Muscle weakness (generalized): Secondary | ICD-10-CM | POA: Diagnosis not present

## 2020-12-28 DIAGNOSIS — G4733 Obstructive sleep apnea (adult) (pediatric): Secondary | ICD-10-CM | POA: Diagnosis not present

## 2020-12-28 DIAGNOSIS — G4731 Primary central sleep apnea: Secondary | ICD-10-CM | POA: Diagnosis not present

## 2021-01-01 DIAGNOSIS — G609 Hereditary and idiopathic neuropathy, unspecified: Secondary | ICD-10-CM | POA: Diagnosis not present

## 2021-01-01 DIAGNOSIS — G4700 Insomnia, unspecified: Secondary | ICD-10-CM | POA: Diagnosis not present

## 2021-01-01 DIAGNOSIS — R69 Illness, unspecified: Secondary | ICD-10-CM | POA: Diagnosis not present

## 2021-01-01 DIAGNOSIS — G894 Chronic pain syndrome: Secondary | ICD-10-CM | POA: Diagnosis not present

## 2021-01-02 DIAGNOSIS — G4733 Obstructive sleep apnea (adult) (pediatric): Secondary | ICD-10-CM | POA: Diagnosis not present

## 2021-01-11 ENCOUNTER — Other Ambulatory Visit: Payer: Self-pay | Admitting: Otolaryngology

## 2021-01-16 ENCOUNTER — Other Ambulatory Visit: Payer: Self-pay

## 2021-01-16 ENCOUNTER — Encounter (HOSPITAL_COMMUNITY): Payer: Self-pay | Admitting: Otolaryngology

## 2021-01-16 NOTE — Progress Notes (Signed)
patient voiced understanding of new arrival time of 0630 tomorrow

## 2021-01-16 NOTE — Progress Notes (Addendum)
PCP - Dr. Gaynelle Arabian  Cardiologist : Dr Irish Lack.  Dr. Radford Pax- sleep study.  mitral regurgitation. Wayne [name] denies chest pain or shortness of breath. Patient denies having any s/s of Covid in his household.  Patient denies any known exposure to Covid.   Wayne [name] reported that he was not given instructions on holding ASA or Plavix- patient has continuing these medications.  I instructed Wayne [name] to shower with antibiotic soap, if it is available.  Dry off with a clean towel. Do not put lotion, powder, cologne or deodorant or makeup.No jewelry or piercings. Men may shave their face and neck. Woman should not shave. No nail polish, artificial or acrylic nails. Wear clean clothes, brush your teeth. Glasses, contact lens,dentures or partials may not be worn in the OR. If you need to wear them, please bring a case for glasses, do not wear contacts or bring a case, the hospital does not have contact cases, dentures or partials will have to be removed , make sure they are clean, we will provide a denture cup to put them in. You will need some one to drive you home and a responsible person over the age of 65 to stay with you for the first 24 hours after surgery.

## 2021-01-17 ENCOUNTER — Ambulatory Visit (HOSPITAL_COMMUNITY): Payer: Medicare HMO | Admitting: Certified Registered Nurse Anesthetist

## 2021-01-17 ENCOUNTER — Ambulatory Visit (HOSPITAL_COMMUNITY)
Admission: RE | Admit: 2021-01-17 | Discharge: 2021-01-17 | Disposition: A | Discharge status: Home / Self Care | Payer: Medicare HMO | Attending: Otolaryngology | Admitting: Otolaryngology

## 2021-01-17 ENCOUNTER — Encounter (HOSPITAL_COMMUNITY)
Admission: RE | Disposition: A | Discharge status: Home / Self Care | Payer: Self-pay | Source: Home / Self Care | Attending: Otolaryngology

## 2021-01-17 ENCOUNTER — Encounter (HOSPITAL_COMMUNITY): Payer: Self-pay | Admitting: Otolaryngology

## 2021-01-17 ENCOUNTER — Other Ambulatory Visit: Payer: Self-pay

## 2021-01-17 DIAGNOSIS — Z79899 Other long term (current) drug therapy: Secondary | ICD-10-CM | POA: Diagnosis not present

## 2021-01-17 DIAGNOSIS — M1990 Unspecified osteoarthritis, unspecified site: Secondary | ICD-10-CM | POA: Diagnosis not present

## 2021-01-17 DIAGNOSIS — Z955 Presence of coronary angioplasty implant and graft: Secondary | ICD-10-CM | POA: Diagnosis not present

## 2021-01-17 DIAGNOSIS — I10 Essential (primary) hypertension: Secondary | ICD-10-CM | POA: Diagnosis not present

## 2021-01-17 DIAGNOSIS — G4733 Obstructive sleep apnea (adult) (pediatric): Secondary | ICD-10-CM | POA: Diagnosis not present

## 2021-01-17 DIAGNOSIS — F32A Depression, unspecified: Secondary | ICD-10-CM | POA: Insufficient documentation

## 2021-01-17 DIAGNOSIS — R69 Illness, unspecified: Secondary | ICD-10-CM | POA: Diagnosis not present

## 2021-01-17 DIAGNOSIS — I252 Old myocardial infarction: Secondary | ICD-10-CM | POA: Insufficient documentation

## 2021-01-17 DIAGNOSIS — I2510 Atherosclerotic heart disease of native coronary artery without angina pectoris: Secondary | ICD-10-CM | POA: Insufficient documentation

## 2021-01-17 HISTORY — DX: Acute myocardial infarction, unspecified: I21.9

## 2021-01-17 HISTORY — DX: Depression, unspecified: F32.A

## 2021-01-17 HISTORY — DX: Unspecified osteoarthritis, unspecified site: M19.90

## 2021-01-17 HISTORY — DX: Essential (primary) hypertension: I10

## 2021-01-17 HISTORY — PX: DRUG INDUCED ENDOSCOPY: SHX6808

## 2021-01-17 LAB — BASIC METABOLIC PANEL
Anion gap: 6 (ref 5–15)
BUN: 14 mg/dL (ref 8–23)
CO2: 27 mmol/L (ref 22–32)
Calcium: 9 mg/dL (ref 8.9–10.3)
Chloride: 102 mmol/L (ref 98–111)
Creatinine, Ser: 0.83 mg/dL (ref 0.61–1.24)
GFR, Estimated: >60 mL/min (ref >60)
Glucose, Bld: 111 mg/dL — ABNORMAL HIGH (ref 70–99)
Potassium: 4.4 mmol/L (ref 3.5–5.1)
Sodium: 135 mmol/L (ref 135–145)

## 2021-01-17 LAB — CBC
HCT: 41.2 % (ref 39.0–52.0)
Hemoglobin: 13.7 g/dL (ref 13.0–17.0)
MCH: 30.6 pg (ref 26.0–34.0)
MCHC: 33.3 g/dL (ref 30.0–36.0)
MCV: 92.2 fL (ref 80.0–100.0)
Platelets: 209 10*3/uL (ref 150–400)
RBC: 4.47 MIL/uL (ref 4.22–5.81)
RDW: 12.1 % (ref 11.5–15.5)
WBC: 7.5 10*3/uL (ref 4.0–10.5)
nRBC: 0 % (ref 0.0–0.2)

## 2021-01-17 SURGERY — DRUG INDUCED SLEEP ENDOSCOPY
Anesthesia: Monitor Anesthesia Care | Site: Nose

## 2021-01-17 MED ORDER — LACTATED RINGERS IV SOLN: INTRAVENOUS | Status: DC

## 2021-01-17 MED ORDER — CHLORHEXIDINE GLUCONATE 0.12 % MT SOLN
15.0000 mL | Freq: Once | OROMUCOSAL | Status: AC
  Administered 2021-01-17: 15 mL via OROMUCOSAL
  Filled 2021-01-17: qty 15

## 2021-01-17 MED ORDER — PROPOFOL 10 MG/ML IV BOLUS
INTRAVENOUS | Status: AC
  Filled 2021-01-17: qty 20

## 2021-01-17 MED ORDER — ORAL CARE MOUTH RINSE: 15.0000 mL | Freq: Once | OROMUCOSAL | Status: AC

## 2021-01-17 MED ORDER — PROPOFOL 10 MG/ML IV BOLUS
INTRAVENOUS | Status: DC | PRN
  Administered 2021-01-17: 20 mg via INTRAVENOUS

## 2021-01-17 MED ORDER — ACETAMINOPHEN 500 MG PO TABS
1000.0000 mg | ORAL_TABLET | Freq: Once | ORAL | Status: AC
  Administered 2021-01-17: 1000 mg via ORAL
  Filled 2021-01-17: qty 2

## 2021-01-17 MED ORDER — FENTANYL CITRATE (PF) 100 MCG/2ML IJ SOLN: 25.0000 ug | INTRAMUSCULAR | Status: DC | PRN

## 2021-01-17 MED ORDER — LIDOCAINE 2% (20 MG/ML) 5 ML SYRINGE
INTRAMUSCULAR | Status: DC | PRN
Start: 2021-01-17 — End: 2021-01-17
  Administered 2021-01-17: 40 mg via INTRAVENOUS

## 2021-01-17 MED ORDER — PROPOFOL 500 MG/50ML IV EMUL
INTRAVENOUS | Status: DC | PRN
  Administered 2021-01-17: 100 ug/kg/min via INTRAVENOUS

## 2021-01-17 SURGICAL SUPPLY — 16 items
BAG COUNTER SPONGE SURGICOUNT (BAG) IMPLANT
CANISTER SUCT 1200ML W/VALVE (MISCELLANEOUS) ×2 IMPLANT
DRAPE HALF SHEET 40X57 (DRAPES) ×1 IMPLANT
GAUZE SPONGE 4X4 12PLY STRL (GAUZE/BANDAGES/DRESSINGS) ×1 IMPLANT
GLOVE SURG ENC TEXT LTX SZ7 (GLOVE) ×2 IMPLANT
KIT BASIN OR (CUSTOM PROCEDURE TRAY) ×2 IMPLANT
NDL PRECISIONGLIDE 27X1.5 (NEEDLE) IMPLANT
NEEDLE PRECISIONGLIDE 27X1.5 (NEEDLE) IMPLANT
PATTIES SURGICAL .5 X3 (DISPOSABLE) IMPLANT
SHEET MEDIUM DRAPE 40X70 STRL (DRAPES) ×3 IMPLANT
SOL ANTI FOG 6CC (MISCELLANEOUS) ×1 IMPLANT
SOLUTION ANTI FOG 6CC (MISCELLANEOUS) ×1
SPONGE NEURO XRAY DETECT 1X3 (DISPOSABLE) IMPLANT
SYR CONTROL 10ML LL (SYRINGE) IMPLANT
TOWEL GREEN STERILE FF (TOWEL DISPOSABLE) ×2 IMPLANT
TUBE CONNECTING 20X1/4 (TUBING) IMPLANT

## 2021-01-17 NOTE — Anesthesia Preprocedure Evaluation (Addendum)
Anesthesia Evaluation  Patient identified by MRN, date of birth, ID band Patient awake    Reviewed: Allergy & Precautions, H&P , NPO status , Patient's Chart, lab work & pertinent test results  Airway Mallampati: IV  TM Distance: >3 FB Neck ROM: Full    Dental no notable dental hx. (+) Teeth Intact, Dental Advisory Given   Pulmonary sleep apnea ,    Pulmonary exam normal breath sounds clear to auscultation       Cardiovascular hypertension, Pt. on medications + CAD, + Past MI and + Cardiac Stents  + dysrhythmias  Rhythm:Regular Rate:Normal     Neuro/Psych Depression negative neurological ROS     GI/Hepatic negative GI ROS, Neg liver ROS,   Endo/Other  negative endocrine ROS  Renal/GU negative Renal ROS  negative genitourinary   Musculoskeletal  (+) Arthritis , Osteoarthritis,    Abdominal   Peds  Hematology negative hematology ROS (+)   Anesthesia Other Findings   Reproductive/Obstetrics negative OB ROS                            Anesthesia Physical Anesthesia Plan  ASA: 3  Anesthesia Plan: MAC   Post-op Pain Management: Tylenol PO (pre-op) and Minimal or no pain anticipated   Induction: Intravenous  PONV Risk Score and Plan: 1 and Propofol infusion  Airway Management Planned: Natural Airway and Nasal Cannula  Additional Equipment:   Intra-op Plan:   Post-operative Plan:   Informed Consent: I have reviewed the patients History and Physical, chart, labs and discussed the procedure including the risks, benefits and alternatives for the proposed anesthesia with the patient or authorized representative who has indicated his/her understanding and acceptance.     Dental advisory given  Plan Discussed with: CRNA  Anesthesia Plan Comments:         Anesthesia Quick Evaluation

## 2021-01-17 NOTE — H&P (Signed)
Wayne [name] is an 71 y.o. male.   Chief Complaint: Obstructive sleep apnea HPI: History of obstructive sleep apnea, unable to tolerate CPAP treatment  Past Medical History:  Diagnosis Date   Arthritis    Basal cell carcinoma    MOHS   Coronary artery disease 09/2009   S/P MI and PTCA--X 4 STENTS   Depression    situational   Diverticulosis    Hip pain    CHRONIC   Hyperlipidemia    Hypertension    LBBB (left bundle branch block)    Myocardial infarction ([place])    Neuropathy    IDIOPATHIC   Neuropathy    Sleep apnea 11/22/2016   does not wear CPAP    Past Surgical History:  Procedure Laterality Date   CARDIAC CATHETERIZATION  09/11   CHOLECYSTECTOMY     JOINT REPLACEMENT  11/22/2016   right total knee 10/2016   LUMBAR LAMINECTOMY/DECOMPRESSION MICRODISCECTOMY Left 11/26/2016   Procedure: Laminectomy for facet/synovial cyst - left - Lumbar four-Lumbar five;  Surgeon: Earnie Larsson, MD;  Location: [facility];  Service: Neurosurgery;  Laterality: Left;   RIGHT SHOULDER BONE TUMOR     SLIPPED CAPITAL FEMORAL EPIPHYSIS PINNING      Family History  Problem Relation Age of Onset   Cancer Mother    Lung cancer Father    Social History:  reports that he has never smoked. He has never used smokeless tobacco. He reports that he does not drink alcohol and does not use drugs.  Allergies:  Allergies  Allergen Reactions   Mirapex [Pramipexole Dihydrochloride] Other (See Comments)    Patient does not remember   Headache   Trazodone And Nefazodone Other (See Comments)    DROWSY    Medications Prior to Admission  Medication Sig Dispense Refill   aspirin EC 81 MG tablet Take 1 tablet (81 mg total) by mouth daily.     clopidogrel (PLAVIX) 75 MG tablet TAKE ONE TABLET BY MOUTH DAILY 90 tablet 1   diclofenac Sodium (VOLTAREN) 1 % GEL Apply 1 application topically daily as needed (pain).     DULoxetine (CYMBALTA) 60 MG capsule Take 120 mg by mouth daily.     lisinopril (ZESTRIL) 5 MG  tablet TAKE ONE TABLET BY MOUTH DAILY *PLEASE MAKE YEARLY APPOINTMENT WITH DR. VARANASI FOR SEPTEMBER 2022 FOR FUTURE REFILLS* 90 tablet 0   metoprolol succinate (TOPROL-XL) 25 MG 24 hr tablet Take 0.5 tablets (12.5 mg total) by mouth 2 (two) times daily. TAKE 1/2 TABLET BY MOUTH TWO TIMES A DAY 180 tablet 1   nitroGLYCERIN (NITROSTAT) 0.4 MG SL tablet Dissolve one tablet under tongue every 5 minutes as needed for chest pain; up to 3 doses--call 911 if pain persist 25 tablet 3   Omega-3 Fatty Acids (FISH OIL) 1000 MG CAPS Take 2 capsules (2,000 mg total) by mouth daily.  0   oxyCODONE-acetaminophen (PERCOCET) 10-325 MG per tablet Take 1 tablet every 6 (six) hours as needed by mouth for pain.      pregabalin (LYRICA) 150 MG capsule Take 150 mg by mouth 2 (two) times daily.     rosuvastatin (CRESTOR) 40 MG tablet TAKE ONE TABLET BY MOUTH EVERY EVENING *NEED APPOINTMENT FOR REFILLS* 90 tablet 3   zolpidem (AMBIEN) 10 MG tablet Take 10 mg by mouth at bedtime.  3   docusate sodium (COLACE) 100 MG capsule Take 100 mg by mouth daily as needed for moderate constipation.      Results for orders placed  or performed during the hospital encounter of 01/17/21 (from the past 48 hour(s))  Basic metabolic panel per protocol     Status: Abnormal   Collection Time: 01/17/21  7:13 AM  Result Value Ref Range   Sodium 135 135 - 145 mmol/L   Potassium 4.4 3.5 - 5.1 mmol/L   Chloride 102 98 - 111 mmol/L   CO2 27 22 - 32 mmol/L   Glucose, Bld 111 (H) 70 - 99 mg/dL    Comment: Glucose reference range applies only to samples taken after fasting for at least 8 hours.   BUN 14 8 - 23 mg/dL   Creatinine, Ser 0.83 0.61 - 1.24 mg/dL   Calcium 9.0 8.9 - 10.3 mg/dL   GFR, Estimated >60 >60 mL/min    Comment: (NOTE) Calculated using the CKD-EPI Creatinine Equation (2021)    Anion gap 6 5 - 15    Comment: Performed at [facility] [address], [place], [place] [address]  CBC per protocol     Status: None    Collection Time: 01/17/21  7:13 AM  Result Value Ref Range   WBC 7.5 4.0 - 10.5 K/uL   RBC 4.47 4.22 - 5.81 MIL/uL   Hemoglobin 13.7 13.0 - 17.0 g/dL   HCT 41.2 39.0 - 52.0 %   MCV 92.2 80.0 - 100.0 fL   MCH 30.6 26.0 - 34.0 pg   MCHC 33.3 30.0 - 36.0 g/dL   RDW 12.1 11.5 - 15.5 %   Platelets 209 150 - 400 K/uL   nRBC 0.0 0.0 - 0.2 %    Comment: Performed at [facility], [place] [address], [place], [place] [address]   No results found.  Review of Systems  Respiratory:  Positive for apnea.    Blood pressure 138/76, pulse (!) 55, temperature 98.3 F (36.8 C), temperature source Oral, resp. rate 18, height 5' 10.5" (1.791 m), weight 104.3 kg, SpO2 97 %. Physical Exam Constitutional:      Appearance: Normal appearance.  Cardiovascular:     Rate and Rhythm: Normal rate.  Pulmonary:     Effort: Pulmonary effort is normal.  Musculoskeletal:     Cervical back: Normal range of motion.  Neurological:     Mental Status: He is alert.     Assessment/Plan Patient admitted for drug-induced sleep endoscopy under sedated anesthesia as an outpatient.  Jerrell Belfast, MD 01/17/2021, 9:24 AM

## 2021-01-17 NOTE — Anesthesia Postprocedure Evaluation (Signed)
Anesthesia Post Note  Patient: Wayne [name]  Procedure(s) Performed: DRUG INDUCED SLEEP ENDOSCOPY (Nose)     Patient location during evaluation: PACU Anesthesia Type: MAC Level of consciousness: awake and alert Pain management: pain level controlled Vital Signs Assessment: post-procedure vital signs reviewed and stable Respiratory status: spontaneous breathing, nonlabored ventilation and respiratory function stable Cardiovascular status: stable and blood pressure returned to baseline Postop Assessment: no apparent nausea or vomiting Anesthetic complications: no   No notable events documented.  Last Vitals:  Vitals:   01/17/21 1032 01/17/21 1047  BP: 126/68 136/61  Pulse: (!) 50 (!) 50  Resp: 13 14  Temp:  36.6 C  SpO2: 99% 99%    Last Pain:  Vitals:   01/17/21 1047  TempSrc:   PainSc: 0-No pain                 Keandrea Tapley,W. [place]

## 2021-01-17 NOTE — Op Note (Signed)
Operative Note: DRUG INDUCED SLEEP ENDOSCOPY  Patient: Wayne [name]  Medical record number: [id number]  Date:01/17/2021  Pre-operative Indications: 1.  Obstructive Sleep Apnea  Postoperative Indications: Same  Surgical Procedure: 1.  Drug Induced Sleep Endoscopy (DISE)  Anesthesia: MAC with IV sedation  Surgeon: Delsa Bern, M.D.  Complications: None  BMI: 32.5  EBL: None  Findings: There is no evidence of complete concentric palatal obstruction.  Anatomically the patient should be a candidate for hypoglossal nerve stimulation therapy.     Brief History: The patient is a 71 y.o. male with a history of obstructive sleep apnea. The patient has undergone previous sleep study which showed mild to moderate levels of obstructive sleep apnea.  The patient was prescribed CPAP which they consistently attempted to use without success.  Given the patient's history and findings, the above drug-induced sleep endoscopy was recommended to assess the patient's anatomic level of apnea.  Risks and benefits were discussed in detail with the patient today understand and agree with our plan for surgery which is scheduled at [place] in OR under sedated anesthesia as an outpatient.  Surgical Procedure: The patient is brought to the operating room on 01/17/2021 and placed in supine position on the operating table.  Intravenous sedated anesthesia was established without difficulty using the standard drug-induced sleep endoscopy protocol. When the patient was adequately anesthetized, surgical timeout was performed and correct identification of the patient and the surgical procedure.    A propofol infusion was administered and the patient was monitored carefully to achieve a level of sedation appropriate for DISE.  The patient did not respond to verbal commands but still had spontaneous respiration, sleep disordered breathing and associated desaturations were observed.  With the patient under adequate  sedated anesthesia the flexible nasal laryngoscope was passed without difficulty.  The patient's nasal cavity showed mild septal deviation and turbinate hypertrophy without obstruction.  The endoscope was then passed to visualize the velopharynx, oropharynx, tongue base and epiglottis to assess areas of obstruction.  Patient's airway showed anterior to posterior obstruction involving the soft palate and tongue base.   There was no evidence of complete concentric palatal obstruction and the patient appeared to be a candidate anatomically for hypoglossal nerve stimulation therapy.  Surgical sponge count was correct. Patient was awakened from anesthetic and transferred from the operating room to the recovery room in stable condition. There were no complications and no blood loss.   Delsa Bern, M.D. [facility] 01/17/2021

## 2021-01-17 NOTE — Transfer of Care (Signed)
Immediate Anesthesia Transfer of Care Note  Patient: Wayne [name]  Procedure(s) Performed: DRUG INDUCED SLEEP ENDOSCOPY (Nose)  Patient Location: PACU  Anesthesia Type:MAC  Level of Consciousness: awake, alert  and oriented  Airway & Oxygen Therapy: Patient Spontanous Breathing  Post-op Assessment: Report given to RN and Post -op Vital signs reviewed and stable  Post vital signs: Reviewed and stable  Last Vitals:  Vitals Value Taken Time  BP 136/61 01/17/21 1049  Temp 36.6 C 01/17/21 1047  Pulse 50 01/17/21 1052  Resp 17 01/17/21 1052  SpO2 99 % 01/17/21 1052  Vitals shown include unvalidated device data.  Last Pain:  Vitals:   01/17/21 1047  TempSrc:   PainSc: 0-No pain      Patients Stated Pain Goal: 4 (42/35/36 1443)  Complications: No notable events documented.

## 2021-01-18 ENCOUNTER — Encounter (HOSPITAL_COMMUNITY): Payer: Self-pay | Admitting: Otolaryngology

## 2021-01-23 ENCOUNTER — Ambulatory Visit: Payer: Medicare HMO | Admitting: Interventional Cardiology

## 2021-01-28 DIAGNOSIS — G4733 Obstructive sleep apnea (adult) (pediatric): Secondary | ICD-10-CM | POA: Diagnosis not present

## 2021-01-28 DIAGNOSIS — G4731 Primary central sleep apnea: Secondary | ICD-10-CM | POA: Diagnosis not present

## 2021-02-05 DIAGNOSIS — G894 Chronic pain syndrome: Secondary | ICD-10-CM | POA: Diagnosis not present

## 2021-02-05 DIAGNOSIS — R69 Illness, unspecified: Secondary | ICD-10-CM | POA: Diagnosis not present

## 2021-02-05 DIAGNOSIS — G609 Hereditary and idiopathic neuropathy, unspecified: Secondary | ICD-10-CM | POA: Diagnosis not present

## 2021-02-05 DIAGNOSIS — G4700 Insomnia, unspecified: Secondary | ICD-10-CM | POA: Diagnosis not present

## 2021-02-14 ENCOUNTER — Other Ambulatory Visit: Payer: Self-pay | Admitting: Interventional Cardiology

## 2021-02-25 ENCOUNTER — Other Ambulatory Visit: Payer: Self-pay | Admitting: Interventional Cardiology

## 2021-02-27 DIAGNOSIS — G4731 Primary central sleep apnea: Secondary | ICD-10-CM | POA: Diagnosis not present

## 2021-02-28 DIAGNOSIS — G4731 Primary central sleep apnea: Secondary | ICD-10-CM | POA: Diagnosis not present

## 2021-02-28 DIAGNOSIS — G4733 Obstructive sleep apnea (adult) (pediatric): Secondary | ICD-10-CM | POA: Diagnosis not present

## 2021-03-05 DIAGNOSIS — I25119 Atherosclerotic heart disease of native coronary artery with unspecified angina pectoris: Secondary | ICD-10-CM | POA: Diagnosis not present

## 2021-03-05 DIAGNOSIS — U071 COVID-19: Secondary | ICD-10-CM | POA: Diagnosis not present

## 2021-03-05 DIAGNOSIS — R7303 Prediabetes: Secondary | ICD-10-CM | POA: Diagnosis not present

## 2021-03-08 ENCOUNTER — Telehealth: Payer: Self-pay | Admitting: Interventional Cardiology

## 2021-03-08 MED ORDER — METOPROLOL SUCCINATE ER 25 MG PO TB24: ORAL_TABLET | ORAL | 0 refills | Status: DC

## 2021-03-08 MED ORDER — CLOPIDOGREL BISULFATE 75 MG PO TABS: 75.0000 mg | ORAL_TABLET | Freq: Every day | ORAL | 0 refills | Status: DC

## 2021-03-08 MED ORDER — ROSUVASTATIN CALCIUM 40 MG PO TABS: ORAL_TABLET | ORAL | 0 refills | Status: DC

## 2021-03-08 NOTE — Telephone Encounter (Signed)
Pt c/o medication issue:  1. Name of Medication: metoprolol succinate (TOPROL-XL) 25 MG 24 hr tablet; clopidogrel (PLAVIX) 75 MG tablet; rosuvastatin (CRESTOR) 40 MG tablet; lisinopril (ZESTRIL) 5 MG tablet  2. How are you currently taking this medication (dosage and times per day)? As written  3. Are you having a reaction (difficulty breathing--STAT)? No   4. What is your medication issue? Patient needs medication filled. Right now, Dr. Irish Lack has no openings available. Please fill medication

## 2021-03-08 NOTE — Telephone Encounter (Signed)
Called pt and left message informing pt that his medications were sent to his pharmacy for a 30 day supply, asking pt to keep upcoming appt in February 2023 with Dr. Irish Lack before anymore refills. Pt's medications were sent to pt's pharmacy as requested. Confirmation received

## 2021-03-11 NOTE — Progress Notes (Unsigned)
Cardiology Office Note   Date:  03/11/2021   ID:  Valor, Quaintance 03/30/1950, MRN [id number]  PCP:  Gaynelle Arabian, MD    No chief complaint on file.  CAD  Wt Readings from Last 3 Encounters:  01/17/21 230 lb (104.3 kg)  11/06/20 230 lb (104.3 kg)  05/29/20 230 lb (104.3 kg)       History of Present Illness: Wayne [name] is a 71 y.o. male    with coronary artery disease status post multivessel PCI in 2011, hyperlipidemia, left bundle branch block, sleep apnea.   He had hip surgery and knee surgery in 2019.  Both joints replaced.    Has chronic back pain. He has had repeated cortisone injections.    He has had a few injections in the left knee.    He is planning on retiring   Had some [facility] in 2021 showing: "  with coronary artery disease status post multivessel PCI in 2011, hyperlipidemia, left bundle branch block, sleep apnea.   He had hip surgery and knee surgery in 2019.  Both joints replaced.    Still has chronic back pain. He has had repeated cortisone injections.    He is wearing a mask when he goes out.   The patient does not have symptoms concerning for COVID-19 infection (fever, chills, cough, or new shortness of breath).    Since the last visit, he has had a few injections in the left knee.    He is planning on retiring "  Plan was : "No ischemia.  Normal EF.  If sx persist, would consider cath given that he had multiple stents several years ago, but no high risk features on this test. "   Past Medical History:  Diagnosis Date   Arthritis    Basal cell carcinoma    MOHS   Coronary artery disease 09/2009   S/P MI and PTCA--X 4 STENTS   Depression    situational   Diverticulosis    Hip pain    CHRONIC   Hyperlipidemia    Hypertension    LBBB (left bundle branch block)    Myocardial infarction ([place])    Neuropathy    IDIOPATHIC   Neuropathy    Sleep apnea 11/22/2016   does not wear CPAP    Past Surgical History:  Procedure  Laterality Date   CARDIAC CATHETERIZATION  09/11   CHOLECYSTECTOMY     DRUG INDUCED ENDOSCOPY N/A 01/17/2021   Procedure: DRUG INDUCED SLEEP ENDOSCOPY;  Surgeon: Jerrell Belfast, MD;  Location: [place];  Service: ENT;  Laterality: N/A;   JOINT REPLACEMENT  11/22/2016   right total knee 10/2016   LUMBAR LAMINECTOMY/DECOMPRESSION MICRODISCECTOMY Left 11/26/2016   Procedure: Laminectomy for facet/synovial cyst - left - Lumbar four-Lumbar five;  Surgeon: Earnie Larsson, MD;  Location: [place];  Service: Neurosurgery;  Laterality: Left;   RIGHT SHOULDER BONE TUMOR     SLIPPED CAPITAL FEMORAL EPIPHYSIS PINNING       Current Outpatient Medications  Medication Sig Dispense Refill   aspirin EC 81 MG tablet Take 1 tablet (81 mg total) by mouth daily.     clopidogrel (PLAVIX) 75 MG tablet Take 1 tablet (75 mg total) by mouth daily. 30 tablet 0   diclofenac Sodium (VOLTAREN) 1 % GEL Apply 1 application topically daily as needed (pain).     docusate sodium (COLACE) 100 MG capsule Take 100 mg by mouth daily as needed for moderate constipation.  DULoxetine (CYMBALTA) 60 MG capsule Take 120 mg by mouth daily.     lisinopril (ZESTRIL) 5 MG tablet Take 1 tablet (5 mg total) by mouth daily. Please keep upcoming appt in February 2023 with Dr. Irish Lack before anymore refills. Thank you Final Attempt 30 tablet 0   metoprolol succinate (TOPROL-XL) 25 MG 24 hr tablet TAKE 1/2 TABLET BY MOUTH TWO TIMES A DAY 30 tablet 0   nitroGLYCERIN (NITROSTAT) 0.4 MG SL tablet Dissolve one tablet under tongue every 5 minutes as needed for chest pain; up to 3 doses--call 911 if pain persist 25 tablet 3   Omega-3 Fatty Acids (FISH OIL) 1000 MG CAPS Take 2 capsules (2,000 mg total) by mouth daily.  0   oxyCODONE-acetaminophen (PERCOCET) 10-325 MG per tablet Take 1 tablet every 6 (six) hours as needed by mouth for pain.      pregabalin (LYRICA) 150 MG capsule Take 150 mg by mouth 2 (two) times daily.     rosuvastatin (CRESTOR) 40 MG  tablet TAKE ONE TABLET BY MOUTH EVERY EVENING *NEED APPOINTMENT FOR REFILLS* 30 tablet 0   zolpidem (AMBIEN) 10 MG tablet Take 10 mg by mouth at bedtime.  3   No current facility-administered medications for this visit.    Allergies:   Mirapex [pramipexole dihydrochloride] and Trazodone and nefazodone    Social History:  The patient  reports that he has never smoked. He has never used smokeless tobacco. He reports that he does not drink alcohol and does not use drugs.   Family History:  The patient's ***family history includes Cancer in his mother; Lung cancer in his father.    ROS:  Please see the history of present illness.   Otherwise, review of systems are positive for ***.   All other systems are reviewed and negative.    PHYSICAL EXAM: VS:  There were no vitals taken for this visit. , BMI There is no height or weight on file to calculate BMI. GEN: Well nourished, well developed, in no acute distress HEENT: normal Neck: no JVD, carotid bruits, or masses Cardiac: ***RRR; no murmurs, rubs, or gallops,no edema  Respiratory:  clear to auscultation bilaterally, normal work of breathing GI: soft, nontender, nondistended, + BS MS: no deformity or atrophy Skin: warm and dry, no rash Neuro:  Strength and sensation are intact Psych: euthymic mood, full affect   EKG:   The ekg ordered today demonstrates ***   Recent Labs: 01/17/2021: BUN 14; Creatinine, Ser 0.83; Hemoglobin 13.7; Platelets 209; Potassium 4.4; Sodium 135   Lipid Panel    Component Value Date/Time   CHOL 128 07/25/2014 0806   TRIG 94.0 07/25/2014 0806   HDL 38.70 (L) 07/25/2014 0806   CHOLHDL 3 07/25/2014 0806   VLDL 18.8 07/25/2014 0806   LDLCALC 71 07/25/2014 0806   LDLDIRECT 76.8 02/22/2013 0748     Other studies Reviewed: Additional studies/ records that were reviewed today with results demonstrating: ***.   ASSESSMENT AND PLAN:  CAD/Old MI:  Hyperlpidemia: LBBB: PVCs:   Current medicines are  reviewed at length with the patient today.  The patient concerns regarding his medicines were addressed.  The following changes have been made:  No change***  Labs/ tests ordered today include: *** No orders of the defined types were placed in this encounter.   Recommend 150 minutes/week of aerobic exercise Low fat, low carb, high fiber diet recommended  Disposition:   FU in ***   Signed, Larae Grooms, MD  03/11/2021 7:28 PM  [facility] [address], [place], [place]  [address] Phone: [phone]; Fax: [phone]

## 2021-03-12 DIAGNOSIS — R69 Illness, unspecified: Secondary | ICD-10-CM | POA: Diagnosis not present

## 2021-03-12 DIAGNOSIS — G609 Hereditary and idiopathic neuropathy, unspecified: Secondary | ICD-10-CM | POA: Diagnosis not present

## 2021-03-12 DIAGNOSIS — G4700 Insomnia, unspecified: Secondary | ICD-10-CM | POA: Diagnosis not present

## 2021-03-12 DIAGNOSIS — G894 Chronic pain syndrome: Secondary | ICD-10-CM | POA: Diagnosis not present

## 2021-03-13 ENCOUNTER — Ambulatory Visit: Payer: Medicare HMO | Admitting: Interventional Cardiology

## 2021-03-13 DIAGNOSIS — I25118 Atherosclerotic heart disease of native coronary artery with other forms of angina pectoris: Secondary | ICD-10-CM

## 2021-03-13 DIAGNOSIS — G4733 Obstructive sleep apnea (adult) (pediatric): Secondary | ICD-10-CM

## 2021-03-13 DIAGNOSIS — E782 Mixed hyperlipidemia: Secondary | ICD-10-CM

## 2021-03-13 DIAGNOSIS — I447 Left bundle-branch block, unspecified: Secondary | ICD-10-CM

## 2021-03-13 DIAGNOSIS — I10 Essential (primary) hypertension: Secondary | ICD-10-CM

## 2021-03-13 DIAGNOSIS — I252 Old myocardial infarction: Secondary | ICD-10-CM

## 2021-03-14 ENCOUNTER — Encounter: Payer: Self-pay | Admitting: Interventional Cardiology

## 2021-03-14 ENCOUNTER — Other Ambulatory Visit: Payer: Self-pay | Admitting: Interventional Cardiology

## 2021-03-14 ENCOUNTER — Other Ambulatory Visit: Payer: Self-pay

## 2021-03-14 ENCOUNTER — Ambulatory Visit: Payer: Medicare HMO | Admitting: Interventional Cardiology

## 2021-03-14 VITALS — BP 126/80 | HR 58 | Ht 70.0 in | Wt 231.0 lb

## 2021-03-14 DIAGNOSIS — R7303 Prediabetes: Secondary | ICD-10-CM

## 2021-03-14 DIAGNOSIS — E782 Mixed hyperlipidemia: Secondary | ICD-10-CM | POA: Diagnosis not present

## 2021-03-14 DIAGNOSIS — I252 Old myocardial infarction: Secondary | ICD-10-CM | POA: Diagnosis not present

## 2021-03-14 DIAGNOSIS — I25118 Atherosclerotic heart disease of native coronary artery with other forms of angina pectoris: Secondary | ICD-10-CM

## 2021-03-14 DIAGNOSIS — G4733 Obstructive sleep apnea (adult) (pediatric): Secondary | ICD-10-CM | POA: Diagnosis not present

## 2021-03-14 DIAGNOSIS — I447 Left bundle-branch block, unspecified: Secondary | ICD-10-CM

## 2021-03-14 DIAGNOSIS — I10 Essential (primary) hypertension: Secondary | ICD-10-CM | POA: Diagnosis not present

## 2021-03-14 MED ORDER — LISINOPRIL 5 MG PO TABS: 5.0000 mg | ORAL_TABLET | Freq: Every day | ORAL | 3 refills | Status: DC

## 2021-03-14 MED ORDER — NITROGLYCERIN 0.4 MG SL SUBL: SUBLINGUAL_TABLET | SUBLINGUAL | 3 refills | Status: DC

## 2021-03-14 MED ORDER — METOPROLOL SUCCINATE ER 25 MG PO TB24: ORAL_TABLET | ORAL | 3 refills | Status: DC

## 2021-03-14 MED ORDER — CLOPIDOGREL BISULFATE 75 MG PO TABS: 75.0000 mg | ORAL_TABLET | Freq: Every day | ORAL | 3 refills | Status: DC

## 2021-03-14 MED ORDER — ROSUVASTATIN CALCIUM 40 MG PO TABS: ORAL_TABLET | ORAL | 3 refills | Status: DC

## 2021-03-14 NOTE — Addendum Note (Signed)
Addended by: Thompson Grayer on: 03/14/2021 04:17 PM ? ? Modules accepted: Orders ? ?

## 2021-03-14 NOTE — Addendum Note (Signed)
Addended by: Thompson Grayer on: 03/14/2021 04:21 PM ? ? Modules accepted: Orders ? ?

## 2021-03-14 NOTE — Patient Instructions (Signed)
Medication Instructions:  Your physician recommends that you continue on your current medications as directed. Please refer to the Current Medication list given to you today.  *If you need a refill on your cardiac medications before your next appointment, please call your pharmacy*   Lab Work: none If you have labs (blood work) drawn today and your tests are completely normal, you will receive your results only by: MyChart Message (if you have MyChart) OR A paper copy in the mail If you have any lab test that is abnormal or we need to change your treatment, we will call you to review the results.   Testing/Procedures: none   Follow-Up: At [facility], you and your health needs are our priority.  As part of our continuing mission to provide you with exceptional heart care, we have created designated Provider Care Teams.  These Care Teams include your primary Cardiologist (physician) and Advanced Practice Providers (APPs -  Physician Assistants and Nurse Practitioners) who all work together to provide you with the care you need, when you need it.  We recommend signing up for the patient portal called "MyChart".  Sign up information is provided on this After Visit Summary.  MyChart is used to connect with patients for Virtual Visits (Telemedicine).  Patients are able to view lab/test results, encounter notes, upcoming appointments, etc.  Non-urgent messages can be sent to your provider as well.   To learn more about what you can do with MyChart, go to [email or web address].    Your next appointment:   12 month(s)  The format for your next appointment:   In Person  Provider:   Jayadeep Varanasi, MD     Other Instructions  High-Fiber Eating Plan Fiber, also called dietary fiber, is a type of carbohydrate. It is found foods such as fruits, vegetables, whole grains, and beans. A high-fiber diet can have many health benefits. Your health care provider may recommend a high-fiber diet  to help: Prevent constipation. Fiber can make your bowel movements more regular. Lower your cholesterol. Relieve the following conditions: Inflammation of veins in the anus (hemorrhoids). Inflammation of specific areas of the digestive tract (uncomplicated diverticulosis). A problem of the large intestine, also called the colon, that sometimes causes pain and diarrhea (irritable bowel syndrome, or IBS). Prevent overeating as part of a weight-loss plan. Prevent heart disease, type 2 diabetes, and certain cancers. What are tips for following this plan? Reading food labels  Check the nutrition facts label on food products for the amount of dietary fiber. Choose foods that have 5 grams of fiber or more per serving. The goals for recommended daily fiber intake include: Men (age 50 or younger): 34-38 g. Men (over age 50): 28-34 g. Women (age 50 or younger): 25-28 g. Women (over age 50): 22-25 g. Your daily fiber goal is _____________ g. Shopping Choose whole fruits and vegetables instead of processed forms, such as apple juice or applesauce. Choose a wide variety of high-fiber foods such as avocados, lentils, oats, and kidney beans. Read the nutrition facts label of the foods you choose. Be aware of foods with added fiber. These foods often have high sugar and sodium amounts per serving. Cooking Use whole-grain flour for baking and cooking. Cook with brown rice instead of white rice. Meal planning Start the day with a breakfast that is high in fiber, such as a cereal that contains 5 g of fiber or more per serving. Eat breads and cereals that are made with whole-grain flour instead of   refined flour or white flour. Eat brown rice, bulgur wheat, or millet instead of white rice. Use beans in place of meat in soups, salads, and pasta dishes. Be sure that half of the grains you eat each day are whole grains. General information You can get the recommended daily intake of dietary fiber  by: Eating a variety of fruits, vegetables, grains, nuts, and beans. Taking a fiber supplement if you are not able to take in enough fiber in your diet. It is better to get fiber through food than from a supplement. Gradually increase how much fiber you consume. If you increase your intake of dietary fiber too quickly, you may have bloating, cramping, or gas. Drink plenty of water to help you digest fiber. Choose high-fiber snacks, such as berries, raw vegetables, nuts, and popcorn. What foods should I eat? Fruits Berries. Pears. Apples. Oranges. Avocado. Prunes and raisins. Dried figs. Vegetables Sweet potatoes. Spinach. Kale. Artichokes. Cabbage. Broccoli. Cauliflower. Green peas. Carrots. Squash. Grains Whole-grain breads. Multigrain cereal. Oats and oatmeal. Brown rice. Barley. Bulgur wheat. Millet. Quinoa. Bran muffins. Popcorn. Rye wafer crackers. Meats and other proteins Navy beans, kidney beans, and pinto beans. Soybeans. Split peas. Lentils. Nuts and seeds. Dairy Fiber-fortified yogurt. Beverages Fiber-fortified soy milk. Fiber-fortified orange juice. Other foods Fiber bars. The items listed above may not be a complete list of recommended foods and beverages. Contact a dietitian for more information. What foods should I avoid? Fruits Fruit juice. Cooked, strained fruit. Vegetables Fried potatoes. Canned vegetables. Well-cooked vegetables. Grains White bread. Pasta made with refined flour. White rice. Meats and other proteins Fatty cuts of meat. Fried chicken or fried fish. Dairy Milk. Yogurt. Cream cheese. Sour cream. Fats and oils Butters. Beverages Soft drinks. Other foods Cakes and pastries. The items listed above may not be a complete list of foods and beverages to avoid. Talk with your dietitian about what choices are best for you. Summary Fiber is a type of carbohydrate. It is found in foods such as fruits, vegetables, whole grains, and beans. A high-fiber  diet has many benefits. It can help to prevent constipation, lower blood cholesterol, aid weight loss, and reduce your risk of heart disease, diabetes, and certain cancers. Increase your intake of fiber gradually. Increasing fiber too quickly may cause cramping, bloating, and gas. Drink plenty of water while you increase the amount of fiber you consume. The best sources of fiber include whole fruits and vegetables, whole grains, nuts, seeds, and beans. This information is not intended to replace advice given to you by your health care provider. Make sure you discuss any questions you have with your health care provider. Document Revised: 05/06/2019 Document Reviewed: 05/06/2019 Elsevier Patient Education  2022 Elsevier Inc.   

## 2021-03-14 NOTE — Progress Notes (Signed)
?  ?Cardiology Office Note ? ? ?Date:  03/14/2021  ? ?ID:  Wayne [name], DOB [birth year], MRN [id number] ? ?PCP:  Gaynelle Arabian, MD  ? ? ?Chief Complaint  ?Patient presents with  ? Follow-up  ? ?CAD ? ?Wt Readings from Last 3 Encounters:  ?03/14/21 231 lb (104.8 kg)  ?01/17/21 230 lb (104.3 kg)  ?11/06/20 230 lb (104.3 kg)  ?  ? ?  ?History of Present Illness: ?Wayne [name] is a 71 y.o. male    with coronary artery disease status post multivessel PCI in 2011, hyperlipidemia, left bundle branch block, sleep apnea. ?  ?He had hip surgery and knee surgery in 2019.  Both joints replaced.  ?  ?Has chronic back pain. He has had repeated cortisone injections.  ?  ?He has had a few injections in the left knee.  ?  ?He is planning on retiring  ? ?Had some SHOB in 2021 showing: ?"  with coronary artery disease status post multivessel PCI in 2011, hyperlipidemia, left bundle branch block, sleep apnea. ?  ?He had hip surgery and knee surgery in 2019.  Both joints replaced.  ?  ?Still has chronic back pain. He has had repeated cortisone injections.  ?  ?He is wearing a mask when he goes out. ?  ?The patient does not have symptoms concerning for COVID-19 infection (fever, chills, cough, or new shortness of breath).  ?  ?Since the last visit, he has had a few injections in the left knee.  ?  ?He is planning on retiring " ? ?Plan was stress test which showed : "No ischemia.  Normal EF.  If sx persist, would consider cath given that he had multiple stents several years ago, but no high risk features on this test. " ? ?Had [place] in 2/23.  Mild sx.  ? ?Denies : Chest pain. Dizziness. Leg edema. Nitroglycerin use. Orthopnea. Palpitations. Paroxysmal nocturnal dyspnea.  Syncope.   ? ?He is considering Inspire device for OSA.  Rare SHOB.  No change.   ? ? ?Past Medical History:  ?Diagnosis Date  ? Arthritis   ? Basal cell carcinoma   ? MOHS  ? Coronary artery disease 09/2009  ? S/P MI and PTCA--X 4 STENTS  ? Depression   ? situational  ?  Diverticulosis   ? Hip pain   ? CHRONIC  ? Hyperlipidemia   ? Hypertension   ? LBBB (left bundle branch block)   ? Myocardial infarction [facility])   ? Neuropathy   ? IDIOPATHIC  ? Neuropathy   ? Sleep apnea 11/22/2016  ? does not wear CPAP  ? ? ?Past Surgical History:  ?Procedure Laterality Date  ? CARDIAC CATHETERIZATION  09/11  ? CHOLECYSTECTOMY    ? DRUG INDUCED ENDOSCOPY N/A 01/17/2021  ? Procedure: DRUG INDUCED SLEEP ENDOSCOPY;  Surgeon: Jerrell Belfast, MD;  Location: [place];  Service: ENT;  Laterality: N/A;  ? JOINT REPLACEMENT  11/22/2016  ? right total knee 10/2016  ? LUMBAR LAMINECTOMY/DECOMPRESSION MICRODISCECTOMY Left 11/26/2016  ? Procedure: Laminectomy for facet/synovial cyst - left - Lumbar four-Lumbar five;  Surgeon: Earnie Larsson, MD;  Location: [place];  Service: Neurosurgery;  Laterality: Left;  ? RIGHT SHOULDER BONE TUMOR    ? SLIPPED CAPITAL FEMORAL EPIPHYSIS PINNING    ? ? ? ?Current Outpatient Medications  ?Medication Sig Dispense Refill  ? amoxicillin-clavulanate (AUGMENTIN) 875-125 MG tablet Take 1 tablet by mouth 2 (two) times daily.    ? aspirin EC 81 MG tablet  Take 1 tablet (81 mg total) by mouth daily.    ? clopidogrel (PLAVIX) 75 MG tablet Take 1 tablet (75 mg total) by mouth daily. 30 tablet 0  ? cyclobenzaprine (FLEXERIL) 10 MG tablet Take 1 tablet by mouth in the morning, at noon, and at bedtime.    ? diclofenac Sodium (VOLTAREN) 1 % GEL Apply 1 application topically daily as needed (pain).    ? docusate sodium (COLACE) 100 MG capsule Take 100 mg by mouth daily as needed for moderate constipation.    ? DULoxetine (CYMBALTA) 60 MG capsule Take 120 mg by mouth daily.    ? lisinopril (ZESTRIL) 5 MG tablet Take 1 tablet (5 mg total) by mouth daily. Please keep upcoming appt in February 2023 with Dr. Irish Lack before anymore refills. Thank you Final Attempt 30 tablet 0  ? metoprolol succinate (TOPROL-XL) 25 MG 24 hr tablet TAKE 1/2 TABLET BY MOUTH TWO TIMES A DAY 30 tablet 0  ? nitroGLYCERIN  (NITROSTAT) 0.4 MG SL tablet Dissolve one tablet under tongue every 5 minutes as needed for chest pain; up to 3 doses--call 911 if pain persist 25 tablet 3  ? Omega-3 Fatty Acids (FISH OIL) 1000 MG CAPS Take 2 capsules (2,000 mg total) by mouth daily.  0  ? oxyCODONE-acetaminophen (PERCOCET) 10-325 MG per tablet Take 1 tablet every 6 (six) hours as needed by mouth for pain.     ? pregabalin (LYRICA) 150 MG capsule Take 150 mg by mouth 2 (two) times daily.    ? Respiratory Therapy Supplies (CARETOUCH CPAP & BIPAP HOSE) MISC See admin instructions.    ? rosuvastatin (CRESTOR) 40 MG tablet TAKE ONE TABLET BY MOUTH EVERY EVENING *NEED APPOINTMENT FOR REFILLS* 30 tablet 0  ? zolpidem (AMBIEN) 10 MG tablet Take 10 mg by mouth at bedtime.  3  ? ?No current facility-administered medications for this visit.  ? ? ?Allergies:   Gabapentin, Mirapex [pramipexole dihydrochloride], and Trazodone and nefazodone  ? ? ?Social History:  The patient  reports that he has never smoked. He has never used smokeless tobacco. He reports that he does not drink alcohol and does not use drugs.  ? ?Family History:  The patient's family history includes Cancer in his mother; Lung cancer in his father.  ? ? ?ROS:  Please see the history of present illness.   Otherwise, review of systems are positive for fatigue with sinus infection.   All other systems are reviewed and negative.  ? ? ?PHYSICAL EXAM: ?VS:  BP 126/80   Pulse (!) 58   Ht 5\' 10"  (1.778 m)   Wt 231 lb (104.8 kg)   SpO2 98%   BMI 33.15 kg/m?  , BMI Body mass index is 33.15 kg/m?. ?GEN: Well nourished, well developed, in no acute distress ?HEENT: normal ?Neck: no JVD, carotid bruits, or masses ?Cardiac: RRR; no murmurs, rubs, or gallops,no edema  ?Respiratory:  clear to auscultation bilaterally, normal work of breathing ?GI: soft, nontender, nondistended, + BS ?MS: no deformity or atrophy ?Skin: warm and dry, no rash ?Neuro:  Strength and sensation are intact ?Psych: euthymic mood,  full affect ? ? ?EKG:   ?The ekg ordered January 2023 demonstrates normal sinus rhythm, left bundle branch block ? ? ?Recent Labs: ?01/17/2021: BUN 14; Creatinine, Ser 0.83; Hemoglobin 13.7; Platelets 209; Potassium 4.4; Sodium 135  ? ?Lipid Panel ?   ?Component Value Date/Time  ? CHOL 128 07/25/2014 0806  ? TRIG 94.0 07/25/2014 0806  ? HDL 38.70 (L) 07/25/2014  9163  ? CHOLHDL 3 07/25/2014 0806  ? VLDL 18.8 07/25/2014 0806  ? Bell 71 07/25/2014 0806  ? LDLDIRECT 76.8 02/22/2013 0748  ? ?  ?Other studies Reviewed: ?Additional studies/ records that were reviewed today with results demonstrating: Labs reviewed, LDL 57 in August 2022, A1c 5.9 in August 2022, creatinine 0.83 in January 2023. ? ? ?ASSESSMENT AND PLAN: ? ?CAD/Old MI: No angina. Continue aggressive secondary prevention.  Clopidogrel monotherapy.  No bleeding issues.  Exercising regularly.  Stress test in 2021 did not show any significant ischemia.  No symptoms like his prior MI. ?Hyperlpidemia: LDL 57, TG 167.  Continue high-dose rosuvastatin. ?LBBB: Chronic.  ?PVCs: No sx.  ?PreDM: 5.9%.  Continue regular exercise and healthy diet. ?OSA: Followed by Dr. Radford Pax.  Considering inspire device. ?Hypertension: The current medical regimen is effective;  continue present plan and medications. ? ? ? ?Current medicines are reviewed at length with the patient today.  The patient concerns regarding his medicines were addressed. ? ?The following changes have been made:  No change ? ?Labs/ tests ordered today include:  ?No orders of the defined types were placed in this encounter. ? ? ?Recommend 150 minutes/week of aerobic exercise ?Low fat, low carb, high fiber diet recommended ? ?Disposition:   FU in 1 year ? ? ?Signed, ?Larae Grooms, MD  ?03/14/2021 3:48 PM    ?[address] ?[place], [place], [place]  [address] ?Phone: [phone]; Fax: [phone]  ? ?

## 2021-03-17 ENCOUNTER — Other Ambulatory Visit: Payer: Self-pay | Admitting: Otolaryngology

## 2021-03-19 ENCOUNTER — Other Ambulatory Visit: Payer: Self-pay

## 2021-03-19 ENCOUNTER — Encounter (HOSPITAL_BASED_OUTPATIENT_CLINIC_OR_DEPARTMENT_OTHER): Payer: Self-pay | Admitting: Otolaryngology

## 2021-03-20 ENCOUNTER — Other Ambulatory Visit: Payer: Self-pay | Admitting: Otolaryngology

## 2021-03-21 DIAGNOSIS — G4733 Obstructive sleep apnea (adult) (pediatric): Secondary | ICD-10-CM | POA: Diagnosis not present

## 2021-03-23 ENCOUNTER — Ambulatory Visit (HOSPITAL_COMMUNITY): Payer: Medicare HMO | Admitting: Certified Registered"

## 2021-03-27 DIAGNOSIS — G4731 Primary central sleep apnea: Secondary | ICD-10-CM | POA: Diagnosis not present

## 2021-03-28 ENCOUNTER — Encounter: Payer: Self-pay | Admitting: Interventional Cardiology

## 2021-03-28 ENCOUNTER — Other Ambulatory Visit: Payer: Self-pay

## 2021-03-28 ENCOUNTER — Encounter (HOSPITAL_BASED_OUTPATIENT_CLINIC_OR_DEPARTMENT_OTHER)
Admission: RE | Disposition: A | Discharge status: Home / Self Care | Payer: Self-pay | Source: Home / Self Care | Attending: Otolaryngology

## 2021-03-28 ENCOUNTER — Encounter (HOSPITAL_BASED_OUTPATIENT_CLINIC_OR_DEPARTMENT_OTHER): Payer: Self-pay | Admitting: Otolaryngology

## 2021-03-28 ENCOUNTER — Ambulatory Visit (HOSPITAL_BASED_OUTPATIENT_CLINIC_OR_DEPARTMENT_OTHER)
Admission: RE | Admit: 2021-03-28 | Discharge: 2021-03-28 | Disposition: A | Discharge status: Home / Self Care | Payer: Medicare HMO | Attending: Otolaryngology | Admitting: Otolaryngology

## 2021-03-28 DIAGNOSIS — G4731 Primary central sleep apnea: Secondary | ICD-10-CM | POA: Diagnosis not present

## 2021-03-28 DIAGNOSIS — G4733 Obstructive sleep apnea (adult) (pediatric): Secondary | ICD-10-CM | POA: Diagnosis not present

## 2021-03-28 SURGERY — CANCELLED PROCEDURE
Anesthesia: General | Laterality: Right

## 2021-03-28 MED ORDER — PHENYLEPHRINE 40 MCG/ML (10ML) SYRINGE FOR IV PUSH (FOR BLOOD PRESSURE SUPPORT)
PREFILLED_SYRINGE | INTRAVENOUS | Status: AC
  Filled 2021-03-28: qty 10

## 2021-03-28 MED ORDER — ONDANSETRON HCL 4 MG/2ML IJ SOLN
INTRAMUSCULAR | Status: AC
  Filled 2021-03-28: qty 4

## 2021-03-28 MED ORDER — FENTANYL CITRATE (PF) 100 MCG/2ML IJ SOLN
INTRAMUSCULAR | Status: AC
  Filled 2021-03-28: qty 2

## 2021-03-28 MED ORDER — LIDOCAINE-EPINEPHRINE 1 %-1:100000 IJ SOLN
INTRAMUSCULAR | Status: AC
  Filled 2021-03-28: qty 1

## 2021-03-28 MED ORDER — LACTATED RINGERS IV SOLN: INTRAVENOUS | Status: DC

## 2021-03-28 MED ORDER — EPHEDRINE 5 MG/ML INJ
INTRAVENOUS | Status: AC
  Filled 2021-03-28: qty 5

## 2021-03-28 MED ORDER — DEXAMETHASONE SODIUM PHOSPHATE 10 MG/ML IJ SOLN
INTRAMUSCULAR | Status: AC
  Filled 2021-03-28: qty 1

## 2021-03-28 MED ORDER — SUCCINYLCHOLINE CHLORIDE 200 MG/10ML IV SOSY
PREFILLED_SYRINGE | INTRAVENOUS | Status: AC
  Filled 2021-03-28: qty 10

## 2021-03-28 MED ORDER — DEXMEDETOMIDINE (PRECEDEX) IN NS 20 MCG/5ML (4 MCG/ML) IV SYRINGE
PREFILLED_SYRINGE | INTRAVENOUS | Status: AC
  Filled 2021-03-28: qty 15

## 2021-03-28 MED ORDER — PROPOFOL 500 MG/50ML IV EMUL
INTRAVENOUS | Status: AC
  Filled 2021-03-28: qty 100

## 2021-03-28 MED ORDER — CEFAZOLIN SODIUM-DEXTROSE 2-4 GM/100ML-% IV SOLN
INTRAVENOUS | Status: AC
  Filled 2021-03-28: qty 100

## 2021-03-28 MED ORDER — CEFAZOLIN SODIUM-DEXTROSE 2-4 GM/100ML-% IV SOLN: 2.0000 g | INTRAVENOUS | Status: DC

## 2021-03-28 MED ORDER — LIDOCAINE 2% (20 MG/ML) 5 ML SYRINGE
INTRAMUSCULAR | Status: AC
  Filled 2021-03-28: qty 10

## 2021-03-28 NOTE — Anesthesia Preprocedure Evaluation (Signed)
Anesthesia Evaluation  ? ? ?Reviewed: ?Allergy & Precautions, Patient's Chart, lab work & pertinent test results ? ?Airway ? ? ? ? ? ? ? Dental ?  ?Pulmonary ?neg pulmonary ROS,  ?  ? ? ? ? ? ? ? Cardiovascular ?hypertension, Pt. on medications and Pt. on home beta blockers ?+ CAD, + Past MI and + Cardiac Stents  ? ? ? ?  ?Neuro/Psych ?negative neurological ROS ?   ? GI/Hepatic ?negative GI ROS, Neg liver ROS,   ?Endo/Other  ?negative endocrine ROS ? Renal/GU ?negative Renal ROS  ? ?  ?Musculoskeletal ?negative musculoskeletal ROS ?(+)  ? Abdominal ?  ?Peds ? Hematology ?negative hematology ROS ?(+)   ?Anesthesia Other Findings ?Obstructive sleep apnea ? Reproductive/Obstetrics ? ?  ? ? ? ? ? ? ? ? ? ? ? ? ? ?  ?  ? ? ? ? ? ? ? ? ?Anesthesia Physical ?Anesthesia Plan ?Anesthesia Quick Evaluation ? ?

## 2021-03-28 NOTE — Telephone Encounter (Signed)
Sent message to Dr Victorio Palm office asking for them to send [place] a surgical clearance form to appropriately document holding plavix. Of note, pt currently admitted at surgical center with Dr Wilburn Cornelia ?

## 2021-03-28 NOTE — Progress Notes (Signed)
Pt here for hypoglossal nerve stimulator implant.  Pt states he took all morning medications including aspirin '81mg'$  and plavix.  Dr. Wilburn Cornelia notified and decision to cancel surgery today.  Dr. Wilburn Cornelia spoke with patient and advised that his office would be in contact to reschedule. ?

## 2021-04-01 IMAGING — CR DG WRIST COMPLETE 3+V*R*
4 series · 4 of 4 positions shown · non-contrast
Comparison: None.

CLINICAL DATA: Chronic right wrist pain without known injury.

EXAM:
RIGHT WRIST - COMPLETE 3+ VIEW

[x wrist pa right]
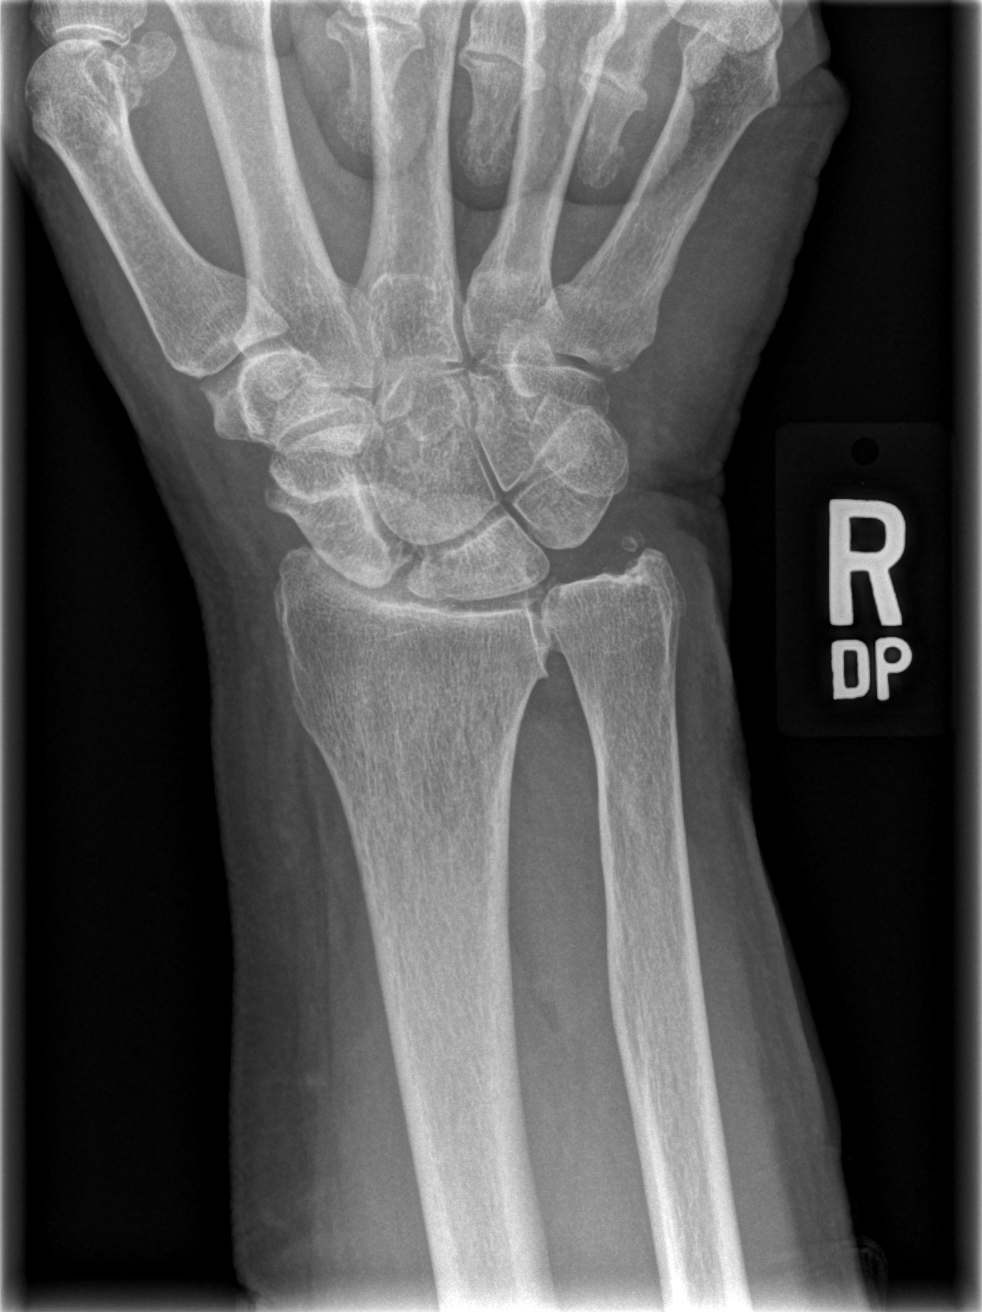

[x wrist obl right]
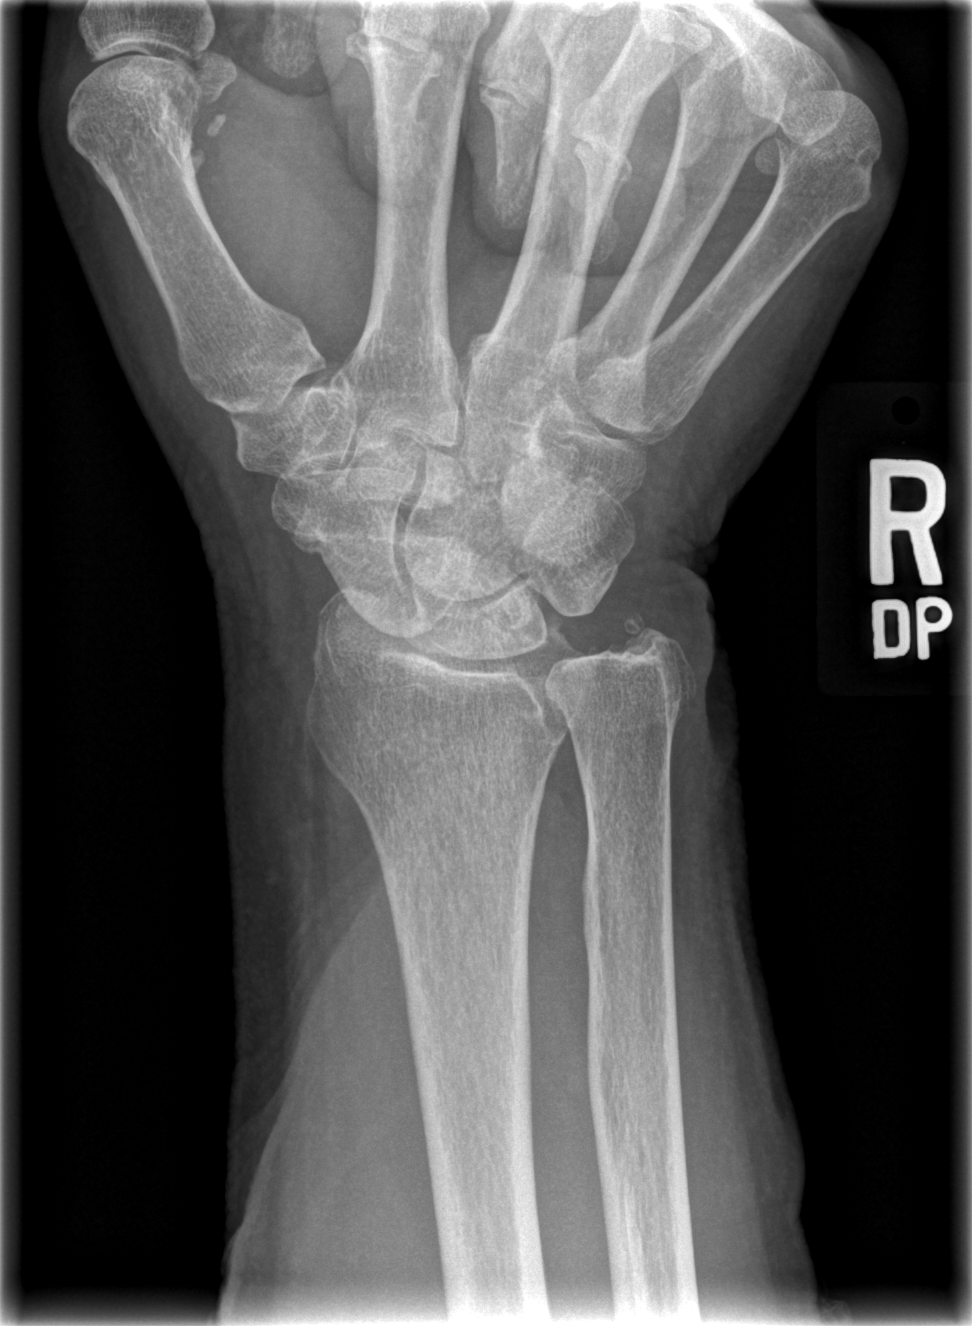

[x wrist lat right]
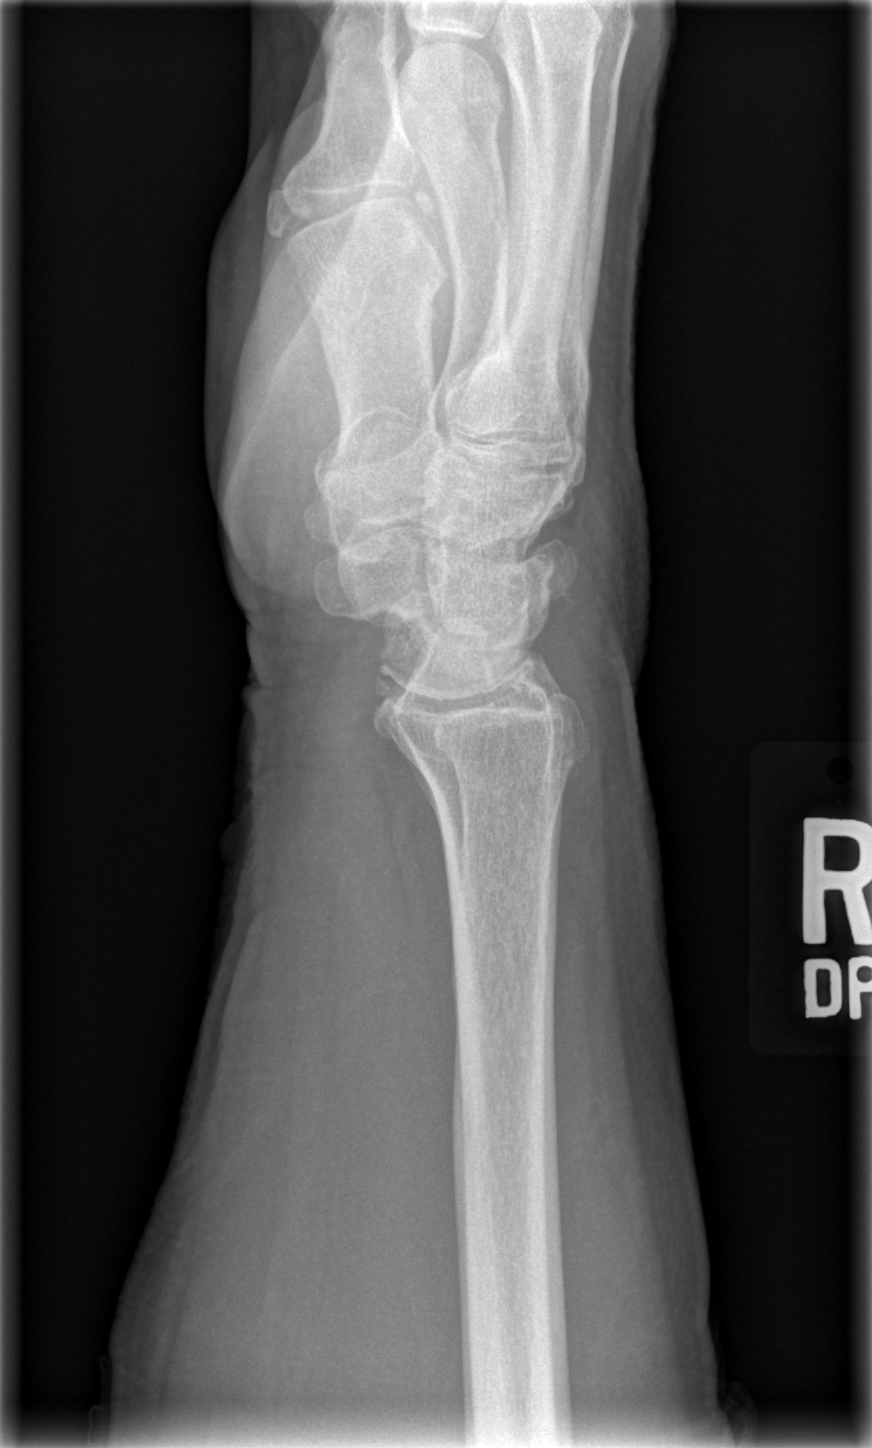

[x navicular]
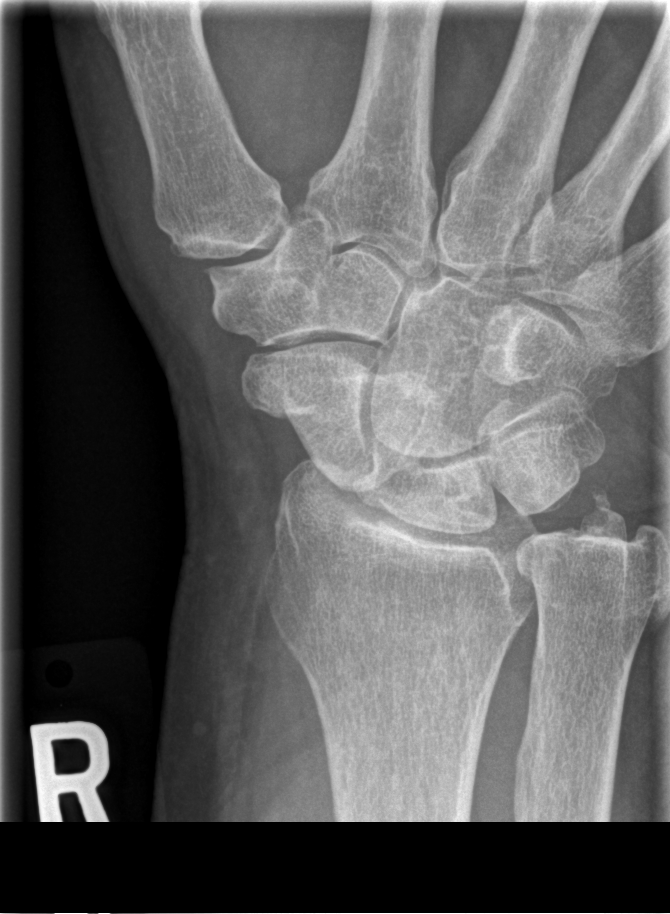

[4 of 4 positions shown; findings below may reference images not displayed]

FINDINGS: There is no evidence of fracture or dislocation. Moderate narrowing
of radiocarpal joint is noted. No soft tissue abnormality is noted.
IMPRESSION: Moderate degenerative joint disease is noted at the radiocarpal
joint. No acute abnormality seen in the right wrist.

## 2021-04-03 ENCOUNTER — Telehealth: Payer: Self-pay | Admitting: Interventional Cardiology

## 2021-04-03 ENCOUNTER — Other Ambulatory Visit: Payer: Self-pay | Admitting: Otolaryngology

## 2021-04-03 NOTE — Telephone Encounter (Signed)
? ?  Pre-operative Risk Assessment  ?  ?Patient Name: Wayne [name]  ?DOB: 10-17-1950 ?MRN: [id number]  ? ?  ? ?Request for Surgical Clearance   ? ?Procedure:   Implantation of hypoglossal nerve stimulator ? ?Date of Surgery:  Clearance 04/13/21                              ?   ?Surgeon:  Dr. Wilburn Cornelia  ?Surgeon's Group or Practice Name:  [facility] ?Phone number:  [phone] [facility] ?Fax number:  [phone] ?  ?Type of Clearance Requested:   ?- Medical  ?- Pharmacy:  Hold Aspirin and Clopidogrel (Plavix) How long to hold Plavix and Aspirin.  ?  ?Type of Anesthesia:  General  ?  ?Additional requests/questions:     ? ?Signed, ?Ermelinda Das   ?04/03/2021, 9:56 AM   ?

## 2021-04-03 NOTE — Telephone Encounter (Addendum)
Dr. Irish Lack ?You saw this patient on 3/123. We are asked for clearance for hypoglossal nerve stimulator with ASA and plavix hold. Per your recent note, he should be on plavix monotherapy.  ? ?Hx of CAD with nonischemic nuclear stress test 09/2019. ? ?Can you please comment on clearance and plavix hold? ?

## 2021-04-09 NOTE — Telephone Encounter (Signed)
No need for aspirin pst surgery. Can start plavix monotherapy.  ? ?JV  ? ?Patient notified.  ?

## 2021-04-09 NOTE — Addendum Note (Signed)
Addended by: Thompson Grayer on: 04/09/2021 12:31 PM ? ? Modules accepted: Orders ? ?

## 2021-04-09 NOTE — Telephone Encounter (Signed)
? ?  Patient Name: Wayne [name]  ?DOB: November 12, 1950 ?MRN: [id number] ? ?Primary Cardiologist: Larae Grooms, MD ? ?Chart reviewed as part of pre-operative protocol coverage. Patient had recent OV with Dr. Irish Lack, doing well at that visit - I reached out to patient for update on how he is doing and to clarify whether he is on ASA in addition to his Plavix. The patient affirms he has been doing well without any new cardiac symptoms. Therefore, based on ACC/AHA guidelines, the patient would be at acceptable risk for the planned procedure without further cardiovascular testing. The patient was advised that if he develops new symptoms prior to surgery to contact our office to arrange for a follow-up visit, and he verbalized understanding. ? ?He does report he is taking ASA '81mg'$  in addition to clopidogrel/Plavix '75mg'$  daily and that he has begun holding both as of today in preparation for his surgery. Dr. Irish Lack did give clearance to hold his Plavix for 5 days prior to surgery. As the intention was for him to be on Plavix monotherapy by last note, therefore OK to hold ASA as well. However, we need clarification from Dr. Irish Lack on whether he should be taking the aspirin post surgery - this was on med list at 03/14/21 OV but note's plan indicated to continue Plavix monotherapy. Will route to Dr. Irish Lack and his nurse to review/advise on the question of whether he should be taking baby ASA (as part of his medication regimen, not specifically related to preop) and call patient with further instructions. ? ?Otherwise, preop is done with chart. Will route this bundled recommendation to requesting provider via Epic fax function. Please call with questions. ? ? ?Charlie Pitter, PA-C ?04/09/2021, 10:17 AM ? ? ? ? ?

## 2021-04-12 ENCOUNTER — Encounter (HOSPITAL_COMMUNITY): Payer: Self-pay | Admitting: Otolaryngology

## 2021-04-12 ENCOUNTER — Other Ambulatory Visit: Payer: Self-pay

## 2021-04-12 NOTE — Anesthesia Preprocedure Evaluation (Addendum)
Anesthesia Evaluation  ?Patient identified by MRN, date of birth, ID band ?Patient awake ? ? ? ?Reviewed: ?Allergy & Precautions, NPO status , Patient's Chart, lab work & pertinent test results, reviewed documented beta blocker date and time  ? ?History of Anesthesia Complications ?Negative for: history of anesthetic complications ? ?Airway ?Mallampati: II ? ?TM Distance: >3 FB ?Neck ROM: Full ? ? ? Dental ? ?(+) Dental Advisory Given ?  ?Pulmonary ?sleep apnea (uses BiPAP) , former smoker,  ?  ?breath sounds clear to auscultation ? ? ? ? ? ? Cardiovascular ?hypertension, Pt. on medications and Pt. on home beta blockers ?(-) angina+ CAD, + Past MI and + Cardiac Stents  ? ?Rhythm:Regular Rate:Normal ? ?'21 Stress: no ischemia ?  ?Neuro/Psych ?Depression negative neurological ROS ?   ? GI/Hepatic ?negative GI ROS, Neg liver ROS,   ?Endo/Other  ?obese ? Renal/GU ?negative Renal ROS  ? ?  ?Musculoskeletal ? ? Abdominal ?(+) + obese,   ?Peds ? Hematology ?negative hematology ROS ?(+)   ?Anesthesia Other Findings ? ? Reproductive/Obstetrics ? ?  ? ? ? ? ? ? ? ? ? ? ? ? ? ?  ?  ? ? ? ? ? ? ? ?Anesthesia Physical ?Anesthesia Plan ? ?ASA: 3 ? ?Anesthesia Plan: General  ? ?Post-op Pain Management: Tylenol PO (pre-op)*  ? ?Induction: Intravenous ? ?PONV Risk Score and Plan: 2 and Ondansetron and Dexamethasone ? ?Airway Management Planned: Oral ETT ? ?Additional Equipment: None ? ?Intra-op Plan:  ? ?Post-operative Plan: Extubation in OR ? ?Informed Consent: I have reviewed the patients History and Physical, chart, labs and discussed the procedure including the risks, benefits and alternatives for the proposed anesthesia with the patient or authorized representative who has indicated his/her understanding and acceptance.  ? ? ? ?Dental advisory given ? ?Plan Discussed with: CRNA and Surgeon ? ?Anesthesia Plan Comments:   ? ? ? ? ? ?Anesthesia Quick Evaluation ? ?

## 2021-04-12 NOTE — Progress Notes (Signed)
Mr. Wayne [name] denies chest pain or shortness of breath. Patient denies having any s/s of Covid in his household.  Patient denies any known exposure to Covid.  ? ?Mr. Wayne [name]'s Drs.: ? ?PCP:  Dr. Ara Kussmaul ?Cardiologist : Dr. Lendell Caprice.  ?Dr. Irish Lack cleared patient for surgery, gave permission to hold Plavix and instructed patient to stop ASA and no need to restart it. ? ?I instructed Mr. Wayne [name] to shower with antibacteria soap.   No nail polish, artificial or acrylic nails. Wear clean clothes, brush your teeth. ?Glasses, contact lens,dentures or partials may not be worn in the OR. If you need to wear them, please bring a case for glasses, do not wear contacts or bring a case, the hospital does not have contact cases, dentures or partials will have to be removed , make sure they are clean, we will provide a denture cup to put them in. You will need some one to drive you home and a responsible person over the age of 39 to stay with you for the first 24 hours after surgery.  ?

## 2021-04-13 ENCOUNTER — Ambulatory Visit (HOSPITAL_COMMUNITY): Payer: Medicare HMO

## 2021-04-13 ENCOUNTER — Encounter (HOSPITAL_COMMUNITY): Payer: Self-pay | Admitting: Otolaryngology

## 2021-04-13 ENCOUNTER — Other Ambulatory Visit: Payer: Self-pay

## 2021-04-13 ENCOUNTER — Ambulatory Visit (HOSPITAL_COMMUNITY): Payer: Medicare HMO | Admitting: Anesthesiology

## 2021-04-13 ENCOUNTER — Encounter (HOSPITAL_COMMUNITY)
Admission: RE | Disposition: A | Discharge status: Home / Self Care | Payer: Self-pay | Source: Home / Self Care | Attending: Otolaryngology

## 2021-04-13 ENCOUNTER — Ambulatory Visit (HOSPITAL_COMMUNITY)
Admission: RE | Admit: 2021-04-13 | Discharge: 2021-04-13 | Disposition: A | Discharge status: Home / Self Care | Payer: Medicare HMO | Attending: Otolaryngology | Admitting: Otolaryngology

## 2021-04-13 ENCOUNTER — Ambulatory Visit (HOSPITAL_BASED_OUTPATIENT_CLINIC_OR_DEPARTMENT_OTHER): Payer: Medicare HMO | Admitting: Anesthesiology

## 2021-04-13 DIAGNOSIS — Z9989 Dependence on other enabling machines and devices: Secondary | ICD-10-CM

## 2021-04-13 DIAGNOSIS — I252 Old myocardial infarction: Secondary | ICD-10-CM | POA: Diagnosis not present

## 2021-04-13 DIAGNOSIS — I2510 Atherosclerotic heart disease of native coronary artery without angina pectoris: Secondary | ICD-10-CM | POA: Diagnosis not present

## 2021-04-13 DIAGNOSIS — Z79899 Other long term (current) drug therapy: Secondary | ICD-10-CM | POA: Diagnosis not present

## 2021-04-13 DIAGNOSIS — F32A Depression, unspecified: Secondary | ICD-10-CM | POA: Diagnosis not present

## 2021-04-13 DIAGNOSIS — Z955 Presence of coronary angioplasty implant and graft: Secondary | ICD-10-CM | POA: Insufficient documentation

## 2021-04-13 DIAGNOSIS — I10 Essential (primary) hypertension: Secondary | ICD-10-CM | POA: Insufficient documentation

## 2021-04-13 DIAGNOSIS — Z87891 Personal history of nicotine dependence: Secondary | ICD-10-CM | POA: Insufficient documentation

## 2021-04-13 DIAGNOSIS — G4730 Sleep apnea, unspecified: Secondary | ICD-10-CM | POA: Diagnosis not present

## 2021-04-13 DIAGNOSIS — R69 Illness, unspecified: Secondary | ICD-10-CM | POA: Diagnosis not present

## 2021-04-13 DIAGNOSIS — E669 Obesity, unspecified: Secondary | ICD-10-CM | POA: Insufficient documentation

## 2021-04-13 DIAGNOSIS — G4733 Obstructive sleep apnea (adult) (pediatric): Secondary | ICD-10-CM | POA: Diagnosis not present

## 2021-04-13 DIAGNOSIS — Z6832 Body mass index (BMI) 32.0-32.9, adult: Secondary | ICD-10-CM | POA: Diagnosis not present

## 2021-04-13 HISTORY — PX: IMPLANTATION OF HYPOGLOSSAL NERVE STIMULATOR: SHX6827

## 2021-04-13 LAB — CBC
HCT: 36.9 % — ABNORMAL LOW (ref 39.0–52.0)
Hemoglobin: 12.4 g/dL — ABNORMAL LOW (ref 13.0–17.0)
MCH: 30.9 pg (ref 26.0–34.0)
MCHC: 33.6 g/dL (ref 30.0–36.0)
MCV: 92 fL (ref 80.0–100.0)
Platelets: 186 10*3/uL (ref 150–400)
RBC: 4.01 MIL/uL — ABNORMAL LOW (ref 4.22–5.81)
RDW: 12.6 % (ref 11.5–15.5)
WBC: 6.8 10*3/uL (ref 4.0–10.5)
nRBC: 0 % (ref 0.0–0.2)

## 2021-04-13 LAB — BASIC METABOLIC PANEL
Anion gap: 5 (ref 5–15)
BUN: 17 mg/dL (ref 8–23)
CO2: 27 mmol/L (ref 22–32)
Calcium: 8.8 mg/dL — ABNORMAL LOW (ref 8.9–10.3)
Chloride: 102 mmol/L (ref 98–111)
Creatinine, Ser: 0.76 mg/dL (ref 0.61–1.24)
GFR, Estimated: >60 mL/min (ref >60)
Glucose, Bld: 112 mg/dL — ABNORMAL HIGH (ref 70–99)
Potassium: 4 mmol/L (ref 3.5–5.1)
Sodium: 134 mmol/L — ABNORMAL LOW (ref 135–145)

## 2021-04-13 SURGERY — INSERTION, HYPOGLOSSAL NERVE STIMULATOR
Anesthesia: General | Site: Chest | Laterality: Right

## 2021-04-13 MED ORDER — PROPOFOL 10 MG/ML IV BOLUS
INTRAVENOUS | Status: AC
  Filled 2021-04-13: qty 20

## 2021-04-13 MED ORDER — MIDAZOLAM HCL 2 MG/2ML IJ SOLN
INTRAMUSCULAR | Status: DC | PRN
  Administered 2021-04-13: 2 mg via INTRAVENOUS

## 2021-04-13 MED ORDER — HYDROCODONE-ACETAMINOPHEN 5-325 MG PO TABS: ORAL_TABLET | ORAL | 0 refills | Status: DC

## 2021-04-13 MED ORDER — DEXAMETHASONE SODIUM PHOSPHATE 10 MG/ML IJ SOLN
INTRAMUSCULAR | Status: DC | PRN
  Administered 2021-04-13: 10 mg via INTRAVENOUS

## 2021-04-13 MED ORDER — CEFAZOLIN SODIUM-DEXTROSE 2-4 GM/100ML-% IV SOLN
2.0000 g | INTRAVENOUS | Status: AC
  Administered 2021-04-13: 2 g via INTRAVENOUS
  Filled 2021-04-13: qty 100

## 2021-04-13 MED ORDER — ONDANSETRON HCL 4 MG/2ML IJ SOLN
INTRAMUSCULAR | Status: DC | PRN
  Administered 2021-04-13: 4 mg via INTRAVENOUS

## 2021-04-13 MED ORDER — PHENYLEPHRINE HCL-NACL 20-0.9 MG/250ML-% IV SOLN
INTRAVENOUS | Status: DC | PRN
  Administered 2021-04-13: 25 ug/min via INTRAVENOUS

## 2021-04-13 MED ORDER — LACTATED RINGERS IV SOLN: INTRAVENOUS | Status: DC | PRN

## 2021-04-13 MED ORDER — LIDOCAINE-EPINEPHRINE 1 %-1:100000 IJ SOLN
INTRAMUSCULAR | Status: DC | PRN
  Administered 2021-04-13: 4 mL

## 2021-04-13 MED ORDER — DEXAMETHASONE SODIUM PHOSPHATE 10 MG/ML IJ SOLN
INTRAMUSCULAR | Status: AC
  Filled 2021-04-13: qty 1

## 2021-04-13 MED ORDER — PROPOFOL 10 MG/ML IV BOLUS
INTRAVENOUS | Status: DC | PRN
  Administered 2021-04-13: 150 mg via INTRAVENOUS

## 2021-04-13 MED ORDER — ACETAMINOPHEN 500 MG PO TABS
1000.0000 mg | ORAL_TABLET | Freq: Once | ORAL | Status: AC
  Administered 2021-04-13: 1000 mg via ORAL
  Filled 2021-04-13: qty 2

## 2021-04-13 MED ORDER — LIDOCAINE HCL (CARDIAC) PF 100 MG/5ML IV SOSY
PREFILLED_SYRINGE | INTRAVENOUS | Status: DC | PRN
  Administered 2021-04-13: 20 mg via INTRAVENOUS

## 2021-04-13 MED ORDER — CHLORHEXIDINE GLUCONATE 0.12 % MT SOLN
15.0000 mL | Freq: Once | OROMUCOSAL | Status: AC
  Administered 2021-04-13: 15 mL via OROMUCOSAL
  Filled 2021-04-13: qty 15

## 2021-04-13 MED ORDER — MIDAZOLAM HCL 2 MG/2ML IJ SOLN
INTRAMUSCULAR | Status: AC
  Filled 2021-04-13: qty 2

## 2021-04-13 MED ORDER — FENTANYL CITRATE (PF) 250 MCG/5ML IJ SOLN
INTRAMUSCULAR | Status: DC | PRN
  Administered 2021-04-13: 50 ug via INTRAVENOUS
  Administered 2021-04-13 (×2): 100 ug via INTRAVENOUS

## 2021-04-13 MED ORDER — SUCCINYLCHOLINE CHLORIDE 200 MG/10ML IV SOSY
PREFILLED_SYRINGE | INTRAVENOUS | Status: DC | PRN
  Administered 2021-04-13: 140 mg via INTRAVENOUS

## 2021-04-13 MED ORDER — LACTATED RINGERS IV SOLN: INTRAVENOUS | Status: DC

## 2021-04-13 MED ORDER — FENTANYL CITRATE (PF) 250 MCG/5ML IJ SOLN
INTRAMUSCULAR | Status: AC
  Filled 2021-04-13: qty 5

## 2021-04-13 MED ORDER — ONDANSETRON HCL 4 MG/2ML IJ SOLN
INTRAMUSCULAR | Status: AC
  Filled 2021-04-13: qty 2

## 2021-04-13 MED ORDER — LIDOCAINE-EPINEPHRINE 1 %-1:100000 IJ SOLN
INTRAMUSCULAR | Status: AC
  Filled 2021-04-13: qty 1

## 2021-04-13 MED ORDER — 0.9 % SODIUM CHLORIDE (POUR BTL) OPTIME
TOPICAL | Status: DC | PRN
  Administered 2021-04-13: 1000 mL

## 2021-04-13 MED ORDER — ORAL CARE MOUTH RINSE: 15.0000 mL | Freq: Once | OROMUCOSAL | Status: AC

## 2021-04-13 SURGICAL SUPPLY — 60 items
BAG COUNTER SPONGE SURGICOUNT (BAG) ×2 IMPLANT
BAG DECANTER FOR FLEXI CONT (MISCELLANEOUS) ×2 IMPLANT
BLADE CLIPPER SURG (BLADE) ×1 IMPLANT
CANISTER SUCT 3000ML PPV (MISCELLANEOUS) ×2 IMPLANT
CORD BIPOLAR FORCEPS 12FT (ELECTRODE) ×2 IMPLANT
COVER PROBE W GEL 5X96 (DRAPES) ×2 IMPLANT
COVER SURGICAL LIGHT HANDLE (MISCELLANEOUS) ×2 IMPLANT
DERMABOND ADVANCED (GAUZE/BANDAGES/DRESSINGS) ×1
DERMABOND ADVANCED .7 DNX12 (GAUZE/BANDAGES/DRESSINGS) ×2 IMPLANT
DRAPE C-ARM 35X43 STRL (DRAPES) ×2 IMPLANT
DRAPE HEAD BAR (DRAPES) ×2 IMPLANT
DRAPE INCISE IOBAN 66X45 STRL (DRAPES) ×2 IMPLANT
DRAPE MICROSCOPE LEICA 54X105 (DRAPES) ×2 IMPLANT
DRAPE UTILITY XL STRL (DRAPES) ×2 IMPLANT
DRSG TEGADERM 2-3/8X2-3/4 SM (GAUZE/BANDAGES/DRESSINGS) ×4 IMPLANT
DRSG TEGADERM 4X4.75 (GAUZE/BANDAGES/DRESSINGS) ×1 IMPLANT
ELECT COATED BLADE 2.86 ST (ELECTRODE) ×2 IMPLANT
ELECT EMG 18 NIMS (NEUROSURGERY SUPPLIES) ×2
ELECTRODE EMG 18 NIMS (NEUROSURGERY SUPPLIES) ×1 IMPLANT
FORCEPS BIPOLAR SPETZLER 8 1.0 (NEUROSURGERY SUPPLIES) ×2 IMPLANT
GAUZE 4X4 16PLY ~~LOC~~+RFID DBL (SPONGE) ×2 IMPLANT
GAUZE SPONGE 4X4 12PLY STRL (GAUZE/BANDAGES/DRESSINGS) ×2 IMPLANT
GENERATOR PULSE INSPIRE (Generator) ×2 IMPLANT
GENERATOR PULSE INSPIRE IV (Generator) ×1 IMPLANT
GLOVE SURG ENC TEXT LTX SZ7 (GLOVE) ×2 IMPLANT
GOWN STRL REUS W/ TWL LRG LVL3 (GOWN DISPOSABLE) ×2 IMPLANT
GOWN STRL REUS W/TWL LRG LVL3 (GOWN DISPOSABLE) ×2
KIT BASIN OR (CUSTOM PROCEDURE TRAY) ×2 IMPLANT
KIT TURNOVER KIT B (KITS) ×2 IMPLANT
LEAD SENSING RESP INSPIRE (Lead) ×2 IMPLANT
LEAD SENSING RESP INSPIRE IV (Lead) ×1 IMPLANT
LEAD SLEEP STIM INSPIRE IV/V (Lead) ×1 IMPLANT
LEAD SLEEP STIMULATION INSPIRE (Lead) ×2 IMPLANT
LOOP VESSEL MAXI BLUE (MISCELLANEOUS) ×2 IMPLANT
LOOP VESSEL MINI RED (MISCELLANEOUS) ×2 IMPLANT
MARKER SKIN DUAL TIP RULER LAB (MISCELLANEOUS) ×4 IMPLANT
NDL HYPO 25GX1X1/2 BEV (NEEDLE) ×1 IMPLANT
NEEDLE HYPO 25GX1X1/2 BEV (NEEDLE) ×2 IMPLANT
NS IRRIG 1000ML POUR BTL (IV SOLUTION) ×2 IMPLANT
PAD ARMBOARD 7.5X6 YLW CONV (MISCELLANEOUS) ×2 IMPLANT
PASSER CATH 38CM DISP (INSTRUMENTS) ×2 IMPLANT
PENCIL SMOKE EVACUATOR (MISCELLANEOUS) ×2 IMPLANT
POSITIONER HEAD DONUT 9IN (MISCELLANEOUS) ×2 IMPLANT
PROBE NERVE STIMULATOR (NEUROSURGERY SUPPLIES) ×2 IMPLANT
REMOTE CONTROL SLEEP INSPIRE (MISCELLANEOUS) ×2 IMPLANT
SET WALTER ACTIVATION W/DRAPE (SET/KITS/TRAYS/PACK) ×2 IMPLANT
SLING ARM FOAM STRAP LRG (SOFTGOODS) IMPLANT
SPONGE INTESTINAL PEANUT (DISPOSABLE) ×2 IMPLANT
STAPLER VISISTAT 35W (STAPLE) ×2 IMPLANT
SUT SILK 2 0 SH (SUTURE) ×4 IMPLANT
SUT SILK 3 0 REEL (SUTURE) ×2 IMPLANT
SUT SILK 3-0 (SUTURE) ×2
SUT SILK 3-0 RB1 30XBRD (SUTURE) ×2
SUT VIC AB 4-0 PS2 27 (SUTURE) ×4 IMPLANT
SUT VIC AB 5-0 P-3 18XBRD (SUTURE) ×2 IMPLANT
SUT VIC AB 5-0 P3 18 (SUTURE) ×4
SUTURE SILK 3-0 RB1 30XBRD (SUTURE) ×2 IMPLANT
SYR 10ML LL (SYRINGE) ×2 IMPLANT
TOWEL GREEN STERILE (TOWEL DISPOSABLE) ×2 IMPLANT
TRAY ENT MC OR (CUSTOM PROCEDURE TRAY) ×2 IMPLANT

## 2021-04-13 NOTE — Anesthesia Postprocedure Evaluation (Signed)
Anesthesia Post Note ? ?Patient: Wayne [name] ? ?Procedure(s) Performed: IMPLANTATION OF HYPOGLOSSAL NERVE STIMULATOR (Right: Chest) ? ?  ? ?Patient location during evaluation: PACU ?Anesthesia Type: General ?Level of consciousness: awake and alert, oriented and patient cooperative ?Pain management: pain level controlled ?Vital Signs Assessment: post-procedure vital signs reviewed and stable ?Respiratory status: spontaneous breathing, nonlabored ventilation and respiratory function stable ?Cardiovascular status: blood pressure returned to baseline and stable ?Postop Assessment: no apparent nausea or vomiting ?Anesthetic complications: no ? ? ?No notable events documented. ? ?Last Vitals:  ?Vitals:  ? 04/13/21 1108 04/13/21 1125  ?BP: (!) 169/76 (!) 160/69  ?Pulse: (!) 58 (!) 57  ?Resp: 13 14  ?Temp: 36.6 ?C   ?SpO2: 92% 91%  ?  ?Last Pain:  ?Vitals:  ? 04/13/21 1108  ?TempSrc:   ?PainSc: 0-No pain  ? ? ?  ?  ?  ?  ?  ?  ? ?Tracey Hermance,E. Mckensie Scotti ? ? ? ? ?

## 2021-04-13 NOTE — Transfer of Care (Signed)
Immediate Anesthesia Transfer of Care Note ? ?Patient: Wayne [name] ? ?Procedure(s) Performed: IMPLANTATION OF HYPOGLOSSAL NERVE STIMULATOR (Right: Chest) ? ?Patient Location: PACU ? ?Anesthesia Type:General ? ?Level of Consciousness: drowsy ? ?Airway & Oxygen Therapy: Patient Spontanous Breathing and Patient connected to nasal cannula oxygen ? ?Post-op Assessment: Report given to RN and Post -op Vital signs reviewed and stable ? ?Post vital signs: Reviewed and stable ? ?Last Vitals:  ?Vitals Value Taken Time  ?BP 161/84 04/13/21 1038  ?Temp    ?Pulse 57 04/13/21 1047  ?Resp 14 04/13/21 1047  ?SpO2 100 % 04/13/21 1047  ?Vitals shown include unvalidated device data. ? ?Last Pain:  ?Vitals:  ? 04/13/21 0601  ?TempSrc: Oral  ?   ? ?  ? ?Complications: No notable events documented. ?

## 2021-04-13 NOTE — Anesthesia Procedure Notes (Signed)
Procedure Name: Intubation ?Date/Time: 04/13/2021 7:39 AM ?Performed by: Claris Che, CRNA ?Pre-anesthesia Checklist: Patient identified, Emergency Drugs available, Suction available, Patient being monitored and Timeout performed ?Patient Re-evaluated:Patient Re-evaluated prior to induction ?Oxygen Delivery Method: Circle system utilized ?Preoxygenation: Pre-oxygenation with 100% oxygen ?Induction Type: IV induction and Cricoid Pressure applied ?Ventilation: Mask ventilation without difficulty ?Laryngoscope Size: Mac and 4 ?Grade View: Grade III ?Tube type: Oral ?Tube size: 8.0 mm ?Airway Equipment and Method: Stylet ?Placement Confirmation: ETT inserted through vocal cords under direct vision, positive ETCO2 and breath sounds checked- equal and bilateral ?Secured at: 24 cm ?Tube secured with: Tape ?Dental Injury: Teeth and Oropharynx as per pre-operative assessment  ? ? ? ? ?

## 2021-04-13 NOTE — H&P (Signed)
Wayne [name] is an 71 y.o. male.   ?Chief Complaint: OSA ?HPI: Hx of OSA unable to tol CPAP ? ?Past Medical History:  ?Diagnosis Date  ? Arthritis   ? Basal cell carcinoma   ? MOHS  ? Coronary artery disease 09/2009  ? S/P MI and PTCA--X 4 STENTS  ? Depression   ? situational  ? Diverticulosis   ? Hip pain   ? CHRONIC  ? Hyperlipidemia   ? Hypertension   ? LBBB (left bundle branch block)   ? Myocardial infarction Wayne [name])   ? Neuropathy   ? IDIOPATHIC  ? Neuropathy   ? Sleep apnea 11/22/2016  ? does not wear CPAP  ? ? ?Past Surgical History:  ?Procedure Laterality Date  ? CARDIAC CATHETERIZATION  09/11  ? CHOLECYSTECTOMY    ? DRUG INDUCED ENDOSCOPY N/A 01/17/2021  ? Procedure: DRUG INDUCED SLEEP ENDOSCOPY;  Surgeon: Wayne Belfast, MD;  Location: [facility];  Service: ENT;  Laterality: N/A;  ? JOINT REPLACEMENT  11/22/2016  ? right total knee 10/2016  ? LUMBAR LAMINECTOMY/DECOMPRESSION MICRODISCECTOMY Left 11/26/2016  ? Procedure: Laminectomy for facet/synovial cyst - left - Lumbar four-Lumbar five;  Surgeon: Wayne Larsson, MD;  Location: [facility];  Service: Neurosurgery;  Laterality: Left;  ? RIGHT SHOULDER BONE TUMOR    ? SLIPPED CAPITAL FEMORAL EPIPHYSIS PINNING    ? ? ?Family History  ?Problem Relation Age of Onset  ? Cancer Mother   ? Lung cancer Father   ? ?Social History:  reports that he has never smoked. He has never used smokeless tobacco. He reports that he does not drink alcohol and does not use drugs. ? ?Allergies:  ?Allergies  ?Allergen Reactions  ? Trazodone And Nefazodone Other (See Comments)  ?  Headaches.  ? ? ?Medications Prior to Admission  ?Medication Sig Dispense Refill  ? cyclobenzaprine (FLEXERIL) 10 MG tablet Take 10 mg by mouth 3 (three) times daily as needed for muscle spasms.    ? docusate sodium (COLACE) 100 MG capsule Take 100 mg by mouth daily as needed for moderate constipation.    ? DULoxetine (CYMBALTA) 60 MG capsule Take 60 mg by mouth 2 (two) times daily.    ? lisinopril (ZESTRIL) 5 MG tablet  Take 1 tablet (5 mg total) by mouth daily. (Patient taking differently: Take 5 mg by mouth every evening.) 90 tablet 3  ? metoprolol succinate (TOPROL-XL) 25 MG 24 hr tablet TAKE 1/2 TABLET BY MOUTH TWO TIMES A DAY 90 tablet 3  ? nitroGLYCERIN (NITROSTAT) 0.4 MG SL tablet Dissolve one tablet under tongue every 5 minutes as needed for chest pain; up to 3 doses--call 911 if pain persist 25 tablet 3  ? Omega-3 Fatty Acids (FISH OIL) 1000 MG CAPS Take 2 capsules (2,000 mg total) by mouth daily. (Patient taking differently: Take 1,000 mg by mouth in the morning and at bedtime.)  0  ? oxyCODONE-acetaminophen (PERCOCET) 10-325 MG per tablet Take 1 tablet every 6 (six) hours as needed by mouth for pain.     ? pregabalin (LYRICA) 150 MG capsule Take 150 mg by mouth 2 (two) times daily.    ? rosuvastatin (CRESTOR) 40 MG tablet TAKE ONE TABLET BY MOUTH EVERY EVENING 90 tablet 3  ? zolpidem (AMBIEN) 10 MG tablet Take 10 mg by mouth at bedtime.  3  ? clopidogrel (PLAVIX) 75 MG tablet Take 1 tablet (75 mg total) by mouth daily. 90 tablet 3  ? diclofenac Sodium (VOLTAREN) 1 % GEL Apply 1  application. topically 2 (two) times daily as needed (pain).    ? Respiratory Therapy Supplies (CARETOUCH CPAP & BIPAP HOSE) MISC See admin instructions.    ? ? ?Results for orders placed or performed during the hospital encounter of 04/13/21 (from the past 48 hour(s))  ?Basic metabolic panel per protocol     Status: Abnormal  ? Collection Time: 04/13/21  5:59 AM  ?Result Value Ref Range  ? Sodium 134 (L) 135 - 145 mmol/L  ? Potassium 4.0 3.5 - 5.1 mmol/L  ? Chloride 102 98 - 111 mmol/L  ? CO2 27 22 - 32 mmol/L  ? Glucose, Bld 112 (H) 70 - 99 mg/dL  ?  Comment: Glucose reference range applies only to samples taken after fasting for at least 8 hours.  ? BUN 17 8 - 23 mg/dL  ? Creatinine, Ser 0.76 0.61 - 1.24 mg/dL  ? Calcium 8.8 (L) 8.9 - 10.3 mg/dL  ? GFR, Estimated >60 >60 mL/min  ?  Comment: (NOTE) ?Calculated using the CKD-EPI Creatinine  Equation (2021) ?  ? Anion gap 5 5 - 15  ?  Comment: Performed at [facility], [place] [address] [place], [place] [address]  ?CBC per protocol     Status: Abnormal  ? Collection Time: 04/13/21  5:59 AM  ?Result Value Ref Range  ? WBC 6.8 4.0 - 10.5 K/uL  ? RBC 4.01 (L) 4.22 - 5.81 MIL/uL  ? Hemoglobin 12.4 (L) 13.0 - 17.0 g/dL  ? HCT 36.9 (L) 39.0 - 52.0 %  ? MCV 92.0 80.0 - 100.0 fL  ? MCH 30.9 26.0 - 34.0 pg  ? MCHC 33.6 30.0 - 36.0 g/dL  ? RDW 12.6 11.5 - 15.5 %  ? Platelets 186 150 - 400 K/uL  ? nRBC 0.0 0.0 - 0.2 %  ?  Comment: Performed at [facility], [place] [address] [place], [place] [address]  ? ?No results found. ? ?Review of Systems  ?Respiratory:  Positive for apnea.   ? ?Blood pressure 133/70, pulse (!) 54, temperature 97.7 ?F (36.5 ?C), temperature source Oral, resp. rate 17, height 5' 10.5" (1.791 m), weight 103 kg, SpO2 95 %. ?Physical Exam ?Constitutional:   ?   Appearance: He is normal weight.  ?Cardiovascular:  ?   Rate and Rhythm: Normal rate.  ?Pulmonary:  ?   Effort: Pulmonary effort is normal.  ?Musculoskeletal:  ?   Cervical back: Normal range of motion.  ?Neurological:  ?   Mental Status: He is alert.  ?  ? ?Assessment/Plan ?Adm for OP Inspire Implant for hypoglossal nerve stimulation ? ?Wayne Belfast, MD ?04/13/2021, 7:27 AM ? ? ? ?

## 2021-04-13 NOTE — Op Note (Signed)
Operative Note: INSPIRE IMPLANT ? ?Patient: Wayne [name] ? ?Medical record number: [id number] ? ?Date:04/13/2021 ? ?Pre-operative Indications: Moderate/severe obstructive sleep apnea with positive airway pressure intolerance ? ?Postoperative Indications: Same ? ?Surgical Procedure:  1.  12th cranial nerve (hypoglossal) stimulation implant ?   2.  Placement of chest wall respiratory sensor ?   3.  Electronic analysis of implanted neurostimulator with pulse generator system ? ?Anesthesia: GET ? ?Surgeon: Delsa Bern, M.D. ? ?Assist: RNFA ? ?BMI: 32.11 ? ?Signs and symptoms of the patient's obstructive sleep apnea include poor sleep quality, daytime fatigue and hypersomnolence and inability to tolerate CPAP.  Indications for hypoglossal nerve stimulator are significant obstructive sleep apnea with inability to tolerate medical treatment. ? ?Complications: None ? ?EBL: Less than 100 cc ? ? ?Brief History: ?The patient is a 71 y.o. male with a history of moderate/severe obstructive sleep apnea.  The patient has undergone work-up including sleep study and trial of CPAP which they did not tolerate.  Patient is unable to use long-term CPAP for control of obstructive sleep apnea.  Patient underwent DISE which showed anterior to posterior airway obstruction, considered a good candidate for Inspire Implant. Given the patient's history and findings, I recommended Inspire Implant under general anesthesia, risks and benefits were discussed in detail with the patient and family. They understand and agree with our plan for surgery which is scheduled at [facility] in [place] on an elective basis. ? ?Surgical Procedure: ?The patient is brought to the operating room on 04/13/2021 and placed in supine position on the operating table. General endotracheal anesthesia was established without difficulty. When the patient was adequately anesthetized, surgical timeout was performed and correct identification of the patient and the  surgical procedure. The patient was injected with 4 cc of 1% lidocaine 1:100,000 dilution epinephrine in subcutaneous fashion in the proposed skin incisions.  The patient was positioned and prepped and draped in sterile fashion.  The NIMS monitoring system was positioned and electrodes were placed in the anterior floor of mouth and tongue to assess the genioglossus and styloglossus muscle groups.  Nerve monitoring was used throughout the surgical procedure. ? ?The procedure was begun by creating a modified right submandibular incision in the upper anterior lateral neck ~2 cm below the mandible and in a natural skin crease.  The incision was carried through the skin and underlying subcutaneous tissue to the level of the platysma.  Platysma muscle was divided and subplatysmal flaps were elevated superiorly and inferiorly.  The submandibular space was entered and the submandibular gland was identified and retracted posteriorly.  The digastric tendon was identified and dissection was carried out anterior and posterior along the tendon and muscle bellies.  The mylohyoid muscle was identified and retracted anteriorly and the hypoglossal nerve was carefully dissected across the anterior floor of the submandibular space.  Lateral branches to the retrusor muscles were identified and tested intraoperatively using the NIMS stimulator.  Stimulation electrode cuff for the hypoglossal nerve stimulator was placed distal to these branches on the medial hypoglossal nerve branch innervating the genioglossus muscle.  Diagnostic evaluation confirmed activation of the genioglossus muscle, resulting in genioglossal activation and tongue protrusion which was visually confirmed in the operating room.  The stimulation electrode was  sutured to the digastric tendon with interrupted 4-0 silk sutures. ? ?A second second incision was created in the anterior upper right chest wall overlying the second rib interspace.  Incision was carried  through the skin and underlying subcutaneous tissue to  the level of the pectoralis major muscle.  The IPG pocket was created deep to the subcutaneous layer and superficial to the pectoralis muscle.  Placement of the stimulation lead was then undertaken by gentle dissection through the pectoralis major muscle parallel to the muscle fibers overlying the second rib interspace.  The subpectoral fat plane was then divided bluntly and the medial border of the external intercostal muscle was identified.  1 cm posterior to the medial border, dissection was carried through the external intercostal and a plane was developed in the second rib interspace.  The sensor lead was then tunneled into the second rib interspace and sutured with 3-0 silk suture to secure it in proper orientation with the sensing probe facing the pleural space.  The pectoralis major muscle dissection was then brought back into its normal anatomic position and the sense lead was brought out into the subcutaneous pocket. ? ?The lead for the stimulating electrode was then tunneled in a subplatysmal fashion from the submandibular incision to the anterior chest wall incision.  The stimulating electrode and respiration sensing lead were connected to the implantable pulse generator.  Diagnostic evaluation was run confirming respiratory signal and good tongue protrusion on stimulation. ? ?The implantable pulse generator was then placed in the subclavicular pocket and sutured to the pectoralis fascia with 2-0 silk sutures sutures.  The incisions were thoroughly irrigated with bacitracin saline irrigation.  The incisions were then closed in multiple layers with 4-0 and 5-0 Vicryl interrupted sutures in the deep and superficial subcutaneous levels at each incision site.  Dermabond surgical glue was used for skin closure.  The patient's incisions were dressed with rolled gauze and Hypafix tape for pressure dressing.   ? ?An orogastric tube was passed and stomach  contents were aspirated. Patient was awakened from anesthetic and transferred from the operating room to the recovery room in stable condition. There were no complications and blood loss was minimal.  X-rays were obtained in the recovery room to assess the proper location of the pulse generator, sensor lead and stimulation lead. ? ? ?Delsa Bern, M.D. ?[facility] ?04/13/2021  ?

## 2021-04-16 ENCOUNTER — Encounter (HOSPITAL_COMMUNITY): Payer: Self-pay | Admitting: Otolaryngology

## 2021-04-16 DIAGNOSIS — G4700 Insomnia, unspecified: Secondary | ICD-10-CM | POA: Diagnosis not present

## 2021-04-16 DIAGNOSIS — R69 Illness, unspecified: Secondary | ICD-10-CM | POA: Diagnosis not present

## 2021-04-16 DIAGNOSIS — G609 Hereditary and idiopathic neuropathy, unspecified: Secondary | ICD-10-CM | POA: Diagnosis not present

## 2021-04-16 DIAGNOSIS — G894 Chronic pain syndrome: Secondary | ICD-10-CM | POA: Diagnosis not present

## 2021-04-27 DIAGNOSIS — G4731 Primary central sleep apnea: Secondary | ICD-10-CM | POA: Diagnosis not present

## 2021-04-28 DIAGNOSIS — G4731 Primary central sleep apnea: Secondary | ICD-10-CM | POA: Diagnosis not present

## 2021-04-28 DIAGNOSIS — G4733 Obstructive sleep apnea (adult) (pediatric): Secondary | ICD-10-CM | POA: Diagnosis not present

## 2021-05-21 DIAGNOSIS — G4700 Insomnia, unspecified: Secondary | ICD-10-CM | POA: Diagnosis not present

## 2021-05-21 DIAGNOSIS — G894 Chronic pain syndrome: Secondary | ICD-10-CM | POA: Diagnosis not present

## 2021-05-21 DIAGNOSIS — G609 Hereditary and idiopathic neuropathy, unspecified: Secondary | ICD-10-CM | POA: Diagnosis not present

## 2021-05-21 DIAGNOSIS — R69 Illness, unspecified: Secondary | ICD-10-CM | POA: Diagnosis not present

## 2021-05-22 ENCOUNTER — Ambulatory Visit: Payer: Medicare HMO | Admitting: Cardiology

## 2021-05-22 ENCOUNTER — Encounter: Payer: Self-pay | Admitting: Cardiology

## 2021-05-22 VITALS — BP 118/64 | HR 63 | Ht 70.5 in | Wt 231.0 lb

## 2021-05-22 DIAGNOSIS — I10 Essential (primary) hypertension: Secondary | ICD-10-CM

## 2021-05-22 DIAGNOSIS — G4733 Obstructive sleep apnea (adult) (pediatric): Secondary | ICD-10-CM

## 2021-05-22 NOTE — Progress Notes (Signed)
? ?Date:  05/22/2021  ? ?ID:  Wayne [name], DOB 1950/10/09, MRN [id number] ?The patient was identified using 2 identifiers. ? ?PCP:  Gaynelle Arabian, MD ?  ?[facility] Providers ?Cardiologist:  Larae Grooms, MD    ? ?Evaluation Performed:  Follow-Up Visit ? ?Chief Complaint:  OSA ? ?History of Present Illness:   ? ?Wayne [name] is a 71 y.o. male with a hx of ASCAD s/p PCI in 2011, HLD, LBBB and hx of OSA but was not wearing his CPAP.  He had been followed by Dr. Radene Gunning years ago but did not tolerate the CPAP and stopped using it  He was referred for sleep study by Dr. Irish Lack.  He underwent a home sleep study in 12/2019 showing severe OSA with an AHI of 50.7/hr and nocturnal hypoxemia with O2 sats as low as 40%. He underwent unsuccessful CPAP titration due to ongoing respiratory events and was placed on BiPAP at 20/16cm H2O.   ? ?Unfortunately he was intolerant to CPAP therapy because he could not tolerate the mask.  He was referred to ENT and underwent hypoglossal nerve stimulation implant in March 2023.  He is now here for activation of his device. ?Past Medical History:  ?Diagnosis Date  ? Arthritis   ? Basal cell carcinoma   ? MOHS  ? Coronary artery disease 09/2009  ? S/P MI and PTCA--X 4 STENTS  ? Depression   ? situational  ? Diverticulosis   ? Hip pain   ? CHRONIC  ? Hyperlipidemia   ? Hypertension   ? LBBB (left bundle branch block)   ? Myocardial infarction [facility])   ? Neuropathy   ? IDIOPATHIC  ? Neuropathy   ? Sleep apnea 11/22/2016  ? does not wear CPAP  ? ?Past Surgical History:  ?Procedure Laterality Date  ? CARDIAC CATHETERIZATION  09/11  ? CHOLECYSTECTOMY    ? DRUG INDUCED ENDOSCOPY N/A 01/17/2021  ? Procedure: DRUG INDUCED SLEEP ENDOSCOPY;  Surgeon: Jerrell Belfast, MD;  Location: [place];  Service: ENT;  Laterality: N/A;  ? IMPLANTATION OF HYPOGLOSSAL NERVE STIMULATOR Right 04/13/2021  ? Procedure: IMPLANTATION OF HYPOGLOSSAL NERVE STIMULATOR;  Surgeon: Jerrell Belfast, MD;  Location: [place];   Service: ENT;  Laterality: Right;  ? JOINT REPLACEMENT  11/22/2016  ? right total knee 10/2016  ? LUMBAR LAMINECTOMY/DECOMPRESSION MICRODISCECTOMY Left 11/26/2016  ? Procedure: Laminectomy for facet/synovial cyst - left - Lumbar four-Lumbar five;  Surgeon: Earnie Larsson, MD;  Location: [place];  Service: Neurosurgery;  Laterality: Left;  ? RIGHT SHOULDER BONE TUMOR    ? SLIPPED CAPITAL FEMORAL EPIPHYSIS PINNING    ?  ? ?Current Meds  ?Medication Sig  ? clopidogrel (PLAVIX) 75 MG tablet Take 1 tablet (75 mg total) by mouth daily.  ? cyclobenzaprine (FLEXERIL) 10 MG tablet Take 10 mg by mouth 3 (three) times daily as needed for muscle spasms.  ? diclofenac Sodium (VOLTAREN) 1 % GEL Apply 1 application. topically 2 (two) times daily as needed (pain).  ? docusate sodium (COLACE) 100 MG capsule Take 100 mg by mouth daily as needed for moderate constipation.  ? DULoxetine (CYMBALTA) 60 MG capsule Take 60 mg by mouth 2 (two) times daily.  ? lisinopril (ZESTRIL) 5 MG tablet Take 1 tablet (5 mg total) by mouth daily. (Patient taking differently: Take 5 mg by mouth every evening.)  ? metoprolol succinate (TOPROL-XL) 25 MG 24 hr tablet TAKE 1/2 TABLET BY MOUTH TWO TIMES A DAY  ? nitroGLYCERIN (NITROSTAT) 0.4 MG SL  tablet Dissolve one tablet under tongue every 5 minutes as needed for chest pain; up to 3 doses--call 911 if pain persist  ? Omega-3 Fatty Acids (FISH OIL) 1000 MG CAPS Take 2 capsules (2,000 mg total) by mouth daily. (Patient taking differently: Take 1,000 mg by mouth in the morning and at bedtime.)  ? oxyCODONE-acetaminophen (PERCOCET) 10-325 MG per tablet Take 1 tablet every 6 (six) hours as needed by mouth for pain.   ? pregabalin (LYRICA) 150 MG capsule Take 150 mg by mouth 2 (two) times daily.  ? Respiratory Therapy Supplies (CARETOUCH CPAP & BIPAP HOSE) MISC See admin instructions.  ? rosuvastatin (CRESTOR) 40 MG tablet TAKE ONE TABLET BY MOUTH EVERY EVENING  ? zolpidem (AMBIEN) 10 MG tablet Take 10 mg by mouth at  bedtime.  ?  ? ?Allergies:   Trazodone and nefazodone  ? ?Social History  ? ?Tobacco Use  ? Smoking status: Never  ? Smokeless tobacco: Never  ?Vaping Use  ? Vaping Use: Never used  ?Substance Use Topics  ? Alcohol use: No  ? Drug use: No  ?  ? ?Family Hx: ?The patient's family history includes Cancer in his mother; Lung cancer in his father. ? ?ROS:   ?Please see the history of present illness.    ? ?All other systems reviewed and are negative. ? ? ?Prior CV studies:   ?The following studies were reviewed today: ? ?HST, CPAP and BIPAP titrations, PAP download ? ?Labs/Other Tests and Data Reviewed:   ? ?EKG:  No ECG reviewed. ? ?Recent Labs: ?04/13/2021: BUN 17; Creatinine, Ser 0.76; Hemoglobin 12.4; Platelets 186; Potassium 4.0; Sodium 134  ? ?Recent Lipid Panel ?Lab Results  ?Component Value Date/Time  ? CHOL 128 07/25/2014 08:06 AM  ? TRIG 94.0 07/25/2014 08:06 AM  ? HDL 38.70 (L) 07/25/2014 08:06 AM  ? CHOLHDL 3 07/25/2014 08:06 AM  ? LDLCALC 71 07/25/2014 08:06 AM  ? LDLDIRECT 76.8 02/22/2013 07:48 AM  ? ? ?Wt Readings from Last 3 Encounters:  ?05/22/21 231 lb (104.8 kg)  ?04/13/21 227 lb (103 kg)  ?03/28/21 230 lb 2.6 oz (104.4 kg)  ?  ?    ?Objective:   ? ?Vital Signs:  BP 118/64   Pulse 63   Ht 5' 10.5" (1.791 m)   Wt 231 lb (104.8 kg)   SpO2 95%   BMI 32.68 kg/m?   ?GEN: Well nourished, well developed in no acute distress ?HEENT: Normal ?NECK: No JVD; No carotid bruits ?LYMPHATICS: No lymphadenopathy ?CARDIAC:RRR, no murmurs, rubs, gallops ?RESPIRATORY:  Clear to auscultation without rales, wheezing or rhonchi  ?ABDOMEN: Soft, non-tender, non-distended ?MUSCULOSKELETAL:  No edema; No deformity  ?SKIN: Warm and dry.  Surgical wounds clean, dry and intact ?NEUROLOGIC:  Alert and oriented x 3 ?PSYCHIATRIC:  Normal affect   ?ASSESSMENT & PLAN:   ? ?OSA  ?-Unfortunately he did not tolerate CPAP therapy due to inability to tolerate the mask  ?-He was evaluated by ENT and on 04/13/2021 underwent inspire  hypoglossal nerve stimulator implantation ?-He is now here to activate his device ?-His device was interrogated with functional threshold  0.9V. ?-Final settings: ? -Amplitude 0.9V ? -Patient control 0.9 to 1.9V ? -Pulse width 90 micro sec ? -Rate '33Hz'$  ? -Start delay 35 min ? -Pause time 15 min ? -Therapy duration 8hr ? -Electrodes +-+ ?-Followup with me virtual check-in in 4 weeks and 8 weeks in office ? ?Hypertension ?-BP is controlled on exam today ?-Continue prescription drug management Toprol-XL 12.5 mg  daily, lisinopril 5 mg daily with as needed refills ? ? ?Tests Ordered: ?No orders of the defined types were placed in this encounter. ? ? ? ?Medication Changes: ?No orders of the defined types were placed in this encounter. ? ? ? ?Follow Up:  Virtual visit 4 weeks ? ?Signed, ?Fransico Him, MD  ?05/22/2021 1:50 PM    ?[place] ? ? ?

## 2021-05-22 NOTE — Patient Instructions (Addendum)
Medication Instructions:  ?Your physician recommends that you continue on your current medications as directed. Please refer to the Current Medication list given to you today. ? ?*If you need a refill on your cardiac medications before your next appointment, please call your pharmacy* ? ? ?Follow-Up: ?At [facility], you and your health needs are our priority.  As part of our continuing mission to provide you with exceptional heart care, we have created designated Provider Care Teams.  These Care Teams include your primary Cardiologist (physician) and Advanced Practice Providers (APPs -  Physician Assistants and Nurse Practitioners) who all work together to provide you with the care you need, when you need it. ? ?Your next appointment:   ?4 week virtual visit: 6/12 at 9:00am ? ?8 week in office visit: 7/17 at 8:40am  ? ? ?Important Information About Sugar ? ? ? ? ?  ?

## 2021-05-28 DIAGNOSIS — G4733 Obstructive sleep apnea (adult) (pediatric): Secondary | ICD-10-CM | POA: Diagnosis not present

## 2021-05-28 DIAGNOSIS — G4731 Primary central sleep apnea: Secondary | ICD-10-CM | POA: Diagnosis not present

## 2021-05-31 DIAGNOSIS — M19031 Primary osteoarthritis, right wrist: Secondary | ICD-10-CM | POA: Diagnosis not present

## 2021-06-25 ENCOUNTER — Encounter: Payer: Self-pay | Admitting: Cardiology

## 2021-06-25 ENCOUNTER — Telehealth (INDEPENDENT_AMBULATORY_CARE_PROVIDER_SITE_OTHER): Payer: Medicare HMO | Admitting: Cardiology

## 2021-06-25 DIAGNOSIS — I10 Essential (primary) hypertension: Secondary | ICD-10-CM | POA: Diagnosis not present

## 2021-06-25 DIAGNOSIS — G609 Hereditary and idiopathic neuropathy, unspecified: Secondary | ICD-10-CM | POA: Diagnosis not present

## 2021-06-25 DIAGNOSIS — G4733 Obstructive sleep apnea (adult) (pediatric): Secondary | ICD-10-CM | POA: Diagnosis not present

## 2021-06-25 DIAGNOSIS — F329 Major depressive disorder, single episode, unspecified: Secondary | ICD-10-CM | POA: Diagnosis not present

## 2021-06-25 DIAGNOSIS — G4700 Insomnia, unspecified: Secondary | ICD-10-CM | POA: Diagnosis not present

## 2021-06-25 DIAGNOSIS — Z79891 Long term (current) use of opiate analgesic: Secondary | ICD-10-CM | POA: Diagnosis not present

## 2021-06-25 DIAGNOSIS — R69 Illness, unspecified: Secondary | ICD-10-CM | POA: Diagnosis not present

## 2021-06-25 DIAGNOSIS — G894 Chronic pain syndrome: Secondary | ICD-10-CM | POA: Diagnosis not present

## 2021-06-25 NOTE — Progress Notes (Signed)
Date:  06/25/2021   ID:  Wayne [name], DOB [birth year], MRN [id number] The patient was identified using 2 identifiers.  PCP:  Gaynelle Arabian, MD   [facility] Providers Cardiologist:  Larae Grooms, MD     Evaluation Performed:  Follow-Up Visit  Chief Complaint:  OSA  History of Present Illness:    Wayne [name] is a 71 y.o. male with a hx of ASCAD s/p PCI in 2011, HLD, LBBB and hx of OSA but was not wearing his CPAP.  He had been followed by Dr. Radene Gunning years ago but did not tolerate the CPAP and stopped using it  He was referred for sleep study by Dr. Irish Lack.  He underwent a home sleep study in 12/2019 showing severe OSA with an AHI of 50.7/hr and nocturnal hypoxemia with O2 sats as low as 40%. He underwent unsuccessful CPAP titration due to ongoing respiratory events and was placed on BiPAP at 20/16cm H2O.    Unfortunately he was intolerant to CPAP therapy because he could not tolerate the mask.  He was referred to ENT and underwent hypoglossal nerve stimulation implant in March 2023.  He was here 4 weeks ago and his device was activated.  He is now here for his 4-week check-in.  He is doing well with his inspire device and using it every night and even during naps.  He is currently on level 6 which is 1.4 volts.  Still not feeling as rested as the CPAP but definitely feels good.    Past Medical History:  Diagnosis Date   Arthritis    Basal cell carcinoma    MOHS   Coronary artery disease 09/2009   S/P MI and PTCA--X 4 STENTS   Depression    situational   Diverticulosis    Hip pain    CHRONIC   Hyperlipidemia    Hypertension    LBBB (left bundle branch block)    Myocardial infarction ([place])    Neuropathy    IDIOPATHIC   Neuropathy    Sleep apnea 11/22/2016   does not wear CPAP   Past Surgical History:  Procedure Laterality Date   CARDIAC CATHETERIZATION  09/11   CHOLECYSTECTOMY     DRUG INDUCED ENDOSCOPY N/A 01/17/2021   Procedure: DRUG INDUCED SLEEP ENDOSCOPY;   Surgeon: Jerrell Belfast, MD;  Location: [place];  Service: ENT;  Laterality: N/A;   IMPLANTATION OF HYPOGLOSSAL NERVE STIMULATOR Right 04/13/2021   Procedure: IMPLANTATION OF HYPOGLOSSAL NERVE STIMULATOR;  Surgeon: Jerrell Belfast, MD;  Location: [facility] OR;  Service: ENT;  Laterality: Right;   JOINT REPLACEMENT  11/22/2016   right total knee 10/2016   LUMBAR LAMINECTOMY/DECOMPRESSION MICRODISCECTOMY Left 11/26/2016   Procedure: Laminectomy for facet/synovial cyst - left - Lumbar four-Lumbar five;  Surgeon: Earnie Larsson, MD;  Location: [place];  Service: Neurosurgery;  Laterality: Left;   RIGHT SHOULDER BONE TUMOR     SLIPPED CAPITAL FEMORAL EPIPHYSIS PINNING       Current Meds  Medication Sig   clopidogrel (PLAVIX) 75 MG tablet Take 1 tablet (75 mg total) by mouth daily.   cyclobenzaprine (FLEXERIL) 10 MG tablet Take 10 mg by mouth 3 (three) times daily as needed for muscle spasms.   diclofenac Sodium (VOLTAREN) 1 % GEL Apply 1 application. topically 2 (two) times daily as needed (pain).   docusate sodium (COLACE) 100 MG capsule Take 100 mg by mouth daily as needed for moderate constipation.   DULoxetine (CYMBALTA) 60 MG capsule Take 60 mg by mouth  2 (two) times daily.   lisinopril (ZESTRIL) 5 MG tablet Take 1 tablet (5 mg total) by mouth daily. (Patient taking differently: Take 5 mg by mouth every evening.)   metoprolol succinate (TOPROL-XL) 25 MG 24 hr tablet TAKE 1/2 TABLET BY MOUTH TWO TIMES A DAY   nitroGLYCERIN (NITROSTAT) 0.4 MG SL tablet Dissolve one tablet under tongue every 5 minutes as needed for chest pain; up to 3 doses--call 911 if pain persist   Omega-3 Fatty Acids (FISH OIL) 1000 MG CAPS Take 2 capsules (2,000 mg total) by mouth daily.   oxyCODONE-acetaminophen (PERCOCET) 10-325 MG per tablet Take 1 tablet every 6 (six) hours as needed by mouth for pain.    pregabalin (LYRICA) 150 MG capsule Take 150 mg by mouth 2 (two) times daily.   Respiratory Therapy Supplies (CARETOUCH CPAP &  BIPAP HOSE) MISC See admin instructions.   rosuvastatin (CRESTOR) 40 MG tablet TAKE ONE TABLET BY MOUTH EVERY EVENING   zolpidem (AMBIEN) 10 MG tablet Take 10 mg by mouth at bedtime.     Allergies:   Trazodone and nefazodone   Social History   Tobacco Use   Smoking status: Never   Smokeless tobacco: Never  Vaping Use   Vaping Use: Never used  Substance Use Topics   Alcohol use: No   Drug use: No     Family Hx: The patient's family history includes Cancer in his mother; Lung cancer in his father.  ROS:   Please see the history of present illness.     All other systems reviewed and are negative.   Prior CV studies:   The following studies were reviewed today:  None  Labs/Other Tests and Data Reviewed:    EKG:  No ECG reviewed.  Recent Labs: 04/13/2021: BUN 17; Creatinine, Ser 0.76; Hemoglobin 12.4; Platelets 186; Potassium 4.0; Sodium 134   Recent Lipid Panel Lab Results  Component Value Date/Time   CHOL 128 07/25/2014 08:06 AM   TRIG 94.0 07/25/2014 08:06 AM   HDL 38.70 (L) 07/25/2014 08:06 AM   CHOLHDL 3 07/25/2014 08:06 AM   LDLCALC 71 07/25/2014 08:06 AM   LDLDIRECT 76.8 02/22/2013 07:48 AM    Wt Readings from Last 3 Encounters:  05/22/21 231 lb (104.8 kg)  04/13/21 227 lb (103 kg)  03/28/21 230 lb 2.6 oz (104.4 kg)        Objective:    Vital Signs:  There were no vitals taken for this visit.  Well nourished, well developed male in no acute distress. Well appearing, alert and conversant, regular work of breathing,  good skin color  Eyes- anicteric mouth- oral mucosa is pink  neuro- grossly intact skin- no apparent rash or lesions or cyanosis  ASSESSMENT & PLAN:    OSA  -Unfortunately he did not tolerate CPAP therapy due to inability to tolerate the mask  -He was evaluated by ENT and on 04/13/2021 underwent inspire hypoglossal nerve stimulator implantation -His device was activated and interrogated at last office visit with functional threshold   0.9V. -Final settings at last office visit:  -Amplitude 0.9V  -Patient control 0.9 to 1.9V  -Pulse width 90 micro sec  -Rate '33Hz'$   -Start delay 35 min  -Pause time 15 min  -Therapy duration 8hr  -Electrodes +-+ -He is doing well at 4-week check-in without any problems -followup with me in 4 weeks in the office for fine tuning  Hypertension -BP controlled at home -Continue prescription drug management with Toprol-XL 12.5 mg daily, lisinopril 5 mg  daily with as needed refills  Tests Ordered: No orders of the defined types were placed in this encounter.    Medication Changes: No orders of the defined types were placed in this encounter.    Follow Up:  Virtual visit 4 weeks  Signed, Fransico Him, MD  06/25/2021 9:21 AM    [place]

## 2021-06-26 ENCOUNTER — Telehealth: Payer: Medicare HMO | Admitting: Cardiology

## 2021-07-06 ENCOUNTER — Telehealth: Payer: Self-pay

## 2021-07-11 DIAGNOSIS — L249 Irritant contact dermatitis, unspecified cause: Secondary | ICD-10-CM | POA: Diagnosis not present

## 2021-07-11 DIAGNOSIS — Z85828 Personal history of other malignant neoplasm of skin: Secondary | ICD-10-CM | POA: Diagnosis not present

## 2021-07-28 ENCOUNTER — Encounter: Payer: Self-pay | Admitting: Cardiology

## 2021-07-28 DIAGNOSIS — G4733 Obstructive sleep apnea (adult) (pediatric): Secondary | ICD-10-CM

## 2021-07-29 NOTE — Progress Notes (Unsigned)
Date:  07/29/2021   ID:  Wayne [name], DOB 04-10-1950, MRN [id number] The patient was identified using 2 identifiers.  PCP:  Gaynelle Arabian, MD   [facility] Providers Cardiologist:  Larae Grooms, MD     Evaluation Performed:  Follow-Up Visit  Chief Complaint:  OSA  History of Present Illness:    Wayne [name] is a 71 y.o. male with a hx of ASCAD s/p PCI in 2011, HLD, LBBB and hx of OSA but was not wearing his CPAP.  He had been followed by Dr. Radene Gunning years ago but did not tolerate the CPAP and stopped using it  He was referred for sleep study by Dr. Irish Lack.  He underwent a home sleep study in 12/2019 showing severe OSA with an AHI of 50.7/hr and nocturnal hypoxemia with O2 sats as low as 40%. He underwent unsuccessful CPAP titration due to ongoing respiratory events and was placed on BiPAP at 20/16cm H2O.    Unfortunately he was intolerant to CPAP therapy because he could not tolerate the mask.  He was referred to ENT and underwent hypoglossal nerve stimulation implant in March 2023.  He was here 4 weeks ago and his device was activated.  He is now here for his 4-week check-in.  He is doing well with his inspire device and using it every night and even during naps.  He is currently on level 9 which is 1.7 volts.  He is sleeping 6 hours nightly.  He denies any tongue sensitivity.  He still does not feel as rested at when he used CPAP but hated the CPAP device.  Past Medical History:  Diagnosis Date   Arthritis    Basal cell carcinoma    MOHS   Coronary artery disease 09/2009   S/P MI and PTCA--X 4 STENTS   Depression    situational   Diverticulosis    Hip pain    CHRONIC   Hyperlipidemia    Hypertension    LBBB (left bundle branch block)    Myocardial infarction ([place])    Neuropathy    IDIOPATHIC   Neuropathy    Sleep apnea 11/22/2016   does not wear CPAP   Past Surgical History:  Procedure Laterality Date   CARDIAC CATHETERIZATION  09/11   CHOLECYSTECTOMY      DRUG INDUCED ENDOSCOPY N/A 01/17/2021   Procedure: DRUG INDUCED SLEEP ENDOSCOPY;  Surgeon: Jerrell Belfast, MD;  Location: [place];  Service: ENT;  Laterality: N/A;   IMPLANTATION OF HYPOGLOSSAL NERVE STIMULATOR Right 04/13/2021   Procedure: IMPLANTATION OF HYPOGLOSSAL NERVE STIMULATOR;  Surgeon: Jerrell Belfast, MD;  Location: [facility] OR;  Service: ENT;  Laterality: Right;   JOINT REPLACEMENT  11/22/2016   right total knee 10/2016   LUMBAR LAMINECTOMY/DECOMPRESSION MICRODISCECTOMY Left 11/26/2016   Procedure: Laminectomy for facet/synovial cyst - left - Lumbar four-Lumbar five;  Surgeon: Earnie Larsson, MD;  Location: [place];  Service: Neurosurgery;  Laterality: Left;   RIGHT SHOULDER BONE TUMOR     SLIPPED CAPITAL FEMORAL EPIPHYSIS PINNING       No outpatient medications have been marked as taking for the 07/30/21 encounter (Appointment) with Sueanne Margarita, MD.     Allergies:   Trazodone and nefazodone   Social History   Tobacco Use   Smoking status: Never   Smokeless tobacco: Never  Vaping Use   Vaping Use: Never used  Substance Use Topics   Alcohol use: No   Drug use: No     Family Hx: The  patient's family history includes Cancer in his mother; Lung cancer in his father.  ROS:   Please see the history of present illness.     All other systems reviewed and are negative.   Prior CV studies:   The following studies were reviewed today:  None  Labs/Other Tests and Data Reviewed:    EKG:  No ECG reviewed.  Recent Labs: 04/13/2021: BUN 17; Creatinine, Ser 0.76; Hemoglobin 12.4; Platelets 186; Potassium 4.0; Sodium 134   Recent Lipid Panel Lab Results  Component Value Date/Time   CHOL 128 07/25/2014 08:06 AM   TRIG 94.0 07/25/2014 08:06 AM   HDL 38.70 (L) 07/25/2014 08:06 AM   CHOLHDL 3 07/25/2014 08:06 AM   LDLCALC 71 07/25/2014 08:06 AM   LDLDIRECT 76.8 02/22/2013 07:48 AM    Wt Readings from Last 3 Encounters:  05/22/21 231 lb (104.8 kg)  04/13/21 227 lb (103 kg)   03/28/21 230 lb 2.6 oz (104.4 kg)        Objective:    Vital Signs:  There were no vitals taken for this visit.  GEN: Well nourished, well developed in no acute distress HEENT: Normal NECK: No JVD; No carotid bruits LYMPHATICS: No lymphadenopathy CARDIAC:RRR, no murmurs, rubs, gallops RESPIRATORY:  Clear to auscultation without rales, wheezing or rhonchi  ABDOMEN: Soft, non-tender, non-distended MUSCULOSKELETAL:  No edema; No deformity  SKIN: Warm and dry NEUROLOGIC:  Alert and oriented x 3 PSYCHIATRIC:  Normal affect   ASSESSMENT & PLAN:    OSA  -Unfortunately he did not tolerate CPAP therapy due to inability to tolerate the mask  -He was evaluated by ENT and on 04/13/2021 underwent inspire hypoglossal nerve stimulator implantation -His device was activated and interrogated at last office visit with functional threshold  0.9V. -Final settings at last office visit:  -Amplitude 0.9V  -Patient control 0.9 to 1.9V  -Pulse width 90 micro sec  -Rate '33Hz'$   -Start delay 35 min  -Pause time 15 min  -Therapy duration 8hr  -Electrodes +-+ -doing well today -device was interrogated today and he is on level 9 which is 1.7V and tolerating well. -will set up for in lab Inspire titration in the next 2 weeks  Hypertension -BP is adequately controlled on exam today -continue prescription drug management with Toprol XL 12.'5mg'$  daily and Lisinopril '5mg'$  daily with PRN refills  Tests Ordered: No orders of the defined types were placed in this encounter.    Medication Changes: No orders of the defined types were placed in this encounter.    Follow Up:  Virtual visit 4 weeks  Signed, Fransico Him, MD  07/29/2021 10:31 AM    [place]

## 2021-07-30 ENCOUNTER — Ambulatory Visit (INDEPENDENT_AMBULATORY_CARE_PROVIDER_SITE_OTHER): Payer: Medicare HMO | Admitting: Cardiology

## 2021-07-30 ENCOUNTER — Encounter: Payer: Self-pay | Admitting: Cardiology

## 2021-07-30 VITALS — BP 124/68 | HR 57 | Ht 70.5 in | Wt 231.4 lb

## 2021-07-30 DIAGNOSIS — I10 Essential (primary) hypertension: Secondary | ICD-10-CM

## 2021-07-30 DIAGNOSIS — G4733 Obstructive sleep apnea (adult) (pediatric): Secondary | ICD-10-CM | POA: Diagnosis not present

## 2021-07-30 DIAGNOSIS — G4700 Insomnia, unspecified: Secondary | ICD-10-CM | POA: Diagnosis not present

## 2021-07-30 DIAGNOSIS — G609 Hereditary and idiopathic neuropathy, unspecified: Secondary | ICD-10-CM | POA: Diagnosis not present

## 2021-07-30 DIAGNOSIS — R69 Illness, unspecified: Secondary | ICD-10-CM | POA: Diagnosis not present

## 2021-07-30 DIAGNOSIS — G894 Chronic pain syndrome: Secondary | ICD-10-CM | POA: Diagnosis not present

## 2021-07-30 NOTE — Procedures (Addendum)
Erroneous encounter

## 2021-07-30 NOTE — Addendum Note (Signed)
Addended by: Fransico Him R on: 07/30/2021 11:40 AM   Modules accepted: Orders, Level of Service

## 2021-07-30 NOTE — Progress Notes (Signed)
This encounter was created in error - please disregard.

## 2021-07-30 NOTE — Patient Instructions (Signed)
Medication Instructions:  Your physician recommends that you continue on your current medications as directed. Please refer to the Current Medication list given to you today.  *If you need a refill on your cardiac medications before your next appointment, please call your pharmacy*  Testing/Procedures: In-Lab Inspire Titration  Follow-Up: At [facility], you and your health needs are our priority.  As part of our continuing mission to provide you with exceptional heart care, we have created designated Provider Care Teams.  These Care Teams include your primary Cardiologist (physician) and Advanced Practice Providers (APPs -  Physician Assistants and Nurse Practitioners) who all work together to provide you with the care you need, when you need it.  Your next appointment:   Follow up once we receive your titration results.   Important Information About Sugar

## 2021-07-31 ENCOUNTER — Telehealth: Payer: Self-pay | Admitting: *Deleted

## 2021-07-31 DIAGNOSIS — I252 Old myocardial infarction: Secondary | ICD-10-CM

## 2021-07-31 DIAGNOSIS — I10 Essential (primary) hypertension: Secondary | ICD-10-CM

## 2021-07-31 DIAGNOSIS — G4733 Obstructive sleep apnea (adult) (pediatric): Secondary | ICD-10-CM

## 2021-07-31 NOTE — Telephone Encounter (Signed)
-----   Message from Antonieta Iba, RN sent at 07/30/2021 11:21 AM EDT ----- Dr. Radford Pax ordered an In-Lab Inspire titration for this patient. She would like for it to be done in two weeks.  Thanks!

## 2021-08-03 ENCOUNTER — Telehealth: Payer: Self-pay | Admitting: *Deleted

## 2021-08-03 NOTE — Telephone Encounter (Signed)
-----   Message from Sueanne Margarita, MD sent at 07/30/2021 11:39 AM EDT ----- Disregard the order I just sent for CPAP titration>>wrong patient

## 2021-08-03 NOTE — Telephone Encounter (Signed)
Order disregarded.

## 2021-08-14 NOTE — Addendum Note (Signed)
Addended by: Freada Bergeron on: 08/14/2021 12:58 PM   Modules accepted: Orders

## 2021-08-14 NOTE — Telephone Encounter (Addendum)
Notice of Denial came for this patient from [place]. The request was denied based on Medicare National Coverage Determinations. Diagnostic Testing Pre- and Post-Hypoglossal Nerve Stimulator Implantation we cannot approve this request.  The requested CPAP titration sleep study (CPT 95811) to adjust your HGNS without the application of CPAP is not supported. The appropriate request would be a diagnostic sleep study (CPT 95810).  A sleep study in a sleep l;ab (CPT [phone]) is more likely to show your doctor all of the details they need to see in order to find the source of your problem. The study would be approved if requested.

## 2021-08-20 NOTE — Telephone Encounter (Addendum)
Order for NPSG placed and approved by insurance.  Your case has been Approved.  CPT Code: 03159 Description: POLYSOM >6 YRS >=4 ADD PARAM Authorization Number: [id number]

## 2021-08-27 DIAGNOSIS — Z125 Encounter for screening for malignant neoplasm of prostate: Secondary | ICD-10-CM | POA: Diagnosis not present

## 2021-08-27 DIAGNOSIS — I208 Other forms of angina pectoris: Secondary | ICD-10-CM | POA: Diagnosis not present

## 2021-08-27 DIAGNOSIS — I25119 Atherosclerotic heart disease of native coronary artery with unspecified angina pectoris: Secondary | ICD-10-CM | POA: Diagnosis not present

## 2021-08-27 DIAGNOSIS — Z79899 Other long term (current) drug therapy: Secondary | ICD-10-CM | POA: Diagnosis not present

## 2021-08-27 DIAGNOSIS — G4733 Obstructive sleep apnea (adult) (pediatric): Secondary | ICD-10-CM | POA: Diagnosis not present

## 2021-08-27 DIAGNOSIS — M25559 Pain in unspecified hip: Secondary | ICD-10-CM | POA: Diagnosis not present

## 2021-08-27 DIAGNOSIS — Z0000 Encounter for general adult medical examination without abnormal findings: Secondary | ICD-10-CM | POA: Diagnosis not present

## 2021-08-27 DIAGNOSIS — R7303 Prediabetes: Secondary | ICD-10-CM | POA: Diagnosis not present

## 2021-08-27 DIAGNOSIS — Z1331 Encounter for screening for depression: Secondary | ICD-10-CM | POA: Diagnosis not present

## 2021-08-27 DIAGNOSIS — E782 Mixed hyperlipidemia: Secondary | ICD-10-CM | POA: Diagnosis not present

## 2021-08-27 DIAGNOSIS — G629 Polyneuropathy, unspecified: Secondary | ICD-10-CM | POA: Diagnosis not present

## 2021-09-03 DIAGNOSIS — G4700 Insomnia, unspecified: Secondary | ICD-10-CM | POA: Diagnosis not present

## 2021-09-03 DIAGNOSIS — I10 Essential (primary) hypertension: Secondary | ICD-10-CM | POA: Diagnosis not present

## 2021-09-03 DIAGNOSIS — Z6833 Body mass index (BMI) 33.0-33.9, adult: Secondary | ICD-10-CM | POA: Diagnosis not present

## 2021-09-03 DIAGNOSIS — I2510 Atherosclerotic heart disease of native coronary artery without angina pectoris: Secondary | ICD-10-CM | POA: Diagnosis not present

## 2021-09-03 DIAGNOSIS — I252 Old myocardial infarction: Secondary | ICD-10-CM | POA: Diagnosis not present

## 2021-09-03 DIAGNOSIS — E785 Hyperlipidemia, unspecified: Secondary | ICD-10-CM | POA: Diagnosis not present

## 2021-09-03 DIAGNOSIS — G629 Polyneuropathy, unspecified: Secondary | ICD-10-CM | POA: Diagnosis not present

## 2021-09-03 DIAGNOSIS — G894 Chronic pain syndrome: Secondary | ICD-10-CM | POA: Diagnosis not present

## 2021-09-03 DIAGNOSIS — G609 Hereditary and idiopathic neuropathy, unspecified: Secondary | ICD-10-CM | POA: Diagnosis not present

## 2021-09-03 DIAGNOSIS — K5900 Constipation, unspecified: Secondary | ICD-10-CM | POA: Diagnosis not present

## 2021-09-03 DIAGNOSIS — Z7902 Long term (current) use of antithrombotics/antiplatelets: Secondary | ICD-10-CM | POA: Diagnosis not present

## 2021-09-03 DIAGNOSIS — Z809 Family history of malignant neoplasm, unspecified: Secondary | ICD-10-CM | POA: Diagnosis not present

## 2021-09-03 DIAGNOSIS — E669 Obesity, unspecified: Secondary | ICD-10-CM | POA: Diagnosis not present

## 2021-09-03 DIAGNOSIS — M1990 Unspecified osteoarthritis, unspecified site: Secondary | ICD-10-CM | POA: Diagnosis not present

## 2021-09-03 DIAGNOSIS — R69 Illness, unspecified: Secondary | ICD-10-CM | POA: Diagnosis not present

## 2021-09-03 NOTE — Telephone Encounter (Signed)
Sleep lab Lynnae Sandhoff called to say they will run the PSG study without the titration which was approved by the insurance. Lynnae Sandhoff states if he runs the [phone] that was not approved by the insurance it would be a cost to the patient because it was not approved by the insurance. Lynnae Sandhoff states he will proceed with the testing as stated unless the provider cancels the test. He will leave it up to the provider to figure out how he will be titrated.

## 2021-09-04 NOTE — Addendum Note (Signed)
Addended by: Freada Bergeron on: 09/04/2021 02:44 PM   Modules accepted: Orders

## 2021-09-05 ENCOUNTER — Encounter (HOSPITAL_BASED_OUTPATIENT_CLINIC_OR_DEPARTMENT_OTHER): Payer: Medicare HMO | Admitting: Cardiology

## 2021-09-05 ENCOUNTER — Ambulatory Visit (HOSPITAL_BASED_OUTPATIENT_CLINIC_OR_DEPARTMENT_OTHER): Payer: Medicare HMO | Attending: Cardiology | Admitting: Cardiology

## 2021-09-05 DIAGNOSIS — G4733 Obstructive sleep apnea (adult) (pediatric): Secondary | ICD-10-CM | POA: Insufficient documentation

## 2021-09-05 DIAGNOSIS — G4736 Sleep related hypoventilation in conditions classified elsewhere: Secondary | ICD-10-CM | POA: Diagnosis not present

## 2021-09-05 DIAGNOSIS — I252 Old myocardial infarction: Secondary | ICD-10-CM | POA: Diagnosis not present

## 2021-09-05 DIAGNOSIS — I10 Essential (primary) hypertension: Secondary | ICD-10-CM | POA: Insufficient documentation

## 2021-09-06 NOTE — Procedures (Signed)
Patient Name: Wayne [name], Wayne [name] Date: 09/05/2021 Gender: Male D.O.B: 1950/12/17 Age (years): 62 Referring Provider: Fransico Him MD, ABSM Height (inches): 70 Interpreting Physician: Fransico Him MD, ABSM Weight (lbs): 226 RPSGT: Carolin Coy BMI: 32 MRN: [id number] Neck Size: 17.00  CLINICAL INFORMATION Sleep Study Type: NPSG with Inspire Titration  Indication for sleep study: Fatigue, Hypertension, Obesity, Snoring  Epworth Sleepiness Score: 8  Most recent polysomnogram dated 12/23/2019 revealed an AHI of 50.7/h and RDI of 50.7/h. Most recent titration study dated 05/29/2020 revealed an AHI of 2.1/h.  SLEEP STUDY TECHNIQUE As per the AASM Manual for the Scoring of Sleep and Associated Events v2.3 (April 2016) with a hypopnea requiring 4% desaturations.  The channels recorded and monitored were frontal, central and occipital EEG, electrooculogram (EOG), submentalis EMG (chin), nasal and oral airflow, thoracic and abdominal wall motion, anterior tibialis EMG, snore microphone, electrocardiogram, and pulse oximetry.  MEDICATIONS Medications self-administered by patient taken the night of the study : ZOLPIDEM TARTRATE, DULOXETINE  SLEEP ARCHITECTURE The study was initiated at 10:41:22 PM and ended at 5:22:12 AM.  Sleep onset time was 50.2 minutes and the sleep efficiency was 77.8%. The total sleep time was 312 minutes.  Stage REM latency was 189.5 minutes.  The patient spent 11.4% of the night in stage N1 sleep, 76.8% in stage N2 sleep, 0.0% in stage N3 and 11.9% in REM.  Alpha intrusion was absent.  Supine sleep was 0.00%.  RESPIRATORY PARAMETERS The overall apnea/hypopnea index (AHI) was 24.8 per hour. There were 22 total apneas, including 15 obstructive, 2 central and 5 mixed apneas. There were 107 hypopneas and 7 RERAs.  The AHI during Stage REM sleep was 35.7 per hour.  AHI while supine was N/A per hour.  The mean oxygen saturation was 91.8%. The minimum  SpO2 during sleep was 74.0%.  loud snoring was noted during this study.  INSPIRE TITRATION Inspire titration performed starting at 1.6V and was increased for ongoing respiratory events to 2.6V The overall AHI was 7.5/hr at a maxiumum amplitude of 2.6V and O2 saturation minimum of 89%. Therapeutic amplitude found to be 2.6V.  CARDIAC DATA The 2 lead EKG demonstrated sinus rhythm. The mean heart rate was 58.9 beats per minute. Other EKG findings include: None.  LEG MOVEMENT DATA The total PLMS were 0 with a resulting PLMS index of 0.0. Associated arousal with leg movement index was 0.0 .  IMPRESSIONS - Moderate obstructive sleep apnea occurred during this study (AHI = 24.8/h).  - Patient was titrated to 2.6V with AHI of 7.5/hr and O2 saturation minimum of 89%. - No significant central sleep apnea occurred during this study (CAI = 0.4/h). - Moderate oxygen desaturation was noted during this study (Min O2 = 74.0%). - The patient snored with loud snoring volume. - No cardiac abnormalities were noted during this study. - Clinically significant periodic limb movements did not occur during sleep. No significant associated arousals.  DIAGNOSIS - Obstructive Sleep Apnea (G47.33) - Nocturnal Hypoxemia (G47.36)  RECOMMENDATIONS - Final settings on Inspire device were set at a lower amplitude of 1.6V and upper limit 2.6V. - Avoid alcohol, sedatives and other CNS depressants that may worsen sleep apnea and disrupt normal sleep architecture. - Sleep hygiene should be reviewed to assess factors that may improve sleep quality. - Weight management and regular exercise should be initiated or continued if appropriate. - Followup in office for fine tuning of device.  [Electronically signed] 09/06/2021 09:06 PM  Fransico Him MD, ABSM Diplomate, American  Board of Sleep Medicine

## 2021-09-12 ENCOUNTER — Telehealth: Payer: Self-pay | Admitting: *Deleted

## 2021-09-12 NOTE — Telephone Encounter (Signed)
Pt called back and stated that he received your message and thank you.

## 2021-09-12 NOTE — Telephone Encounter (Signed)
The patient has been notified of the result. Left detailed message on voicemail and informed patient to call back.Blessed Girdner Green, CMA 05/24/2021 12:08 PM     

## 2021-09-12 NOTE — Telephone Encounter (Signed)
-----   Message from Lauralee Evener, [place] sent at 09/11/2021 10:17 AM EDT -----  ----- Message ----- From: Sueanne Margarita, MD Sent: 09/06/2021   9:09 PM EDT To: Rebeca Alert Sleep Studies  Successful Inspire titration - Gae Bon please call Inspire rep to see when they want to see patient back in office

## 2021-09-19 ENCOUNTER — Telehealth: Payer: Self-pay | Admitting: *Deleted

## 2021-09-19 ENCOUNTER — Ambulatory Visit: Payer: Medicare HMO | Attending: Cardiology | Admitting: Cardiology

## 2021-09-19 ENCOUNTER — Encounter: Payer: Self-pay | Admitting: Cardiology

## 2021-09-19 VITALS — BP 110/60 | HR 69 | Ht 70.0 in | Wt 230.0 lb

## 2021-09-19 DIAGNOSIS — G4733 Obstructive sleep apnea (adult) (pediatric): Secondary | ICD-10-CM

## 2021-09-19 DIAGNOSIS — I10 Essential (primary) hypertension: Secondary | ICD-10-CM

## 2021-09-19 NOTE — Telephone Encounter (Signed)
DPR ok to leave message. Left message pt has been approved for itamar study. Left vm with PIN # [id number], as well if could do sleep study sometime between tonight and over the weekend.

## 2021-09-19 NOTE — Telephone Encounter (Signed)
Prior Authorization for [facility] sent to [employer] via Fax NO PA REQ

## 2021-09-19 NOTE — Telephone Encounter (Signed)
Pt was seen in the office today by Dr. Radford Pax who ordered an [facility]. Pt has been made aware to not open the box until he has been called with the PIN#. Pt agreeable to signed waiver.

## 2021-09-19 NOTE — Progress Notes (Signed)
Date:  09/19/2021   ID:  [name], DOB 1950/02/16, MRN [id number] The patient was identified using 2 identifiers.  PCP:  Gaynelle Arabian, MD   [facility] Providers Cardiologist:  Larae Grooms, MD     Evaluation Performed:  Follow-Up Visit  Chief Complaint:  OSA  History of Present Illness:    Wayne [name] is a 71 y.o. male with a hx of ASCAD s/p PCI in 2011, HLD, LBBB and hx of OSA but was not wearing his CPAP.  He had been followed by Dr. Radene Gunning years ago but did not tolerate the CPAP and stopped using it  He was referred for sleep study by Dr. Irish Lack.  He underwent a home sleep study in 12/2019 showing severe OSA with an AHI of 50.7/hr and nocturnal hypoxemia with O2 sats as low as 40%. He underwent unsuccessful CPAP titration due to ongoing respiratory events and was placed on BiPAP at 20/16cm H2O.    Unfortunately he was intolerant to CPAP therapy because he could not tolerate the mask.  He was referred to ENT and underwent hypoglossal nerve stimulation implant in March 2023.  At his last office visit he was doing well with his inspire device and was up to level 9 which was 1.7 V.   He underwent in lab inspire titration with a therapeutic output of 2.6 V with an AHI of 7.5 hours and lowest O2 saturation 89% at this level.  Final settings made were an output of 1.6 V to 2.6 V.  He is now here today for for follow-up.  He is doing well with his device and tolerating without difficulty.  He is currently up to level 6 which is  2.1volts.  He uses his device on average 6-7 hours nightly.  He said he was having a lot of tongue sensitivity and sore throat in the mornings when he got up to level 6.  Otherwise he is tolerating it quite well.  Past Medical History:  Diagnosis Date   Arthritis    Basal cell carcinoma    MOHS   Coronary artery disease 09/2009   S/P MI and PTCA--X 4 STENTS   Depression    situational   Diverticulosis    Hip pain    CHRONIC   Hyperlipidemia     Hypertension    LBBB (left bundle branch block)    Myocardial infarction ([place])    Neuropathy    IDIOPATHIC   Neuropathy    Sleep apnea 11/22/2016   does not wear CPAP   Past Surgical History:  Procedure Laterality Date   CARDIAC CATHETERIZATION  09/11   CHOLECYSTECTOMY     DRUG INDUCED ENDOSCOPY N/A 01/17/2021   Procedure: DRUG INDUCED SLEEP ENDOSCOPY;  Surgeon: Jerrell Belfast, MD;  Location: [place];  Service: ENT;  Laterality: N/A;   IMPLANTATION OF HYPOGLOSSAL NERVE STIMULATOR Right 04/13/2021   Procedure: IMPLANTATION OF HYPOGLOSSAL NERVE STIMULATOR;  Surgeon: Jerrell Belfast, MD;  Location: [facility] OR;  Service: ENT;  Laterality: Right;   JOINT REPLACEMENT  11/22/2016   right total knee 10/2016   LUMBAR LAMINECTOMY/DECOMPRESSION MICRODISCECTOMY Left 11/26/2016   Procedure: Laminectomy for facet/synovial cyst - left - Lumbar four-Lumbar five;  Surgeon: Earnie Larsson, MD;  Location: [place];  Service: Neurosurgery;  Laterality: Left;   RIGHT SHOULDER BONE TUMOR     SLIPPED CAPITAL FEMORAL EPIPHYSIS PINNING       Current Meds  Medication Sig   clopidogrel (PLAVIX) 75 MG tablet Take 1 tablet (75  mg total) by mouth daily.   cyclobenzaprine (FLEXERIL) 10 MG tablet Take 10 mg by mouth 3 (three) times daily as needed for muscle spasms.   diclofenac Sodium (VOLTAREN) 1 % GEL Apply 1 application. topically 2 (two) times daily as needed (pain).   docusate sodium (COLACE) 100 MG capsule Take 100 mg by mouth daily as needed for moderate constipation.   DULoxetine (CYMBALTA) 60 MG capsule Take 60 mg by mouth 2 (two) times daily.   lisinopril (ZESTRIL) 5 MG tablet Take 1 tablet (5 mg total) by mouth daily.   metoprolol succinate (TOPROL-XL) 25 MG 24 hr tablet TAKE 1/2 TABLET BY MOUTH TWO TIMES A DAY   nitroGLYCERIN (NITROSTAT) 0.4 MG SL tablet Dissolve one tablet under tongue every 5 minutes as needed for chest pain; up to 3 doses--call 911 if pain persist   Omega-3 Fatty Acids (FISH OIL) 1000 MG  CAPS Take 2 capsules (2,000 mg total) by mouth daily.   oxyCODONE-acetaminophen (PERCOCET) 10-325 MG per tablet Take 1 tablet every 6 (six) hours as needed by mouth for pain.    pregabalin (LYRICA) 150 MG capsule Take 150 mg by mouth 2 (two) times daily.   Respiratory Therapy Supplies (CARETOUCH CPAP & BIPAP HOSE) MISC See admin instructions.   rosuvastatin (CRESTOR) 40 MG tablet TAKE ONE TABLET BY MOUTH EVERY EVENING   zolpidem (AMBIEN) 10 MG tablet Take 10 mg by mouth at bedtime.     Allergies:   Trazodone and nefazodone   Social History   Tobacco Use   Smoking status: Never   Smokeless tobacco: Never  Vaping Use   Vaping Use: Never used  Substance Use Topics   Alcohol use: No   Drug use: No     Family Hx: The patient's family history includes Cancer in his mother; Lung cancer in his father.  ROS:   Please see the history of present illness.     All other systems reviewed and are negative.   Prior CV studies:   The following studies were reviewed today:  None  Labs/Other Tests and Data Reviewed:    EKG:  No ECG reviewed.  Recent Labs: 04/13/2021: BUN 17; Creatinine, Ser 0.76; Hemoglobin 12.4; Platelets 186; Potassium 4.0; Sodium 134   Recent Lipid Panel Lab Results  Component Value Date/Time   CHOL 128 07/25/2014 08:06 AM   TRIG 94.0 07/25/2014 08:06 AM   HDL 38.70 (L) 07/25/2014 08:06 AM   CHOLHDL 3 07/25/2014 08:06 AM   LDLCALC 71 07/25/2014 08:06 AM   LDLDIRECT 76.8 02/22/2013 07:48 AM    Wt Readings from Last 3 Encounters:  09/19/21 230 lb (104.3 kg)  09/05/21 226 lb (102.5 kg)  07/30/21 231 lb 6.4 oz (105 kg)    STOP-Bang Score:  5      Objective:    Vital Signs:  BP 110/60 (BP Location: Left Arm, Patient Position: Sitting, Cuff Size: Normal)   Pulse 69   Ht '5\' 10"'$  (1.778 m)   Wt 230 lb (104.3 kg)   SpO2 92%   BMI 33.00 kg/m   Well nourished, well developed male in no acute distress. Well appearing, alert and conversant, regular work of  breathing,  good skin color  Eyes- anicteric mouth- oral mucosa is pink  neuro- grossly intact skin- no apparent rash or lesions or cyanosis   ASSESSMENT & PLAN:    OSA  -Unfortunately he did not tolerate CPAP therapy due to inability to tolerate the mask  -He was evaluated by ENT and  on 04/13/2021 underwent inspire hypoglossal nerve stimulator implantation -His device was activated and interrogated at last office visit with functional threshold  0.9V. -Final settings at last office visit:  -Amplitude 0.9V  -Patient control 0.9 to 1.9V  -Pulse width 90 micro sec  -Rate '33Hz'$   -Start delay 35 min  -Pause time 15 min  -Therapy duration 8hr  -Electrodes +-+ -At last office visit his device was interrogated and he was on level 9 which is 1.7V -Status post inspire titration sleep lab with AHI 7.5/h at a maximum amplitude of 2.6 V and O2 saturation minimum 89%.  Therapeutic amplitude found to be 2.6 V.  His device was set at an output range of 1.6 to 2.6 V. -He continues to tolerate his inspire device at level 6 which is 2.1 V he is having sore throat and some tongue sensitivity. -In looking at his sleep study recently done his AHI was actually only 3.7/h at a voltage of 1.6 V but O2 saturations were as low as 85% -Device was interrogated in the office today and following changes were made:  -Changed the minimum output range to 1.4 V which is 0.2 V below his functional threshold of 1.6 V during sleep study where his AHI was only 3.7/h  -His new output range is 1.4 to 1.8 V with a goal of reaching level 3 which is 1.6 V -I will get a watch pat 1 home sleep study tonight on level 3 and if this looks good with adequate AHI and no nocturnal hypoxemia then we will continue on this level and see him back in 6 months   Hypertension -BP adequately controlled on exam today continue -Drug management lisinopril 5 mg daily Toprol-XL 12.5 mg daily with as needed refills  Tests Ordered: Orders Placed  This Encounter  Procedures   Itamar Sleep Study     Medication Changes: No orders of the defined types were placed in this encounter.    Follow Up:  Virtual visit 4 weeks  Signed, Fransico Him, MD  09/19/2021 9:22 AM    [facility]

## 2021-09-19 NOTE — Patient Instructions (Signed)
Medication Instructions:  Your physician recommends that you continue on your current medications as directed. Please refer to the Current Medication list given to you today.  *If you need a refill on your cardiac medications before your next appointment, please call your pharmacy*  Testing/Procedures: Your physician has recommended that you have a sleep study. This test records several body functions during sleep, including: brain activity, eye movement, oxygen and carbon dioxide blood levels, heart rate and rhythm, breathing rate and rhythm, the flow of air through your mouth and nose, snoring, body muscle movements, and chest and belly movement.  Follow-Up: At [facility], you and your health needs are our priority.  As part of our continuing mission to provide you with exceptional heart care, we have created designated Provider Care Teams.  These Care Teams include your primary Cardiologist (physician) and Advanced Practice Providers (APPs -  Physician Assistants and Nurse Practitioners) who all work together to provide you with the care you need, when you need it.  Your next appointment:   6 month(s)  The format for your next appointment:   In Person  Provider:   Fransico Him, MD  Important Information About Sugar

## 2021-09-24 ENCOUNTER — Encounter (HOSPITAL_BASED_OUTPATIENT_CLINIC_OR_DEPARTMENT_OTHER): Payer: Medicare HMO | Admitting: Cardiology

## 2021-09-24 DIAGNOSIS — G4733 Obstructive sleep apnea (adult) (pediatric): Secondary | ICD-10-CM | POA: Diagnosis not present

## 2021-09-24 DIAGNOSIS — H5202 Hypermetropia, left eye: Secondary | ICD-10-CM | POA: Diagnosis not present

## 2021-09-25 ENCOUNTER — Ambulatory Visit: Payer: Medicare HMO | Attending: Cardiology

## 2021-09-25 DIAGNOSIS — I10 Essential (primary) hypertension: Secondary | ICD-10-CM

## 2021-09-25 DIAGNOSIS — G4733 Obstructive sleep apnea (adult) (pediatric): Secondary | ICD-10-CM

## 2021-09-25 NOTE — Procedures (Signed)
SLEEP STUDY REPORT Patient Information Study Date: 09/24/21 Patient Name: Wayne [name] Patient ID: [id number] Birth Date: Jan 25, 2050 Age: 71 Gender: Male BMI: 32.8 (W=229 lb, H=5' 10'') Stopbang: 5 Referring Physician: Fransico Him, MD  TEST DESCRIPTION:  Home sleep apnea testing was completed using the WatchPat, a Type 1 device using his Inspire device as treatment for his OSA, utilizing peripheral arterial tonometry (PAT), chest movement, actigraphy, pulse oximetry, pulse rate, body position and snore.  AHI was calculated with apnea and hypopnea using valid sleep time as the denominator. RDI includes apneas, hypopneas, and RERAs.  The data acquired and the scoring of sleep and all associated events were performed in accordance with the recommended standards and specifications as outlined in the AASM Manual for the Scoring of Sleep and Associated Events 2.2.0 (2015).  FINDINGS: 1.  Moderate Obstructive Sleep Apnea with AHI 22.9/hr.  2.  Mild Central Sleep Apnea with pAHIc 9.8/hr qirh 15.5% Cheyne Stoke respirations. 3.  Oxygen desaturations as low as 70%. 4.  Severe snoring was present. O2 sats were < 88% for 24.3 min. 5.  Total sleep time was 5 hrs and 35 min. 6.  18% of total sleep time was spent in REM sleep.  7.  Normal sleep onset latency at 19 min 8.  Normal REM sleep onset latency at 96 min.  9.  Total awakenings were 20.   DIAGNOSIS:   Moderate Obstructive Sleep Apnea (G47.33) Mild Central Sleep Apnea Nocturnal Hypoxemia  RECOMMENDATIONS:   1.  Clinical correlation of these findings is necessary.  The decision to treat obstructive sleep apnea (OSA) is usually based on the presence of apnea symptoms or the presence of associated medical conditions such as Hypertension, Congestive Heart Failure, Atrial Fibrillation or Obesity.  The most common symptoms of OSA are snoring, gasping for breath while sleeping, daytime sleepiness and fatigue.   2.  Initiating apnea therapy is  recommended given the presence of symptoms and/or associated conditions. Recommend proceeding with one of the following:     a.  Auto-CPAP therapy with a pressure range of 5-20cm H2O.     b.  An oral appliance (OA) that can be obtained from certain dentists with expertise in sleep medicine.  These are primarily of use in non-obese patients with mild and moderate disease.     c.  An ENT consultation which may be useful to look for specific causes of obstruction and possible treatment options.     d.  If patient is intolerant to PAP therapy, consider referral to ENT for evaluation for hypoglossal nerve stimulator.   3.  Close follow-up is necessary to ensure success with CPAP or oral appliance therapy for maximum benefit.  4.  A follow-up oximetry study on CPAP is recommended to assess the adequacy of therapy and determine the need for supplemental oxygen or the potential need for Bi-level therapy.  An arterial blood gas to determine the adequacy of baseline ventilation and oxygenation should also be considered.  5.  Healthy sleep recommendations include:  adequate nightly sleep (normal 7-9 hrs/night), avoidance of caffeine after noon and alcohol near bedtime, and maintaining a sleep environment that is cool, dark and quiet.  6.  Weight loss for overweight patients is recommended.  Even modest amounts of weight loss can significantly improve the severity of sleep apnea.  7.  Snoring recommendations include:  weight loss where appropriate, side sleeping, and avoidance of alcohol before bed.  8.  Operation of motor vehicle should not be performed when  sleepy.  Signature: Fransico Him, MD; [facility]; [place], [place] Board of Sleep Medicine Electronically Signed: 09/25/21

## 2021-10-01 ENCOUNTER — Telehealth: Payer: Self-pay

## 2021-10-01 NOTE — Telephone Encounter (Signed)
The patient has been notified of the result and verbalized understanding.  All questions (if any) were answered. Antonieta Iba, RN 10/01/2021 5:02 PM  Patient is scheduled for an appointment  9/21.

## 2021-10-01 NOTE — Telephone Encounter (Signed)
-----   Message from Sueanne Margarita, MD sent at 09/25/2021  2:18 PM EDT ----- Patient still having significant OSA  and hypoxemia on Inspire device - please get in to office with Inspire rep for further reprogramming

## 2021-10-04 ENCOUNTER — Encounter: Payer: Self-pay | Admitting: Cardiology

## 2021-10-04 ENCOUNTER — Ambulatory Visit: Payer: Medicare HMO | Attending: Cardiology | Admitting: Cardiology

## 2021-10-04 VITALS — BP 110/64 | HR 58 | Ht 70.0 in | Wt 229.6 lb

## 2021-10-04 DIAGNOSIS — I10 Essential (primary) hypertension: Secondary | ICD-10-CM | POA: Diagnosis not present

## 2021-10-04 DIAGNOSIS — G4733 Obstructive sleep apnea (adult) (pediatric): Secondary | ICD-10-CM

## 2021-10-04 NOTE — Patient Instructions (Signed)
Medication Instructions:  Your physician recommends that you continue on your current medications as directed. Please refer to the Current Medication list given to you today.  *If you need a refill on your cardiac medications before your next appointment, please call your pharmacy*  Testing/Procedures: Your physician has recommended that you have a sleep study. This test records several body functions during sleep, including: brain activity, eye movement, oxygen and carbon dioxide blood levels, heart rate and rhythm, breathing rate and rhythm, the flow of air through your mouth and nose, snoring, body muscle movements, and chest and belly movement.   Follow-Up: At [facility], you and your health needs are our priority.  As part of our continuing mission to provide you with exceptional heart care, we have created designated Provider Care Teams.  These Care Teams include your primary Cardiologist (physician) and Advanced Practice Providers (APPs -  Physician Assistants and Nurse Practitioners) who all work together to provide you with the care you need, when you need it.  Follow up with Dr. Radford Pax after sleep study  Important Information About Sugar

## 2021-10-04 NOTE — Progress Notes (Signed)
Date:  10/04/2021   ID:  Wayne [name], DOB [birth year], MRN [id number] The patient was identified using 2 identifiers.  PCP:  Gaynelle Arabian, MD   [facility] Providers Cardiologist:  Larae Grooms, MD     Evaluation Performed:  Follow-Up Visit  Chief Complaint:  OSA  History of Present Illness:    Wayne [name] is a 71 y.o. male with a hx of ASCAD s/p PCI in 2011, HLD, LBBB and hx of OSA but was not wearing his CPAP.  He had been followed by Dr. Radene Gunning years ago but did not tolerate the CPAP and stopped using it  He was referred for sleep study by Dr. Irish Lack.  He underwent a home sleep study in 12/2019 showing severe OSA with an AHI of 50.7/hr and nocturnal hypoxemia with O2 sats as low as 40%. He underwent unsuccessful CPAP titration due to ongoing respiratory events and was placed on BiPAP at 20/16cm H2O.    Unfortunately he was intolerant to CPAP therapy because he could not tolerate the mask.  He was referred to ENT and underwent hypoglossal nerve stimulation implant in March 2023.  At his last office visit he was doing well with his inspire device and was up to level 9 which was 1.7 V.  He underwent in lab inspire titration with a therapeutic output of 2.6 V with an AHI of 7.5 hours and lowest O2 saturation 89% at this level.  Final settings made were an output of 1.6 V to 2.6 V.  In lab inspire titration 09/05/2021 sleep lab with AHI 7.5/h at a maximum amplitude of 2.6 V and O2 saturation minimum 89%.  Therapeutic amplitude found to be 2.6 V.  His device was set at an output range of 1.6 to 2.6 V.  Office visit 09/19/2021 he was at level 6 which is 2.1 V he is having sore throat and some tongue sensitivity and in looking at his sleep study recently done his AHI was actually only 3.7/h at a voltage of 1.6 V but O2 saturations were as low as 85% so device was interrogated and changed the minimum output range to 1.4 V which is 0.2 V below his functional threshold of 1.6 V during sleep  study where his AHI was only 3.7/h and new output range was changed to 1.4 to 1.8 V with a goal of reaching level 3 which is 1.6 V  WatchPAT home sleep study 09/24/2021 with an done on output changes and showed moderate obstructive sleep apnea with an AHI of 22.9/h, mild central sleep apnea with an AHI of 9.8/h with 15.5% of time in Cheyne-Stokes respirations as well as ongoing nocturnal hypoxemia.  He is now back today for evaluation with the inspire rep.  He is doing well with his device.  He is sleeping 6 to 7 hours a night.  He is currently on level 3 which is 1.6 V.  He has no tongue sensitivity.  He tells me that when he did his home sleep study he actually had not taken his Ambien because he ran out.  He also laid on his back all night which he never done as he always sleeps on his side so he feels it was not an accurate study.  This likely is true given that he did well in this the sleep lab with his inspire titration.   Past Medical History:  Diagnosis Date   Arthritis    Basal cell carcinoma    MOHS   Coronary  artery disease 09/2009   S/P MI and PTCA--X 4 STENTS   Depression    situational   Diverticulosis    Hip pain    CHRONIC   Hyperlipidemia    Hypertension    LBBB (left bundle branch block)    Myocardial infarction ([place])    Neuropathy    IDIOPATHIC   Neuropathy    Sleep apnea 11/22/2016   does not wear CPAP   Past Surgical History:  Procedure Laterality Date   CARDIAC CATHETERIZATION  09/11   CHOLECYSTECTOMY     DRUG INDUCED ENDOSCOPY N/A 01/17/2021   Procedure: DRUG INDUCED SLEEP ENDOSCOPY;  Surgeon: Jerrell Belfast, MD;  Location: [place];  Service: ENT;  Laterality: N/A;   IMPLANTATION OF HYPOGLOSSAL NERVE STIMULATOR Right 04/13/2021   Procedure: IMPLANTATION OF HYPOGLOSSAL NERVE STIMULATOR;  Surgeon: Jerrell Belfast, MD;  Location: [facility] OR;  Service: ENT;  Laterality: Right;   JOINT REPLACEMENT  11/22/2016   right total knee 10/2016   LUMBAR  LAMINECTOMY/DECOMPRESSION MICRODISCECTOMY Left 11/26/2016   Procedure: Laminectomy for facet/synovial cyst - left - Lumbar four-Lumbar five;  Surgeon: Earnie Larsson, MD;  Location: [place];  Service: Neurosurgery;  Laterality: Left;   RIGHT SHOULDER BONE TUMOR     SLIPPED CAPITAL FEMORAL EPIPHYSIS PINNING       Current Meds  Medication Sig   clopidogrel (PLAVIX) 75 MG tablet Take 1 tablet (75 mg total) by mouth daily.   cyclobenzaprine (FLEXERIL) 10 MG tablet Take 10 mg by mouth 3 (three) times daily as needed for muscle spasms.   diclofenac Sodium (VOLTAREN) 1 % GEL Apply 1 application. topically 2 (two) times daily as needed (pain).   docusate sodium (COLACE) 100 MG capsule Take 100 mg by mouth daily as needed for moderate constipation.   DULoxetine (CYMBALTA) 60 MG capsule Take 60 mg by mouth 2 (two) times daily.   lisinopril (ZESTRIL) 5 MG tablet Take 1 tablet (5 mg total) by mouth daily.   metoprolol succinate (TOPROL-XL) 25 MG 24 hr tablet TAKE 1/2 TABLET BY MOUTH TWO TIMES A DAY   nitroGLYCERIN (NITROSTAT) 0.4 MG SL tablet Dissolve one tablet under tongue every 5 minutes as needed for chest pain; up to 3 doses--call 911 if pain persist   Omega-3 Fatty Acids (FISH OIL) 1000 MG CAPS Take 2 capsules (2,000 mg total) by mouth daily.   oxyCODONE-acetaminophen (PERCOCET) 10-325 MG per tablet Take 1 tablet every 6 (six) hours as needed by mouth for pain.    pregabalin (LYRICA) 150 MG capsule Take 150 mg by mouth 2 (two) times daily.   Respiratory Therapy Supplies (CARETOUCH CPAP & BIPAP HOSE) MISC See admin instructions.   rosuvastatin (CRESTOR) 40 MG tablet TAKE ONE TABLET BY MOUTH EVERY EVENING   zolpidem (AMBIEN) 10 MG tablet Take 10 mg by mouth at bedtime.     Allergies:   Trazodone and nefazodone   Social History   Tobacco Use   Smoking status: Never   Smokeless tobacco: Never  Vaping Use   Vaping Use: Never used  Substance Use Topics   Alcohol use: No   Drug use: No     Family  Hx: The patient's family history includes Cancer in his mother; Lung cancer in his father.  ROS:   Please see the history of present illness.     All other systems reviewed and are negative.   Prior CV studies:   The following studies were reviewed today:  None  Labs/Other Tests and Data Reviewed:  EKG:  No ECG reviewed.  Recent Labs: 04/13/2021: BUN 17; Creatinine, Ser 0.76; Hemoglobin 12.4; Platelets 186; Potassium 4.0; Sodium 134   Recent Lipid Panel Lab Results  Component Value Date/Time   CHOL 128 07/25/2014 08:06 AM   TRIG 94.0 07/25/2014 08:06 AM   HDL 38.70 (L) 07/25/2014 08:06 AM   CHOLHDL 3 07/25/2014 08:06 AM   LDLCALC 71 07/25/2014 08:06 AM   LDLDIRECT 76.8 02/22/2013 07:48 AM    Wt Readings from Last 3 Encounters:  10/04/21 229 lb 9.6 oz (104.1 kg)  09/19/21 230 lb (104.3 kg)  09/05/21 226 lb (102.5 kg)    STOP-Bang Score:  5      Objective:    Vital Signs:  BP 110/64   Pulse (!) 58   Ht '5\' 10"'$  (1.778 m)   Wt 229 lb 9.6 oz (104.1 kg)   SpO2 91%   BMI 32.94 kg/m   Well nourished, well developed male in no acute distress. Well appearing, alert and conversant, regular work of breathing,  good skin color  Eyes- anicteric mouth- oral mucosa is pink  neuro- grossly intact skin- no apparent rash or lesions or cyanosis   ASSESSMENT & PLAN:    OSA  -Unfortunately he did not tolerate CPAP therapy due to inability to tolerate the mask  -He was evaluated by ENT and on 04/13/2021 underwent inspire hypoglossal nerve stimulator implantation -Office visit 05/22/2021 device was activated and interrogated at last office visit with functional threshold  0.9V. -Final settings at last office visit:  -Amplitude 0.9V  -Patient control 0.9 to 1.9V  -Pulse width 90 micro sec  -Rate '33Hz'$   -Start delay 35 min  -Pause time 15 min  -Therapy duration 8hr  -Electrodes +-+ -office visit 07/30/2021 his device was interrogated and he was on level 9 which is 1.7V -In lab  inspire titration 09/05/2021 sleep lab with AHI 7.5/h at a maximum amplitude of 2.6 V and O2 saturation minimum 89%.  Therapeutic amplitude found to be 2.6 V.  His device was set at an output range of 1.6 to 2.6 V. -Office visit 09/19/2021 he was at level 6 which is 2.1 V he is having sore throat and some tongue sensitivity and in looking at his sleep study recently done his AHI was actually only 3.7/h at a voltage of 1.6 V but O2 saturations were as low as 85% so device was interrogated and ollowing changes were made:  -Changed the minimum output range to 1.4 V which is 0.2 V below his functional threshold of 1.6 V during sleep study where his AHI was only 3.7/h  -His new output range is 1.4 to 1.8 V with a goal of reaching level 3 which is 1.6 V -WatchPAT home sleep study 09/24/2021 showed moderate obstructive sleep apnea with an AHI of 22.9/h, mild central sleep apnea with an AHI of 9.8/h with 15.5% of time in Cheyne-Stokes respirations as well as ongoing nocturnal hypoxemia  -Office visit today 10/04/2021 he is at level 3 which is 1.6 V.  What I would like to do is repeat a home sleep study on him since he did not take his Ambien at last study and did not sleep well and laying on his back the entire night which he normally does not do.  Normally he sleeps on his side.  I have told him to to make sure he takes his Ambien the night he does a sleep study and make sure he remains off of his back during the  sleep study and we will see if we need to make any adjustments going further.  If sleep study looks okay then I will see him back in 6 months  Hypertension -BP adequately controlled on exam today continue -Continue prescription drug management lisinopril 5 mg daily Toprol-XL 12.5 mg daily with as needed refills  Tests Ordered: Orders Placed This Encounter  Procedures   [place] Sleep Study     Medication Changes: No orders of the defined types were placed in this encounter.    Follow Up:  Virtual  visit 4 weeks  Signed, Fransico Him, MD  10/04/2021 11:21 AM    [place]

## 2021-10-07 ENCOUNTER — Encounter (HOSPITAL_BASED_OUTPATIENT_CLINIC_OR_DEPARTMENT_OTHER): Payer: Medicare HMO | Admitting: Cardiology

## 2021-10-07 DIAGNOSIS — G4733 Obstructive sleep apnea (adult) (pediatric): Secondary | ICD-10-CM | POA: Diagnosis not present

## 2021-10-10 NOTE — Telephone Encounter (Signed)
Prior Authorization for [facility] sent to [place] via web portal. Tracking Number  [id number] [id number]

## 2021-10-11 ENCOUNTER — Telehealth: Payer: Self-pay

## 2021-10-11 ENCOUNTER — Ambulatory Visit: Payer: Medicare HMO | Attending: Cardiology

## 2021-10-11 DIAGNOSIS — R000 Tachycardia, unspecified: Secondary | ICD-10-CM

## 2021-10-11 DIAGNOSIS — G4733 Obstructive sleep apnea (adult) (pediatric): Secondary | ICD-10-CM

## 2021-10-11 NOTE — Telephone Encounter (Signed)
-----   Message from Sueanne Margarita, MD sent at 10/11/2021  9:45 AM EDT ----- Please get a 2 week Ziopatch to rule out PAF - he had a short run of tachycardia on sleep study and need to rule out PAF

## 2021-10-11 NOTE — Progress Notes (Unsigned)
Enrolled for [employer] to mail a ZIO XT long term holter monitor to the patients address on file.  

## 2021-10-11 NOTE — Procedures (Signed)
   Patient Information Study Date: 10/07/21 Patient Name: Wayne [name] Patient ID: [id number] Birth Date: 12-Mar-2050 Age: 71 Gender:  BMI: 32.8 (W=229 lb, H=5' 10'') Referring Physician: Fransico Him, MD  TEST DESCRIPTION:  Home sleep apnea testing was completed using the WatchPat, a Type 1 device, utilizing peripheral arterial tonometry (PAT), chest movement, actigraphy, pulse oximetry, pulse rate, body position and snore.  AHI was calculated with apnea and hypopnea using valid sleep time as the denominator. RDI includes apneas, hypopneas, and RERAs.  The data acquired and the scoring of sleep and all associated events were performed in accordance with the recommended standards and specifications as outlined in the AASM Manual for the Scoring of Sleep and Associated Events 2.2.0 (2015).  Study was done with patient using his Inspire Device.   FINDINGS:  1.  Moderate Obstructive Sleep Apnea with AHI 19.2/hr.   2.  No significant Central Sleep Apnea with pAHIc 3.9/hr.  3.  Oxygen desaturations as low as 54%.  4.  Moderate snoring was present. O2 sats were < 88% for 37.4 min.  5.  Total sleep time was 6 hrs and 36 min.  6.  22.1% of total sleep time was spent in REM sleep.   7.  Normal sleep onset latency at 17 min  8.  Prolonged REM sleep onset latency at 149 min.   9.  Total awakenings were 13.  10. Arrhythmia detection:  Suggestive of possible brief atrial fibrillation lasting 25 seconds.  This is not diagnostic and further testing with outpatient telemetry monitoring is recommended.  DIAGNOSIS:   Moderate Obstructive Sleep Apnea (G47.33) during use of Hypoglossal Nerve stimulator Nocturnal Hypoxemia  RECOMMENDATIONS:   1.  Clinical correlation of these findings is necessary.  The decision to treat obstructive sleep apnea (OSA) is usually based on the presence of apnea symptoms or the presence of associated medical conditions such as Hypertension, Congestive Heart Failure, Atrial  Fibrillation or Obesity.  The most common symptoms of OSA are snoring, gasping for breath while sleeping, daytime sleepiness and fatigue.   2.  Recommend referral back to sleep clinic for further evaluation and parameter modifications of Inspire device.   3.  Healthy sleep recommendations include:  adequate nightly sleep (normal 7-9 hrs/night), avoidance of caffeine after noon and alcohol near bedtime, and maintaining a sleep environment that is cool, dark and quiet.  4.  Weight loss for overweight patients is recommended.  Even modest amounts of weight loss can significantly improve the severity of sleep apnea.  5.  Snoring recommendations include:  weight loss where appropriate, side sleeping, and avoidance of alcohol before bed.  6.  Operation of motor vehicle should not be performed when sleepy.  7.  If patient has no history of atrial fibrillation then consider outpatient telemetry monitoring for silent atrial fibrillation.   Signature: Fransico Him, MD; [facility]; [place], [place] Board of Sleep Medicine Electronically Signed: 10/11/21

## 2021-10-12 ENCOUNTER — Ambulatory Visit: Payer: Medicare HMO | Attending: Cardiology

## 2021-10-12 DIAGNOSIS — G4733 Obstructive sleep apnea (adult) (pediatric): Secondary | ICD-10-CM

## 2021-10-13 DIAGNOSIS — R000 Tachycardia, unspecified: Secondary | ICD-10-CM | POA: Diagnosis not present

## 2021-10-13 DIAGNOSIS — G4733 Obstructive sleep apnea (adult) (pediatric): Secondary | ICD-10-CM

## 2021-10-15 DIAGNOSIS — G894 Chronic pain syndrome: Secondary | ICD-10-CM | POA: Diagnosis not present

## 2021-10-15 DIAGNOSIS — R69 Illness, unspecified: Secondary | ICD-10-CM | POA: Diagnosis not present

## 2021-10-15 DIAGNOSIS — G4700 Insomnia, unspecified: Secondary | ICD-10-CM | POA: Diagnosis not present

## 2021-10-15 DIAGNOSIS — G609 Hereditary and idiopathic neuropathy, unspecified: Secondary | ICD-10-CM | POA: Diagnosis not present

## 2021-10-16 NOTE — Telephone Encounter (Signed)
Sleep study has been completed

## 2021-10-19 DIAGNOSIS — R0602 Shortness of breath: Secondary | ICD-10-CM | POA: Diagnosis not present

## 2021-10-23 DIAGNOSIS — M1712 Unilateral primary osteoarthritis, left knee: Secondary | ICD-10-CM | POA: Diagnosis not present

## 2021-11-01 DIAGNOSIS — M1712 Unilateral primary osteoarthritis, left knee: Secondary | ICD-10-CM | POA: Diagnosis not present

## 2021-11-06 DIAGNOSIS — R000 Tachycardia, unspecified: Secondary | ICD-10-CM | POA: Diagnosis not present

## 2021-11-06 DIAGNOSIS — G4733 Obstructive sleep apnea (adult) (pediatric): Secondary | ICD-10-CM | POA: Diagnosis not present

## 2021-11-08 DIAGNOSIS — M1712 Unilateral primary osteoarthritis, left knee: Secondary | ICD-10-CM | POA: Diagnosis not present

## 2021-11-12 ENCOUNTER — Telehealth: Payer: Self-pay

## 2021-11-12 MED ORDER — METOPROLOL SUCCINATE ER 25 MG PO TB24: ORAL_TABLET | ORAL | 3 refills | Status: DC

## 2021-11-12 NOTE — Telephone Encounter (Signed)
Patient notified via My Chart

## 2021-11-12 NOTE — Telephone Encounter (Signed)
-----   Message from Sueanne Margarita, MD sent at 11/06/2021  5:46 PM EDT ----- Heart monitor showed several runs of a fast heartbeat from the top of the heart called atrial tachycardia but no evidence of atrial fibrillation.  Recommend increasing Toprol to 25 mg every morning and 12.5 mg every afternoon.  Follow-up with PA in 4 weeks.

## 2021-11-19 DIAGNOSIS — G609 Hereditary and idiopathic neuropathy, unspecified: Secondary | ICD-10-CM | POA: Diagnosis not present

## 2021-11-19 DIAGNOSIS — G4700 Insomnia, unspecified: Secondary | ICD-10-CM | POA: Diagnosis not present

## 2021-11-19 DIAGNOSIS — G894 Chronic pain syndrome: Secondary | ICD-10-CM | POA: Diagnosis not present

## 2021-11-19 DIAGNOSIS — F329 Major depressive disorder, single episode, unspecified: Secondary | ICD-10-CM | POA: Diagnosis not present

## 2021-11-19 DIAGNOSIS — R69 Illness, unspecified: Secondary | ICD-10-CM | POA: Diagnosis not present

## 2021-11-19 DIAGNOSIS — Z79891 Long term (current) use of opiate analgesic: Secondary | ICD-10-CM | POA: Diagnosis not present

## 2021-11-21 DIAGNOSIS — M1711 Unilateral primary osteoarthritis, right knee: Secondary | ICD-10-CM | POA: Diagnosis not present

## 2021-11-21 DIAGNOSIS — Z96642 Presence of left artificial hip joint: Secondary | ICD-10-CM | POA: Diagnosis not present

## 2021-11-21 DIAGNOSIS — Z96651 Presence of right artificial knee joint: Secondary | ICD-10-CM | POA: Diagnosis not present

## 2021-11-27 ENCOUNTER — Ambulatory Visit: Payer: Medicare HMO | Attending: Cardiology | Admitting: Cardiology

## 2021-11-27 ENCOUNTER — Encounter: Payer: Self-pay | Admitting: Cardiology

## 2021-11-27 VITALS — BP 128/74 | HR 60 | Ht 69.5 in | Wt 235.0 lb

## 2021-11-27 DIAGNOSIS — G4733 Obstructive sleep apnea (adult) (pediatric): Secondary | ICD-10-CM | POA: Diagnosis not present

## 2021-11-27 DIAGNOSIS — I10 Essential (primary) hypertension: Secondary | ICD-10-CM

## 2021-11-27 DIAGNOSIS — I4719 Other supraventricular tachycardia: Secondary | ICD-10-CM

## 2021-11-27 DIAGNOSIS — I480 Paroxysmal atrial fibrillation: Secondary | ICD-10-CM | POA: Diagnosis not present

## 2021-11-27 NOTE — Patient Instructions (Signed)
Medication Instructions:  Your physician recommends that you continue on your current medications as directed. Please refer to the Current Medication list given to you today.  *If you need a refill on your cardiac medications before your next appointment, please call your pharmacy*   Follow-Up: At [facility], you and your health needs are our priority.  As part of our continuing mission to provide you with exceptional heart care, we have created designated Provider Care Teams.  These Care Teams include your primary Cardiologist (physician) and Advanced Practice Providers (APPs -  Physician Assistants and Nurse Practitioners) who all work together to provide you with the care you need, when you need it.  We recommend signing up for the patient portal called "MyChart".  Sign up information is provided on this After Visit Summary.  MyChart is used to connect with patients for Virtual Visits (Telemedicine).  Patients are able to view lab/test results, encounter notes, upcoming appointments, etc.  Non-urgent messages can be sent to your provider as well.   To learn more about what you can do with MyChart, go to [email or web address].    Your next appointment:   4 week(s)  The format for your next appointment:   Virtual Visit   Provider:   Dr. Radford Pax   Important Information About Sugar

## 2021-11-27 NOTE — Progress Notes (Addendum)
Date:  11/27/2021   ID:  Wayne [name], DOB 02-26-1950, MRN [id number] The patient was identified using 2 identifiers.  PCP:  Gaynelle Arabian, MD   [facility] Providers Cardiologist:  Larae Grooms, MD Sleep Medicine:  Fransico Him, MD     Evaluation Performed:  Follow-Up Visit  Chief Complaint:  OSA  History of Present Illness:    Wayne [name] is a 71 y.o. male with a hx of ASCAD s/p PCI in 2011, HLD, LBBB and hx of OSA but was not wearing his CPAP.  He had been followed by Dr. Radene Gunning years ago but did not tolerate the CPAP and stopped using it  He was referred for sleep study by Dr. Irish Lack.  He underwent a home sleep study in 12/2019 showing severe OSA with an AHI of 50.7/hr and nocturnal hypoxemia with O2 sats as low as 40%. He underwent unsuccessful CPAP titration due to ongoing respiratory events and was placed on BiPAP at 20/16cm H2O.    Unfortunately he was intolerant to CPAP therapy because he could not tolerate the mask.  He was referred to ENT and underwent hypoglossal nerve stimulation implant in March 2023.  At his last office visit he was doing well with his inspire device and was up to level 9 which was 1.7 V.  He underwent in lab inspire titration with a therapeutic output of 2.6 V with an AHI of 7.5 hours and lowest O2 saturation 89% at this level.  Final settings made were an output of 1.6 V to 2.6 V.  In lab inspire titration 09/05/2021 sleep lab with AHI 7.5/h at a maximum amplitude of 2.6 V and O2 saturation minimum 89%.  Therapeutic amplitude found to be 2.6 V.  His device was set at an output range of 1.6 to 2.6 V.  Office visit 09/19/2021 he was at level 6 which is 2.1 V he is having sore throat and some tongue sensitivity and in looking at his sleep study recently done his AHI was actually only 3.7/h at a voltage of 1.6 V but O2 saturations were as low as 85% so device was interrogated and changed the minimum output range to 1.4 V which is 0.2 V below his  functional threshold of 1.6 V during sleep study where his AHI was only 3.7/h and new output range was changed to 1.4 to 1.8 V with a goal of reaching level 3 which is 1.6 V  WatchPAT home sleep study 09/24/2021 with an done on output changes and showed moderate obstructive sleep apnea with an AHI of 22.9/h, mild central sleep apnea with an AHI of 9.8/h with 15.5% of time in Cheyne-Stokes respirations as well as ongoing nocturnal hypoxemia.  At last Office visit 10/04/2021 he was doing well and he was at level 3 which was 1.6 V.  HST done 10/07/21 showed moderate OSA at 19.2/hr on level 3 at 1.6V with moderate snoring with equal events regardless of sleeping position.  He is now back for further evaluation with inspire rep given his significantly elevated AHI.   Past Medical History:  Diagnosis Date   Arthritis    Basal cell carcinoma    MOHS   Coronary artery disease 09/2009   S/P MI and PTCA--X 4 STENTS   Depression    situational   Diverticulosis    Hip pain    CHRONIC   Hyperlipidemia    Hypertension    LBBB (left bundle branch block)    Myocardial infarction ([place])  Neuropathy    IDIOPATHIC   Neuropathy    Sleep apnea 11/22/2016   does not wear CPAP   Past Surgical History:  Procedure Laterality Date   CARDIAC CATHETERIZATION  09/11   CHOLECYSTECTOMY     DRUG INDUCED ENDOSCOPY N/A 01/17/2021   Procedure: DRUG INDUCED SLEEP ENDOSCOPY;  Surgeon: Jerrell Belfast, MD;  Location: [place];  Service: ENT;  Laterality: N/A;   IMPLANTATION OF HYPOGLOSSAL NERVE STIMULATOR Right 04/13/2021   Procedure: IMPLANTATION OF HYPOGLOSSAL NERVE STIMULATOR;  Surgeon: Jerrell Belfast, MD;  Location: [place];  Service: ENT;  Laterality: Right;   JOINT REPLACEMENT  11/22/2016   right total knee 10/2016   LUMBAR LAMINECTOMY/DECOMPRESSION MICRODISCECTOMY Left 11/26/2016   Procedure: Laminectomy for facet/synovial cyst - left - Lumbar four-Lumbar five;  Surgeon: Earnie Larsson, MD;  Location: [place];   Service: Neurosurgery;  Laterality: Left;   RIGHT SHOULDER BONE TUMOR     SLIPPED CAPITAL FEMORAL EPIPHYSIS PINNING       Current Meds  Medication Sig   clopidogrel (PLAVIX) 75 MG tablet Take 1 tablet (75 mg total) by mouth daily.   cyclobenzaprine (FLEXERIL) 10 MG tablet Take 10 mg by mouth 3 (three) times daily as needed for muscle spasms.   diclofenac Sodium (VOLTAREN) 1 % GEL Apply 1 application. topically 2 (two) times daily as needed (pain).   docusate sodium (COLACE) 100 MG capsule Take 100 mg by mouth daily as needed for moderate constipation.   DULoxetine (CYMBALTA) 60 MG capsule Take 60 mg by mouth 2 (two) times daily.   lisinopril (ZESTRIL) 5 MG tablet Take 1 tablet (5 mg total) by mouth daily.   metoprolol succinate (TOPROL XL) 25 MG 24 hr tablet Take 1 tablet (25 mg total) every morning and 1/2 tablet (12.5 mg total) every evening.   nitroGLYCERIN (NITROSTAT) 0.4 MG SL tablet Dissolve one tablet under tongue every 5 minutes as needed for chest pain; up to 3 doses--call 911 if pain persist   Omega-3 Fatty Acids (FISH OIL) 1000 MG CAPS Take 2 capsules (2,000 mg total) by mouth daily.   oxyCODONE-acetaminophen (PERCOCET) 10-325 MG per tablet Take 1 tablet every 6 (six) hours as needed by mouth for pain.    pregabalin (LYRICA) 150 MG capsule Take 150 mg by mouth 2 (two) times daily.   Respiratory Therapy Supplies (CARETOUCH CPAP & BIPAP HOSE) MISC See admin instructions.   rosuvastatin (CRESTOR) 40 MG tablet TAKE ONE TABLET BY MOUTH EVERY EVENING   zolpidem (AMBIEN) 10 MG tablet Take 10 mg by mouth at bedtime.     Allergies:   Trazodone and nefazodone   Social History   Tobacco Use   Smoking status: Never   Smokeless tobacco: Never  Vaping Use   Vaping Use: Never used  Substance Use Topics   Alcohol use: No   Drug use: No     Family Hx: The patient's family history includes Cancer in his mother; Lung cancer in his father.  ROS:   Please see the history of present  illness.     All other systems reviewed and are negative.   Prior CV studies:   The following studies were reviewed today:  None  Labs/Other Tests and Data Reviewed:    EKG:  Sinus bradycardia at 55bpm with LBBB  Recent Labs: 04/13/2021: BUN 17; Creatinine, Ser 0.76; Hemoglobin 12.4; Platelets 186; Potassium 4.0; Sodium 134   Recent Lipid Panel Lab Results  Component Value Date/Time   CHOL 128 07/25/2014 08:06 AM  TRIG 94.0 07/25/2014 08:06 AM   HDL 38.70 (L) 07/25/2014 08:06 AM   CHOLHDL 3 07/25/2014 08:06 AM   LDLCALC 71 07/25/2014 08:06 AM   LDLDIRECT 76.8 02/22/2013 07:48 AM    Wt Readings from Last 3 Encounters:  11/27/21 235 lb (106.6 kg)  10/04/21 229 lb 9.6 oz (104.1 kg)  09/19/21 230 lb (104.3 kg)    STOP-Bang Score:  5      Objective:    Vital Signs:  BP 128/74   Pulse 60   Ht 5' 9.5" (1.765 m)   Wt 235 lb (106.6 kg)   SpO2 91%   BMI 34.21 kg/m   Well nourished, well developed male in no acute distress. Well appearing, alert and conversant, regular work of breathing,  good skin color  Eyes- anicteric mouth- oral mucosa is pink  neuro- grossly intact skin- no apparent rash or lesions or cyanosis   ASSESSMENT & PLAN:    OSA  -Unfortunately he did not tolerate CPAP therapy due to inability to tolerate the mask  -He was evaluated by ENT and on 04/13/2021 underwent inspire hypoglossal nerve stimulator implantation -Office visit 05/22/2021 device was activated and interrogated at last office visit with functional threshold  0.9V. -Final settings at last office visit:  -Amplitude 0.9V  -Patient control 0.9 to 1.9V  -Pulse width 90 micro sec  -Rate '33Hz'$   -Start delay 35 min  -Pause time 15 min  -Therapy duration 8hr  -Electrodes +-+ -office visit 07/30/2021 his device was interrogated and he was on level 9 which is 1.7V -In lab inspire titration 09/05/2021 sleep lab with AHI 7.5/h at a maximum amplitude of 2.6 V and O2 saturation minimum 89%.   Therapeutic amplitude found to be 2.6 V.  His device was set at an output range of 1.6 to 2.6 V. -Office visit 09/19/2021 he was at level 6 which is 2.1 V he is having sore throat and some tongue sensitivity and in looking at his sleep study recently done his AHI was actually only 3.7/h at a voltage of 1.6 V but O2 saturations were as low as 85% so device was interrogated and ollowing changes were made:  -Changed the minimum output range to 1.4 V which is 0.2 V below his functional threshold of 1.6 V during sleep study where his AHI was only 3.7/h  -His new output range is 1.4 to 1.8 V with a goal of reaching level 3 which is 1.6 V -WatchPAT home sleep study 09/24/2021 showed moderate obstructive sleep apnea with an AHI of 22.9/h, mild central sleep apnea with an AHI of 9.8/h with 15.5% of time in Cheyne-Stokes respirations as well as ongoing nocturnal hypoxemia  -Office visit 10/04/2021 he was at level 3 which was 1.6 V.   -HST done 10/07/21 showed moderate OSA at 19.2/hr on level 3 at 1.6V with moderate snoring with equal events regardless of sleeping position -device interrogated today and demonstrated abnormal tongue movement with an electrode configuration of +/-/+ with tongue moving to the right and curling back to his throat -device reprogrammed to a configuration of -/ -/ - and pause time increased to 30 min.  The tongue threshold is 0.2V and output changed to 0.1-0.4 with PW 210us and start delay 12mn.  -He will try to get to level 4 (0.4V) in the next 4 weeks -will set up 4 week virtual check in and if doing well, will repeat [place] HST  Hypertension -BP is adequately controlled on exam today -Continue prescription drug  with lisinopril 5 mg daily Toprol-XL 12.5 mg daily with as needed refills  Paroxysmal atrial tachycardia -noted to have atrial arrhythmias on HST -heart monitor showed runs of nonsustained atrial tachycardia -palpitations have resolved after increasing Toprol XL to '25mg'$  qam  and 12.'5mg'$  qpm Tests Ordered: Orders Placed This Encounter  Procedures   EKG 12-Lead     Medication Changes: No orders of the defined types were placed in this encounter.    Follow Up:  Virtual visit 4 weeks  Signed, Fransico Him, MD  11/27/2021 3:04 PM    [place]

## 2021-11-29 ENCOUNTER — Encounter: Payer: Self-pay | Admitting: Interventional Cardiology

## 2021-11-30 NOTE — Telephone Encounter (Signed)
OK to order 2 week Zio patch.  Palpitations. JV

## 2021-12-18 ENCOUNTER — Encounter: Payer: Self-pay | Admitting: Cardiology

## 2021-12-24 DIAGNOSIS — G4700 Insomnia, unspecified: Secondary | ICD-10-CM | POA: Diagnosis not present

## 2021-12-24 DIAGNOSIS — G609 Hereditary and idiopathic neuropathy, unspecified: Secondary | ICD-10-CM | POA: Diagnosis not present

## 2021-12-24 DIAGNOSIS — G894 Chronic pain syndrome: Secondary | ICD-10-CM | POA: Diagnosis not present

## 2021-12-24 DIAGNOSIS — R69 Illness, unspecified: Secondary | ICD-10-CM | POA: Diagnosis not present

## 2021-12-26 NOTE — Progress Notes (Signed)
Date:  12/27/2021   ID:  [name], DOB 1950-12-30, MRN [id number] The patient was identified using 2 identifiers.  PCP:  Gaynelle Arabian, MD   [facility] Providers Cardiologist:  Larae Grooms, MD Sleep Medicine:  Fransico Him, MD     Evaluation Performed:  Follow-Up Visit  Chief Complaint:  OSA  History of Present Illness:    Wayne [name] is a 71 y.o. male with a hx of ASCAD s/p PCI in 2011, HLD, LBBB and hx of OSA but was not wearing his CPAP.  He had been followed by Dr. Radene Gunning years ago but did not tolerate the CPAP and stopped using it  He was referred for sleep study by Dr. Irish Lack.  He underwent a home sleep study in 12/2019 showing severe OSA with an AHI of 50.7/hr and nocturnal hypoxemia with O2 sats as low as 40%. He underwent unsuccessful CPAP titration due to ongoing respiratory events and was placed on BiPAP at 20/16cm H2O.    Unfortunately he was intolerant to CPAP therapy because he could not tolerate the mask.  He was referred to ENT and underwent hypoglossal nerve stimulation implant in March 2023.  At his last office visit he was doing well with his inspire device and was up to level 9 which was 1.7 V.  He underwent in lab inspire titration with a therapeutic output of 2.6 V with an AHI of 7.5 hours and lowest O2 saturation 89% at this level.  Final settings made were an output of 1.6 V to 2.6 V.  In lab inspire titration 09/05/2021 sleep lab with AHI 7.5/h at a maximum amplitude of 2.6 V and O2 saturation minimum 89%.  Therapeutic amplitude found to be 2.6 V.  His device was set at an output range of 1.6 to 2.6 V.  Office visit 09/19/2021 he was at level 6 which is 2.1 V he is having sore throat and some tongue sensitivity and in looking at his sleep study recently done his AHI was actually only 3.7/h at a voltage of 1.6 V but O2 saturations were as low as 85% so device was interrogated and changed the minimum output range to 1.4 V which is 0.2 V below his  functional threshold of 1.6 V during sleep study where his AHI was only 3.7/h and new output range was changed to 1.4 to 1.8 V with a goal of reaching level 3 which is 1.6 V  WatchPAT home sleep study 09/24/2021 with an done on output changes and showed moderate obstructive sleep apnea with an AHI of 22.9/h, mild central sleep apnea with an AHI of 9.8/h with 15.5% of time in Cheyne-Stokes respirations as well as ongoing nocturnal hypoxemia.  Office visit 10/04/2021 he was doing well and he was at level 3 which was 1.6 V.  HST done 10/07/21 showed moderate OSA at 19.2/hr on level 3 at 1.6V with moderate snoring with equal events regardless of sleeping position.    OV 11/27/2021 his HST showed moderate OSA with AHI 19.2/hr on level 3 and 1.6V with moderate snoring.  He was found to have significant backward tongue curling on exam in configuration  +/-/+ with tongue moving to the right and curling back to his throat and device reprogrammed to a configuration of -/ -/ - and pause time increased to 30 min.  The tongue threshold is 0.2V and output changed to 0.1-0.4 with PW 210us and start delay 63mn.   He is now here for 4 week check  in and is doing well.  He says that he still wakes up at night to go to the bathroom.  He is sleeping 7-8 hours nightly and feels resting in the am.  He will take a nap in the afternoon and use it  then as well. His snoring has improved according to his wife but still snores some.  He is on level 4 .  He has no tongue discomfort.    Past Medical History:  Diagnosis Date   Arthritis    Basal cell carcinoma    MOHS   Coronary artery disease 09/2009   S/P MI and PTCA--X 4 STENTS   Depression    situational   Diverticulosis    Hip pain    CHRONIC   Hyperlipidemia    Hypertension    LBBB (left bundle branch block)    Myocardial infarction ([facility])    Neuropathy    IDIOPATHIC   Neuropathy    PAT (paroxysmal atrial tachycardia)    Sleep apnea 11/22/2016   does not wear  CPAP   Past Surgical History:  Procedure Laterality Date   CARDIAC CATHETERIZATION  09/11   CHOLECYSTECTOMY     DRUG INDUCED ENDOSCOPY N/A 01/17/2021   Procedure: DRUG INDUCED SLEEP ENDOSCOPY;  Surgeon: Jerrell Belfast, MD;  Location: [place];  Service: ENT;  Laterality: N/A;   IMPLANTATION OF HYPOGLOSSAL NERVE STIMULATOR Right 04/13/2021   Procedure: IMPLANTATION OF HYPOGLOSSAL NERVE STIMULATOR;  Surgeon: Jerrell Belfast, MD;  Location: [place];  Service: ENT;  Laterality: Right;   JOINT REPLACEMENT  11/22/2016   right total knee 10/2016   LUMBAR LAMINECTOMY/DECOMPRESSION MICRODISCECTOMY Left 11/26/2016   Procedure: Laminectomy for facet/synovial cyst - left - Lumbar four-Lumbar five;  Surgeon: Earnie Larsson, MD;  Location: [place];  Service: Neurosurgery;  Laterality: Left;   RIGHT SHOULDER BONE TUMOR     SLIPPED CAPITAL FEMORAL EPIPHYSIS PINNING       Current Meds  Medication Sig   clopidogrel (PLAVIX) 75 MG tablet Take 1 tablet (75 mg total) by mouth daily.   cyclobenzaprine (FLEXERIL) 10 MG tablet Take 10 mg by mouth 3 (three) times daily as needed for muscle spasms.   diclofenac Sodium (VOLTAREN) 1 % GEL Apply 1 application. topically 2 (two) times daily as needed (pain).   docusate sodium (COLACE) 100 MG capsule Take 100 mg by mouth daily as needed for moderate constipation.   DULoxetine (CYMBALTA) 60 MG capsule Take 60 mg by mouth 2 (two) times daily.   lisinopril (ZESTRIL) 5 MG tablet Take 1 tablet (5 mg total) by mouth daily.   metoprolol succinate (TOPROL XL) 25 MG 24 hr tablet Take 1 tablet (25 mg total) every morning and 1/2 tablet (12.5 mg total) every evening.   nitroGLYCERIN (NITROSTAT) 0.4 MG SL tablet Dissolve one tablet under tongue every 5 minutes as needed for chest pain; up to 3 doses--call 911 if pain persist   Omega-3 Fatty Acids (FISH OIL) 1000 MG CAPS Take 2 capsules (2,000 mg total) by mouth daily.   oxyCODONE-acetaminophen (PERCOCET) 10-325 MG per tablet Take 1 tablet  every 6 (six) hours as needed by mouth for pain.    pregabalin (LYRICA) 150 MG capsule Take 150 mg by mouth 2 (two) times daily.   Respiratory Therapy Supplies (CARETOUCH CPAP & BIPAP HOSE) MISC See admin instructions.   rosuvastatin (CRESTOR) 40 MG tablet TAKE ONE TABLET BY MOUTH EVERY EVENING   zolpidem (AMBIEN) 10 MG tablet Take 10 mg by mouth at bedtime.  Allergies:   Trazodone and nefazodone   Social History   Tobacco Use   Smoking status: Never   Smokeless tobacco: Never  Vaping Use   Vaping Use: Never used  Substance Use Topics   Alcohol use: No   Drug use: No     Family Hx: The patient's family history includes Cancer in his mother; Lung cancer in his father.  ROS:   Please see the history of present illness.     All other systems reviewed and are negative.   Prior CV studies:   The following studies were reviewed today:  None  Labs/Other Tests and Data Reviewed:    EKG:  Sinus bradycardia at 55bpm with LBBB  Recent Labs: 04/13/2021: BUN 17; Creatinine, Ser 0.76; Hemoglobin 12.4; Platelets 186; Potassium 4.0; Sodium 134   Recent Lipid Panel Lab Results  Component Value Date/Time   CHOL 128 07/25/2014 08:06 AM   TRIG 94.0 07/25/2014 08:06 AM   HDL 38.70 (L) 07/25/2014 08:06 AM   CHOLHDL 3 07/25/2014 08:06 AM   LDLCALC 71 07/25/2014 08:06 AM   LDLDIRECT 76.8 02/22/2013 07:48 AM    Wt Readings from Last 3 Encounters:  11/27/21 235 lb (106.6 kg)  10/04/21 229 lb 9.6 oz (104.1 kg)  09/19/21 230 lb (104.3 kg)    STOP-Bang Score:  5      Objective:    Vital Signs:  Ht 5' 9.5" (1.765 m)   BMI 34.21 kg/m   Well nourished, well developed male in no acute distress. Well appearing, alert and conversant, regular work of breathing,  good skin color  Eyes- anicteric mouth- oral mucosa is pink  neuro- grossly intact skin- no apparent rash or lesions or cyanosis ASSESSMENT & PLAN:    OSA  -Unfortunately he did not tolerate CPAP therapy due to  inability to tolerate the mask  -He was evaluated by ENT and on 04/13/2021 underwent inspire hypoglossal nerve stimulator implantation -Office visit 05/22/2021 device was activated and interrogated at last office visit with functional threshold  0.9V. -Final settings at last office visit:  -Amplitude 0.9V  -Patient control 0.9 to 1.9V  -Pulse width 90 micro sec  -Rate '33Hz'$   -Start delay 35 min  -Pause time 15 min  -Therapy duration 8hr  -Electrodes +-+ -office visit 07/30/2021 his device was interrogated and he was on level 9 which is 1.7V -In lab inspire titration 09/05/2021 sleep lab with AHI 7.5/h at a maximum amplitude of 2.6 V and O2 saturation minimum 89%.  Therapeutic amplitude found to be 2.6 V.  His device was set at an output range of 1.6 to 2.6 V. -Office visit 09/19/2021 he was at level 6 which is 2.1 V he is having sore throat and some tongue sensitivity and in looking at his sleep study recently done his AHI was actually only 3.7/h at a voltage of 1.6 V but O2 saturations were as low as 85% so device was interrogated and ollowing changes were made:  -Changed the minimum output range to 1.4 V which is 0.2 V below his functional threshold of 1.6 V during sleep study where his AHI was only 3.7/h  -His new output range is 1.4 to 1.8 V with a goal of reaching level 3 which is 1.6 V -WatchPAT home sleep study 09/24/2021 showed moderate obstructive sleep apnea with an AHI of 22.9/h, mild central sleep apnea with an AHI of 9.8/h with 15.5% of time in Cheyne-Stokes respirations as well as ongoing nocturnal hypoxemia  -Office visit 10/04/2021  he was at level 3 which was 1.6 V.   -HST done 10/07/21 showed moderate OSA at 19.2/hr on level 3 at 1.6V with moderate snoring with equal events regardless of sleeping position -device interrogated 11/27/2021 and demonstrated abnormal tongue movement with an electrode configuration of +/-/+ with tongue moving to the right and curling back to his throat -device  reprogrammed to a configuration of -/ -/ - and pause time increased to 30 min.  The tongue threshold is 0.2V and output changed to 0.1-0.4 with PW 210us and start delay 33mn.  -he is doing well at 4 week checkin and is at level 4  -he is going to try to go up another level -will see me back in office in 4 weeks  Hypertension -BP controlled on exam today -continue prescription drug management with Lisinopril '5mg'$  daily and Toprol XL 12.'5mg'$  daily with PRN refills  Paroxysmal atrial tachycardia -noted to have atrial arrhythmias on HST -heart monitor showed runs of nonsustained atrial tachycardia -his palpitations have pretty much resolved on BB -continue prescription drug management with Toprol XL '25mg'$  daily with PRN refills    Tests Ordered: No orders of the defined types were placed in this encounter.    Medication Changes: No orders of the defined types were placed in this encounter.    Follow Up:  Virtual visit 4 weeks  Signed, TFransico Him MD  12/27/2021 10:02 AM    [place]

## 2021-12-27 ENCOUNTER — Telehealth: Payer: Self-pay

## 2021-12-27 ENCOUNTER — Encounter: Payer: Self-pay | Admitting: Cardiology

## 2021-12-27 ENCOUNTER — Ambulatory Visit: Payer: Medicare HMO | Attending: Cardiology | Admitting: Cardiology

## 2021-12-27 VITALS — Ht 69.5 in

## 2021-12-27 DIAGNOSIS — G4733 Obstructive sleep apnea (adult) (pediatric): Secondary | ICD-10-CM

## 2021-12-27 DIAGNOSIS — I10 Essential (primary) hypertension: Secondary | ICD-10-CM

## 2021-12-27 DIAGNOSIS — I4719 Other supraventricular tachycardia: Secondary | ICD-10-CM

## 2021-12-27 NOTE — Telephone Encounter (Signed)
The patient stated that he gives his consent for a virtural mychart visit.

## 2021-12-27 NOTE — Patient Instructions (Addendum)
Medication Instructions:  Your physician recommends that you continue on your current medications as directed. Please refer to the Current Medication list given to you today.  *If you need a refill on your cardiac medications before your next appointment, please call your pharmacy*   Lab Work: None. If you have labs (blood work) drawn today and your tests are completely normal, you will receive your results only by: [place] (if you have MyChart) OR A paper copy in the mail If you have any lab test that is abnormal or we need to change your treatment, we will call you to review the results.   Testing/Procedures: None.   Follow-Up: At [facility], you and your health needs are our priority.  As part of our continuing mission to provide you with exceptional heart care, we have created designated Provider Care Teams.  These Care Teams include your primary Cardiologist (physician) and Advanced Practice Providers (APPs -  Physician Assistants and Nurse Practitioners) who all work together to provide you with the care you need, when you need it.   Your next appointment will be as scheduled.  Provider:   Dr. Fransico Him, MD     Important Information About Sugar

## 2022-01-25 ENCOUNTER — Encounter: Payer: Self-pay | Admitting: Cardiology

## 2022-01-25 ENCOUNTER — Ambulatory Visit: Payer: Medicare HMO | Attending: Cardiology | Admitting: Cardiology

## 2022-01-25 VITALS — BP 122/56 | HR 52 | Ht 69.5 in | Wt 233.6 lb

## 2022-01-25 DIAGNOSIS — G4733 Obstructive sleep apnea (adult) (pediatric): Secondary | ICD-10-CM | POA: Diagnosis not present

## 2022-01-25 DIAGNOSIS — I10 Essential (primary) hypertension: Secondary | ICD-10-CM

## 2022-01-25 NOTE — Patient Instructions (Signed)
Medication Instructions:  Your physician recommends that you continue on your current medications as directed. Please refer to the Current Medication list given to you today.  *If you need a refill on your cardiac medications before your next appointment, please call your pharmacy*   Lab Work: None. If you have labs (blood work) drawn today and your tests are completely normal, you will receive your results only by: [place] (if you have MyChart) OR A paper copy in the mail If you have any lab test that is abnormal or we need to change your treatment, we will call you to review the results.   Testing/Procedures: You have been referred to Dr. Redmond Baseman, ENT. You need an awake endoscopy to assess your Inspire device. Please follow up with Dr. Radford Pax 6 weeks after that procedure.     Follow-Up: At [facility], you and your health needs are our priority.  As part of our continuing mission to provide you with exceptional heart care, we have created designated Provider Care Teams.  These Care Teams include your primary Cardiologist (physician) and Advanced Practice Providers (APPs -  Physician Assistants and Nurse Practitioners) who all work together to provide you with the care you need, when you need it.  We recommend signing up for the patient portal called "MyChart".  Sign up information is provided on this After Visit Summary.  MyChart is used to connect with patients for Virtual Visits (Telemedicine).  Patients are able to view lab/test results, encounter notes, upcoming appointments, etc.  Non-urgent messages can be sent to your provider as well.   To learn more about what you can do with MyChart, go to [email or web address].    Your next appointment:   6 week(s)  Provider:   Dr. Fransico Him

## 2022-01-25 NOTE — Progress Notes (Signed)
Date:  01/25/2022   ID:  [name], DOB 1950/08/19, MRN [id number] The patient was identified using 2 identifiers.  PCP:  Gaynelle Arabian, MD   [facility] Providers Cardiologist:  Larae Grooms, MD Sleep Medicine:  Fransico Him, MD     Evaluation Performed:  Follow-Up Visit  Chief Complaint:  OSA  History of Present Illness:    Wayne [name] is a 72 y.o. male with a hx of ASCAD s/p PCI in 2011, HLD, LBBB and hx of OSA but was not wearing his CPAP.  He had been followed by Dr. Radene Gunning years ago but did not tolerate the CPAP and stopped using it  He was referred for sleep study by Dr. Irish Lack.  He underwent a home sleep study in 12/2019 showing severe OSA with an AHI of 50.7/hr and nocturnal hypoxemia with O2 sats as low as 40%. He underwent unsuccessful CPAP titration due to ongoing respiratory events and was placed on BiPAP at 20/16cm H2O.    Unfortunately he was intolerant to CPAP therapy because he could not tolerate the mask.  He was referred to ENT and underwent hypoglossal nerve stimulation implant in March 2023.  At his last office visit he was doing well with his inspire device and was up to level 9 which was 1.7 V.  He underwent in lab inspire titration with a therapeutic output of 2.6 V with an AHI of 7.5 hours and lowest O2 saturation 89% at this level.  Final settings made were an output of 1.6 V to 2.6 V.  In lab inspire titration 09/05/2021 sleep lab with AHI 7.5/h at a maximum amplitude of 2.6 V and O2 saturation minimum 89%.  Therapeutic amplitude found to be 2.6 V.  His device was set at an output range of 1.6 to 2.6 V.  Office visit 09/19/2021 he was at level 6 which is 2.1 V he is having sore throat and some tongue sensitivity and in looking at his sleep study recently done his AHI was actually only 3.7/h at a voltage of 1.6 V but O2 saturations were as low as 85% so device was interrogated and changed the minimum output range to 1.4 V which is 0.2 V below his  functional threshold of 1.6 V during sleep study where his AHI was only 3.7/h and new output range was changed to 1.4 to 1.8 V with a goal of reaching level 3 which is 1.6 V  WatchPAT home sleep study 09/24/2021 with an done on output changes and showed moderate obstructive sleep apnea with an AHI of 22.9/h, mild central sleep apnea with an AHI of 9.8/h with 15.5% of time in Cheyne-Stokes respirations as well as ongoing nocturnal hypoxemia.  Office visit 10/04/2021 he was doing well and he was at level 3 which was 1.6 V.  HST done 10/07/21 showed moderate OSA at 19.2/hr on level 3 at 1.6V with moderate snoring with equal events regardless of sleeping position.    OV 11/27/2021 his HST showed moderate OSA with AHI 19.2/hr on level 3 and 1.6V with moderate snoring.  He was found to have significant backward tongue curling on exam in configuration  +/-/+ with tongue moving to the right and curling back to his throat and device reprogrammed to a D configuration of -/ -/ - and pause time increased to 30 min.  The tongue threshold is 0.2V and output changed to 0.1-0.4 with PW 210us and start delay 46mn.   He was doing well at his 4-week  check-in and was sleeping around 7-8 hours nightly and felt rested in the morning although sometimes he could still take an afternoon nap.  His snoring had improved but his wife was still spearing some mild snoring.  He was on level 4 with no tongue discomfort at that time.    Today he is back in for repeat check with inspire rep.  He continues to sleep around 7 to 8 hours a night.  He is not having any tongue discomfort or sore throat.  He is currently on level 4 which is 0.4V but still does not feel as rested as he did with CPAP.  Past Medical History:  Diagnosis Date   Arthritis    Basal cell carcinoma    MOHS   Coronary artery disease 09/2009   S/P MI and PTCA--X 4 STENTS   Depression    situational   Diverticulosis    Hip pain    CHRONIC   Hyperlipidemia     Hypertension    LBBB (left bundle branch block)    Myocardial infarction (HCC)    Neuropathy    IDIOPATHIC   Neuropathy    PAT (paroxysmal atrial tachycardia)    Sleep apnea 11/22/2016   does not wear CPAP   Past Surgical History:  Procedure Laterality Date   CARDIAC CATHETERIZATION  09/11   CHOLECYSTECTOMY     DRUG INDUCED ENDOSCOPY N/A 01/17/2021   Procedure: DRUG INDUCED SLEEP ENDOSCOPY;  Surgeon: Jerrell Belfast, MD;  Location: [place];  Service: ENT;  Laterality: N/A;   IMPLANTATION OF HYPOGLOSSAL NERVE STIMULATOR Right 04/13/2021   Procedure: IMPLANTATION OF HYPOGLOSSAL NERVE STIMULATOR;  Surgeon: Jerrell Belfast, MD;  Location: [place];  Service: ENT;  Laterality: Right;   JOINT REPLACEMENT  11/22/2016   right total knee 10/2016   LUMBAR LAMINECTOMY/DECOMPRESSION MICRODISCECTOMY Left 11/26/2016   Procedure: Laminectomy for facet/synovial cyst - left - Lumbar four-Lumbar five;  Surgeon: Earnie Larsson, MD;  Location: [place];  Service: Neurosurgery;  Laterality: Left;   RIGHT SHOULDER BONE TUMOR     SLIPPED CAPITAL FEMORAL EPIPHYSIS PINNING       Current Meds  Medication Sig   clopidogrel (PLAVIX) 75 MG tablet Take 1 tablet (75 mg total) by mouth daily.   cyclobenzaprine (FLEXERIL) 10 MG tablet Take 10 mg by mouth 3 (three) times daily as needed for muscle spasms.   diclofenac Sodium (VOLTAREN) 1 % GEL Apply 1 application. topically 2 (two) times daily as needed (pain).   docusate sodium (COLACE) 100 MG capsule Take 100 mg by mouth daily as needed for moderate constipation.   DULoxetine (CYMBALTA) 60 MG capsule Take 60 mg by mouth 2 (two) times daily.   lisinopril (ZESTRIL) 5 MG tablet Take 1 tablet (5 mg total) by mouth daily.   metoprolol succinate (TOPROL XL) 25 MG 24 hr tablet Take 1 tablet (25 mg total) every morning and 1/2 tablet (12.5 mg total) every evening.   nitroGLYCERIN (NITROSTAT) 0.4 MG SL tablet Dissolve one tablet under tongue every 5 minutes as needed for chest pain;  up to 3 doses--call 911 if pain persist   Omega-3 Fatty Acids (FISH OIL) 1000 MG CAPS Take 2 capsules (2,000 mg total) by mouth daily.   oxyCODONE-acetaminophen (PERCOCET) 10-325 MG per tablet Take 1 tablet every 6 (six) hours as needed by mouth for pain.    pregabalin (LYRICA) 150 MG capsule Take 150 mg by mouth 2 (two) times daily.   rosuvastatin (CRESTOR) 40 MG tablet TAKE ONE TABLET BY  MOUTH EVERY EVENING   zolpidem (AMBIEN) 10 MG tablet Take 10 mg by mouth at bedtime.     Allergies:   Trazodone and nefazodone   Social History   Tobacco Use   Smoking status: Never   Smokeless tobacco: Never  Vaping Use   Vaping Use: Never used  Substance Use Topics   Alcohol use: No   Drug use: No     Family Hx: The patient's family history includes Cancer in his mother; Lung cancer in his father.  ROS:   Please see the history of present illness.     All other systems reviewed and are negative.   Prior CV studies:   The following studies were reviewed today:  None  Labs/Other Tests and Data Reviewed:    EKG:  Sinus bradycardia at 55bpm with LBBB  Recent Labs: 04/13/2021: BUN 17; Creatinine, Ser 0.76; Hemoglobin 12.4; Platelets 186; Potassium 4.0; Sodium 134   Recent Lipid Panel Lab Results  Component Value Date/Time   CHOL 128 07/25/2014 08:06 AM   TRIG 94.0 07/25/2014 08:06 AM   HDL 38.70 (L) 07/25/2014 08:06 AM   CHOLHDL 3 07/25/2014 08:06 AM   LDLCALC 71 07/25/2014 08:06 AM   LDLDIRECT 76.8 02/22/2013 07:48 AM    Wt Readings from Last 3 Encounters:  01/25/22 233 lb 9.6 oz (106 kg)  11/27/21 235 lb (106.6 kg)  10/04/21 229 lb 9.6 oz (104.1 kg)    STOP-Bang Score:  5      Objective:    Vital Signs:  BP (!) 122/56   Pulse (!) 52   Ht 5' 9.5" (1.765 m)   Wt 233 lb 9.6 oz (106 kg)   SpO2 94%   BMI 34.00 kg/m   Well nourished, well developed male in no acute distress. Well appearing, alert and conversant, regular work of breathing,  good skin color  Eyes-  anicteric mouth- oral mucosa is pink  neuro- grossly intact skin- no apparent rash or lesions or cyanosis  ASSESSMENT & PLAN:    OSA  -Unfortunately he did not tolerate CPAP therapy due to inability to tolerate the mask  -He was evaluated by ENT and on 04/13/2021 underwent inspire hypoglossal nerve stimulator implantation -Office visit 05/22/2021 device was activated and interrogated at last office visit with functional threshold  0.9V. -Final settings at last office visit:  -Amplitude 0.9V  -Patient control 0.9 to 1.9V  -Pulse width 90 micro sec  -Rate '33Hz'$   -Start delay 35 min  -Pause time 15 min  -Therapy duration 8hr  -Electrodes +-+ -office visit 07/30/2021 his device was interrogated and he was on level 9 which is 1.7V -In lab inspire titration 09/05/2021 sleep lab with AHI 7.5/h at a maximum amplitude of 2.6 V and O2 saturation minimum 89%.  Therapeutic amplitude found to be 2.6 V.  His device was set at an output range of 1.6 to 2.6 V. -Office visit 09/19/2021 he was at level 6 which is 2.1 V he is having sore throat and some tongue sensitivity and in looking at his sleep study recently done his AHI was actually only 3.7/h at a voltage of 1.6 V but O2 saturations were as low as 85% so device was interrogated and ollowing changes were made:  -Changed the minimum output range to 1.4 V which is 0.2 V below his functional threshold of 1.6 V during sleep study where his AHI was only 3.7/h  -His new output range is 1.4 to 1.8 V with a goal of reaching  level 3 which is 1.6 V -WatchPAT home sleep study 09/24/2021 showed moderate obstructive sleep apnea with an AHI of 22.9/h, mild central sleep apnea with an AHI of 9.8/h with 15.5% of time in Cheyne-Stokes respirations as well as ongoing nocturnal hypoxemia  -Office visit 10/04/2021 he was at level 3 which was 1.6 V.   -HST done 10/07/21 showed moderate OSA at 19.2/hr on level 3 at 1.6V with moderate snoring with equal events regardless of sleeping  position -device interrogated 11/27/2021 and demonstrated abnormal tongue movement with an electrode configuration of +/-/+ with tongue moving to the right and curling back to his throat -device reprogrammed to a configuration of -/ -/ - and pause time increased to 30 min.   -The tongue threshold was 0.2V and output changed to 0.1-0.4 with PW 210us and start delay 66mn.  -8-week follow-up today he is currently on level 4  which is an output of 0.4volts -his tongue still deviated significantly at an output of 0.4V so he was instructed to go down to level 3 which is 0.3V. -At this point I think we need to do an awake upper airway endoscopy to assess device function and response.   -I will get him set up for this [facility]  -Followup in office 6 weeks after endoscopy to determine if he needs a repeat NPSG  Hypertension -BP is controlled -Continue prescription drug management lisinopril 5 mg daily and Toprol-XL 12.5 mg daily with as needed refills   Tests Ordered: No orders of the defined types were placed in this encounter.    Medication Changes: No orders of the defined types were placed in this encounter.    Follow Up:  Virtual visit 4 weeks  Signed, TFransico Him MD  01/25/2022 1:48 PM    [place]

## 2022-01-28 DIAGNOSIS — G894 Chronic pain syndrome: Secondary | ICD-10-CM | POA: Diagnosis not present

## 2022-01-28 DIAGNOSIS — G609 Hereditary and idiopathic neuropathy, unspecified: Secondary | ICD-10-CM | POA: Diagnosis not present

## 2022-01-28 DIAGNOSIS — G4700 Insomnia, unspecified: Secondary | ICD-10-CM | POA: Diagnosis not present

## 2022-01-28 DIAGNOSIS — F329 Major depressive disorder, single episode, unspecified: Secondary | ICD-10-CM | POA: Diagnosis not present

## 2022-01-28 DIAGNOSIS — Z79891 Long term (current) use of opiate analgesic: Secondary | ICD-10-CM | POA: Diagnosis not present

## 2022-01-28 DIAGNOSIS — R69 Illness, unspecified: Secondary | ICD-10-CM | POA: Diagnosis not present

## 2022-02-26 NOTE — Progress Notes (Unsigned)
Cardiology Office Note   Date:  02/28/2022   ID:  [name], DOB 02-25-1950, MRN [id number]  PCP:  Gaynelle Arabian, MD    No chief complaint on file.  CAD  Wt Readings from Last 3 Encounters:  02/28/22 238 lb (108 kg)  01/25/22 233 lb 9.6 oz (106 kg)  11/27/21 235 lb (106.6 kg)       History of Present Illness: Wayne [name] is a 72 y.o. male  with coronary artery disease status post multivessel PCI in 2011, hyperlipidemia, left bundle branch block, sleep apnea.   He had hip surgery and knee surgery in 2019.  Both joints replaced.    Has chronic back pain. He has had repeated cortisone injections.    He has had a few injections in the left knee.    He is planning on retiring    Had some [facility] in 2021 showing: "  with coronary artery disease status post multivessel PCI in 2011, hyperlipidemia, left bundle branch block, sleep apnea.   He had hip surgery and knee surgery in 2019.  Both joints replaced.    Still has chronic back pain. He has had repeated cortisone injections.    He is wearing a mask when he goes out.   The patient does not have symptoms concerning for COVID-19 infection (fever, chills, cough, or new shortness of breath).    Since the last visit, he has had a few injections in the left knee.    He is planning on retiring "   09/2019 stress test which showed : "No ischemia.  Normal EF.  If sx persist, would consider cath given that he had multiple stents several years ago, but no high risk features on this test. "   Had COVID in 2/23.  Mild sx.      He is considering Inspire device for OSA.  Rare SHOB.  No change.    Metoprolol was increased for tachycardia noted during sleep study.  Sx have improved.  Denies : Chest pain. Dizziness. Leg edema. Nitroglycerin use. Orthopnea. Palpitations. Paroxysmal nocturnal dyspnea. Shortness of breath. Syncope.       Past Medical History:  Diagnosis Date   Arthritis    Basal cell carcinoma    MOHS    Coronary artery disease 09/2009   S/P MI and PTCA--X 4 STENTS   Depression    situational   Diverticulosis    Hip pain    CHRONIC   Hyperlipidemia    Hypertension    LBBB (left bundle branch block)    Myocardial infarction (HCC)    Neuropathy    IDIOPATHIC   Neuropathy    PAT (paroxysmal atrial tachycardia)    Sleep apnea 11/22/2016   does not wear CPAP    Past Surgical History:  Procedure Laterality Date   CARDIAC CATHETERIZATION  09/11   CHOLECYSTECTOMY     DRUG INDUCED ENDOSCOPY N/A 01/17/2021   Procedure: DRUG INDUCED SLEEP ENDOSCOPY;  Surgeon: Jerrell Belfast, MD;  Location: [place];  Service: ENT;  Laterality: N/A;   IMPLANTATION OF HYPOGLOSSAL NERVE STIMULATOR Right 04/13/2021   Procedure: IMPLANTATION OF HYPOGLOSSAL NERVE STIMULATOR;  Surgeon: Jerrell Belfast, MD;  Location: [place];  Service: ENT;  Laterality: Right;   JOINT REPLACEMENT  11/22/2016   right total knee 10/2016   LUMBAR LAMINECTOMY/DECOMPRESSION MICRODISCECTOMY Left 11/26/2016   Procedure: Laminectomy for facet/synovial cyst - left - Lumbar four-Lumbar five;  Surgeon: Earnie Larsson, MD;  Location: [place];  Service: Neurosurgery;  Laterality: Left;   RIGHT SHOULDER BONE TUMOR     SLIPPED CAPITAL FEMORAL EPIPHYSIS PINNING       Current Outpatient Medications  Medication Sig Dispense Refill   clopidogrel (PLAVIX) 75 MG tablet Take 1 tablet (75 mg total) by mouth daily. 90 tablet 3   cyclobenzaprine (FLEXERIL) 10 MG tablet Take 10 mg by mouth 3 (three) times daily as needed for muscle spasms.     diclofenac Sodium (VOLTAREN) 1 % GEL Apply 1 application. topically 2 (two) times daily as needed (pain).     docusate sodium (COLACE) 100 MG capsule Take 100 mg by mouth daily as needed for moderate constipation.     DULoxetine (CYMBALTA) 60 MG capsule Take 60 mg by mouth 2 (two) times daily.     lisinopril (ZESTRIL) 5 MG tablet Take 1 tablet (5 mg total) by mouth daily. 90 tablet 3   metoprolol succinate (TOPROL XL)  25 MG 24 hr tablet Take 1 tablet (25 mg total) every morning and 1/2 tablet (12.5 mg total) every evening. 135 tablet 3   nitroGLYCERIN (NITROSTAT) 0.4 MG SL tablet Dissolve one tablet under tongue every 5 minutes as needed for chest pain; up to 3 doses--call 911 if pain persist 25 tablet 3   Omega-3 Fatty Acids (FISH OIL) 1000 MG CAPS Take 2 capsules (2,000 mg total) by mouth daily.  0   oxyCODONE-acetaminophen (PERCOCET) 10-325 MG per tablet Take 1 tablet every 6 (six) hours as needed by mouth for pain.      pregabalin (LYRICA) 150 MG capsule Take 150 mg by mouth 2 (two) times daily.     rosuvastatin (CRESTOR) 40 MG tablet TAKE ONE TABLET BY MOUTH EVERY EVENING 90 tablet 3   zolpidem (AMBIEN) 10 MG tablet Take 10 mg by mouth at bedtime.  3   No current facility-administered medications for this visit.    Allergies:   Trazodone and nefazodone    Social History:  The patient  reports that he has never smoked. He has never used smokeless tobacco. He reports that he does not drink alcohol and does not use drugs.   Family History:  The patient's family history includes Cancer in his mother; Lung cancer in his father.    ROS:  Please see the history of present illness.   Otherwise, review of systems are positive for working on adjusting Inspire.   All other systems are reviewed and negative.    PHYSICAL EXAM: VS:  BP 122/68   Pulse 60   Ht 5' 9.5" (1.765 m)   Wt 238 lb (108 kg)   SpO2 95%   BMI 34.64 kg/m  , BMI Body mass index is 34.64 kg/m. GEN: Well nourished, well developed, in no acute distress HEENT: normal Neck: no JVD, carotid bruits, or masses Cardiac: RRR; no murmurs, rubs, or gallops,no edema  Respiratory:  clear to auscultation bilaterally, normal work of breathing GI: soft, nontender, nondistended, + BS MS: no deformity or atrophy Skin: warm and dry, no rash Neuro:  Strength and sensation are intact Psych: euthymic mood, full affect   EKG:   The ekg ordered today  demonstrates NSR, prolonged PR, LBBB   Recent Labs: 04/13/2021: BUN 17; Creatinine, Ser 0.76; Hemoglobin 12.4; Platelets 186; Potassium 4.0; Sodium 134   Lipid Panel    Component Value Date/Time   CHOL 128 07/25/2014 0806   TRIG 94.0 07/25/2014 0806   HDL 38.70 (L) 07/25/2014 0806   CHOLHDL 3 07/25/2014 0806   VLDL 18.8 07/25/2014  0806   LDLCALC 71 07/25/2014 0806   LDLDIRECT 76.8 02/22/2013 0748     Other studies Reviewed: Additional studies/ records that were reviewed today with results demonstrating: LDL 68 in 8/23.   ASSESSMENT AND PLAN:  CAD/Old MI: S/p multivessel stenting.  Occasional chest pain that radiates to his back.  Somewhat different from his prior angina..  Plan for PET/CT.  Unable to walk on the treadmill due to chronic back pain.  With his BMI of nearly 35, I think PET/CT will be the best imaging test to evaluate for ischemia. Hyperlipidemia: The current medical regimen is effective;  continue present plan and medications.  Increase exercise as tolerated. LBBB: unchanged at last ECG. PVCs: no sx. PreDM: A1C 6.0.  Whole food, plant based diet.   OSA: Adjusting inspire sleep apnea device. HTN: The current medical regimen is effective;  continue present plan and medications. Obesity: Working on weight loss.  Back pain does limit his exercise.  He tries to be careful with his diet.    Current medicines are reviewed at length with the patient today.  The patient concerns regarding his medicines were addressed.  The following changes have been made:  No change  Labs/ tests ordered today include:  No orders of the defined types were placed in this encounter.   Recommend 150 minutes/week of aerobic exercise Low fat, low carb, high fiber diet recommended  Disposition:   FU in 1 year or sooner if stress test is abnormal     Signed, Larae Grooms, MD  02/28/2022 8:59 AM    [facility] [address], [place], [place]   [address] Phone: [phone]; Fax: [phone]

## 2022-02-28 ENCOUNTER — Ambulatory Visit: Payer: Medicare HMO | Attending: Interventional Cardiology | Admitting: Interventional Cardiology

## 2022-02-28 ENCOUNTER — Encounter: Payer: Self-pay | Admitting: Interventional Cardiology

## 2022-02-28 VITALS — BP 122/68 | HR 60 | Ht 69.5 in | Wt 238.0 lb

## 2022-02-28 DIAGNOSIS — I10 Essential (primary) hypertension: Secondary | ICD-10-CM | POA: Diagnosis not present

## 2022-02-28 DIAGNOSIS — E669 Obesity, unspecified: Secondary | ICD-10-CM | POA: Diagnosis not present

## 2022-02-28 DIAGNOSIS — I447 Left bundle-branch block, unspecified: Secondary | ICD-10-CM | POA: Diagnosis not present

## 2022-02-28 DIAGNOSIS — I25118 Atherosclerotic heart disease of native coronary artery with other forms of angina pectoris: Secondary | ICD-10-CM | POA: Diagnosis not present

## 2022-02-28 DIAGNOSIS — I252 Old myocardial infarction: Secondary | ICD-10-CM | POA: Diagnosis not present

## 2022-02-28 DIAGNOSIS — R7303 Prediabetes: Secondary | ICD-10-CM

## 2022-02-28 DIAGNOSIS — E66811 Obesity, class 1: Secondary | ICD-10-CM

## 2022-02-28 DIAGNOSIS — E782 Mixed hyperlipidemia: Secondary | ICD-10-CM | POA: Diagnosis not present

## 2022-02-28 DIAGNOSIS — G4733 Obstructive sleep apnea (adult) (pediatric): Secondary | ICD-10-CM

## 2022-02-28 NOTE — Patient Instructions (Addendum)
Medication Instructions:  Your physician recommends that you continue on your current medications as directed. Please refer to the Current Medication list given to you today.  *If you need a refill on your cardiac medications before your next appointment, please call your pharmacy*   Lab Work: none If you have labs (blood work) drawn today and your tests are completely normal, you will receive your results only by: [place] (if you have MyChart) OR A paper copy in the mail If you have any lab test that is abnormal or we need to change your treatment, we will call you to review the results.   Testing/Procedures: Dr Irish Lack recommends you have a cardiac PET Scan   Follow-Up: At [facility] you and your health needs are our priority.  As part of our continuing mission to provide you with exceptional heart care, we have created designated Provider Care Teams.  These Care Teams include your primary Cardiologist (physician) and Advanced Practice Providers (APPs -  Physician Assistants and Nurse Practitioners) who all work together to provide you with the care you need, when you need it.  We recommend signing up for the patient portal called "MyChart".  Sign up information is provided on this After Visit Summary.  MyChart is used to connect with patients for Virtual Visits (Telemedicine).  Patients are able to view lab/test results, encounter notes, upcoming appointments, etc.  Non-urgent messages can be sent to your provider as well.   To learn more about what you can do with MyChart, go to [email or web address].    Your next appointment:   12 month(s)  Provider:   Larae Grooms, MD     Other Instructions     How to Prepare for Your Cardiac PET/CT Stress Test:  1. Please do not take these medications before your test:   Medications that may interfere with the cardiac pharmacological stress agent (ex. nitrates - including erectile dysfunction medications,  isosorbide mononitrate, tamulosin or beta-blockers) the day of the exam. (Erectile dysfunction medication should be held for at least 72 hrs prior to test)--do not take Toprol the morning of the exam Theophylline containing medications for 12 hours. Dipyridamole 48 hours prior to the test. Your remaining medications may be taken with water.  2. Nothing to eat or drink, except water, 3 hours prior to arrival time.   NO caffeine/decaffeinated products, or chocolate 12 hours prior to arrival.  3. NO perfume, cologne or lotion  4. Total time is 1 to 2 hours; you may want to bring reading material for the waiting time.  5. Please report to Radiology at the [facility] Main Entrance 30 minutes early for your test.  [place], [place] [address]   In preparation for your appointment, medication and supplies will be purchased.  Appointment availability is limited, so if you need to cancel or reschedule, please call the Radiology Department at [phone]  24 hours in advance to avoid a cancellation fee of $100.00  What to Expect After you Arrive:  Once you arrive and check in for your appointment, you will be taken to a preparation room within the Radiology Department.  A technologist or Nurse will obtain your medical history, verify that you are correctly prepped for the exam, and explain the procedure.  Afterwards,  an IV will be started in your arm and electrodes will be placed on your skin for EKG monitoring during the stress portion of the exam. Then you will be escorted to the  PET/CT scanner.  There, staff will get you positioned on the scanner and obtain a blood pressure and EKG.  During the exam, you will continue to be connected to the EKG and blood pressure machines.  A small, safe amount of a radioactive tracer will be injected in your IV to obtain a series of pictures of your heart along with an injection of a stress agent.    After your Exam:  It is recommended  that you eat a meal and drink a caffeinated beverage to counter act any effects of the stress agent.  Drink plenty of fluids for the remainder of the day and urinate frequently for the first couple of hours after the exam.  Your doctor will inform you of your test results within 7-10 business days.  For questions about your test or how to prepare for your test, please call: Marchia Bond, Cardiac Imaging Nurse Navigator  Gordy Clement, Cardiac Imaging Nurse Navigator Office: [phone]

## 2022-03-04 DIAGNOSIS — R69 Illness, unspecified: Secondary | ICD-10-CM | POA: Diagnosis not present

## 2022-03-04 DIAGNOSIS — G894 Chronic pain syndrome: Secondary | ICD-10-CM | POA: Diagnosis not present

## 2022-03-04 DIAGNOSIS — G4700 Insomnia, unspecified: Secondary | ICD-10-CM | POA: Diagnosis not present

## 2022-03-04 DIAGNOSIS — G609 Hereditary and idiopathic neuropathy, unspecified: Secondary | ICD-10-CM | POA: Diagnosis not present

## 2022-03-05 ENCOUNTER — Telehealth: Payer: Self-pay | Admitting: Cardiology

## 2022-03-05 NOTE — Telephone Encounter (Signed)
Called patient to r/s his appointment with Dr. Radford Pax for 03/20/22. Patient states that he is not sure if he needs the appointment or not. He is having a hard time getting in to see Dr. Redmond Baseman. I have placed holds on 03/11/22 to move the patient to so he is seen before Dr. Radford Pax is out but he is not sure if the appointment will be needed, he is an INSPIRE pt. Please advise.

## 2022-03-05 NOTE — Telephone Encounter (Signed)
Called and left message with Dr. Iona Beard office regarding getting patient scheduled.

## 2022-03-06 ENCOUNTER — Other Ambulatory Visit: Payer: Self-pay

## 2022-03-06 DIAGNOSIS — G4733 Obstructive sleep apnea (adult) (pediatric): Secondary | ICD-10-CM

## 2022-03-06 NOTE — Progress Notes (Signed)
Re-referred to Dr. Redmond Baseman

## 2022-03-08 NOTE — Telephone Encounter (Signed)
Call to Dr. Redmond Baseman office, on hold for 15 mins, unable to get through.

## 2022-03-08 NOTE — Progress Notes (Signed)
Patient with appointment to see Dr. Redmond Baseman 03/14/22 at 11:10 AM.

## 2022-03-14 DIAGNOSIS — G4733 Obstructive sleep apnea (adult) (pediatric): Secondary | ICD-10-CM | POA: Diagnosis not present

## 2022-03-20 ENCOUNTER — Other Ambulatory Visit: Payer: Self-pay | Admitting: Interventional Cardiology

## 2022-03-20 ENCOUNTER — Ambulatory Visit: Payer: Medicare HMO | Admitting: Cardiology

## 2022-04-01 ENCOUNTER — Telehealth (HOSPITAL_COMMUNITY): Payer: Self-pay | Admitting: *Deleted

## 2022-04-01 NOTE — Telephone Encounter (Signed)
Reaching out to patient to offer assistance regarding upcoming cardiac imaging study; pt verbalizes understanding of appt date/time, parking situation and where to check in, pre-test NPO status and verified current allergies; name and call back number provided for further questions should they arise  Ariadna Setter RN Navigator Cardiac Imaging [facility] [phone] office [phone] cell  Patient aware to avoid caffeine 12 hours prior to his cardiac PET scan. 

## 2022-04-03 ENCOUNTER — Encounter (HOSPITAL_COMMUNITY)
Admission: RE | Admit: 2022-04-03 | Discharge: 2022-04-03 | Disposition: A | Discharge status: Home / Self Care | Payer: Medicare HMO | Source: Ambulatory Visit | Attending: Interventional Cardiology | Admitting: Interventional Cardiology

## 2022-04-03 DIAGNOSIS — I25118 Atherosclerotic heart disease of native coronary artery with other forms of angina pectoris: Secondary | ICD-10-CM | POA: Diagnosis not present

## 2022-04-03 DIAGNOSIS — I252 Old myocardial infarction: Secondary | ICD-10-CM | POA: Diagnosis not present

## 2022-04-03 LAB — NM PET CT CARDIAC PERFUSION MULTI W/ABSOLUTE BLOODFLOW
LV dias vol: 164 mL (ref 62–150)
LV sys vol: 80 mL
MBFR: 2
Nuc Rest EF: 45 %
Nuc Stress EF: 51 %
Peak HR: 55 {beats}/min
Rest HR: 49 {beats}/min
Rest MBF: 0.68 ml/g/min
Rest Nuclear Isotope Dose: 28.1 mCi
ST Depression (mm): 0 mm
Stress MBF: 1.36 ml/g/min
Stress Nuclear Isotope Dose: 28.1 mCi
TID: 1.06

## 2022-04-03 MED ORDER — REGADENOSON 0.4 MG/5ML IV SOLN
INTRAVENOUS | Status: AC
  Administered 2022-04-03: 0.4 mg via INTRAVENOUS
  Filled 2022-04-03: qty 5

## 2022-04-03 MED ORDER — RUBIDIUM RB82 GENERATOR (RUBYFILL)
28.1000 | PACK | Freq: Once | INTRAVENOUS | Status: AC
  Administered 2022-04-03: 28.1 via INTRAVENOUS

## 2022-04-04 ENCOUNTER — Other Ambulatory Visit: Payer: Self-pay | Admitting: *Deleted

## 2022-04-04 DIAGNOSIS — I10 Essential (primary) hypertension: Secondary | ICD-10-CM

## 2022-04-04 DIAGNOSIS — I252 Old myocardial infarction: Secondary | ICD-10-CM

## 2022-04-04 DIAGNOSIS — I447 Left bundle-branch block, unspecified: Secondary | ICD-10-CM

## 2022-04-04 DIAGNOSIS — I25118 Atherosclerotic heart disease of native coronary artery with other forms of angina pectoris: Secondary | ICD-10-CM

## 2022-04-05 ENCOUNTER — Other Ambulatory Visit: Payer: Self-pay | Admitting: Interventional Cardiology

## 2022-04-08 DIAGNOSIS — Z79891 Long term (current) use of opiate analgesic: Secondary | ICD-10-CM | POA: Diagnosis not present

## 2022-04-08 DIAGNOSIS — R69 Illness, unspecified: Secondary | ICD-10-CM | POA: Diagnosis not present

## 2022-04-08 DIAGNOSIS — G4700 Insomnia, unspecified: Secondary | ICD-10-CM | POA: Diagnosis not present

## 2022-04-08 DIAGNOSIS — G894 Chronic pain syndrome: Secondary | ICD-10-CM | POA: Diagnosis not present

## 2022-04-08 DIAGNOSIS — G609 Hereditary and idiopathic neuropathy, unspecified: Secondary | ICD-10-CM | POA: Diagnosis not present

## 2022-04-09 ENCOUNTER — Ambulatory Visit: Payer: Medicare HMO | Admitting: Interventional Cardiology

## 2022-04-12 ENCOUNTER — Ambulatory Visit: Payer: Medicare HMO | Admitting: Cardiology

## 2022-04-24 ENCOUNTER — Telehealth: Payer: Self-pay

## 2022-04-24 DIAGNOSIS — G4733 Obstructive sleep apnea (adult) (pediatric): Secondary | ICD-10-CM

## 2022-04-24 NOTE — Telephone Encounter (Signed)
Called patient to update him on plan for repeat Itamar on current Inspire settings. No answer, left detailed message per DPR asking patient to call office to discuss.

## 2022-04-24 NOTE — Telephone Encounter (Signed)
Called patient to set up Itamar sleep study. Advised patient new sleep study needed, patient to do at home with new Inspire settings. Patient states he is most comfortable on level 3 of the 4 settings Inspire rep gave him to work on. Advised patient to do the home sleep test on the level he tolerates best.

## 2022-04-24 NOTE — Telephone Encounter (Signed)
-----   Message from Quintella Reichert, MD sent at 04/24/2022 11:54 AM EDT ----- Regarding: RE: Office visit w/ Inspire rep? Recommend repeating Itamar HST on current Itamar settings  ----- Message ----- From: Luellen Pucker, RN Sent: 04/24/2022  10:21 AM EDT To: Quintella Reichert, MD Subject: Office visit w/ Inspire rep?                   Just checking on this guy, I have a ton of notes on him but I'm not sure I remember why I was following him in the first place.  He had an office visit with you 01/25/22 where you re-referred to Dr. Jenne Pane for endoscopy to see if he needed f/u NPSG.  He saw Dr. Jenne Pane on 03/14/22. Dr. Jenne Pane did a Laryngoscopy and said "In discussing results with the Inspire rep and his recent history, I recommend an updated sleep study at the current settings. We discussed potential dual therapy with an oral appliance if Inspire therapy alone cannot fully treat his problem."  Patient doesn't have any future appointments with you. I have other patients to schedule with Inspire rep, does he need to be seen with Earnest Bailey Rep also? Or just order sleep study?  Alcario Drought

## 2022-05-01 ENCOUNTER — Telehealth: Payer: Self-pay | Admitting: *Deleted

## 2022-05-01 NOTE — Telephone Encounter (Signed)
Prior Authorization for ITAMAR sent to MEDICARE via web portal. Tracking Number .  READY-NO PA REQ 

## 2022-05-02 ENCOUNTER — Ambulatory Visit (HOSPITAL_COMMUNITY): Payer: Medicare HMO | Attending: Cardiology

## 2022-05-02 DIAGNOSIS — I25118 Atherosclerotic heart disease of native coronary artery with other forms of angina pectoris: Secondary | ICD-10-CM

## 2022-05-02 DIAGNOSIS — I10 Essential (primary) hypertension: Secondary | ICD-10-CM | POA: Diagnosis not present

## 2022-05-02 DIAGNOSIS — I447 Left bundle-branch block, unspecified: Secondary | ICD-10-CM

## 2022-05-02 DIAGNOSIS — I252 Old myocardial infarction: Secondary | ICD-10-CM | POA: Diagnosis not present

## 2022-05-02 LAB — ECHOCARDIOGRAM COMPLETE
Area-P 1/2: 2.41 cm2
P 1/2 time: 892 msec
S' Lateral: 3.3 cm

## 2022-05-02 NOTE — Progress Notes (Signed)
Normal LV/RV function.  Normal valvular function.  Dilated atria

## 2022-05-02 NOTE — Telephone Encounter (Signed)
Front desk staff called me this morning and said pt said he was her about his Earnest Bailey and that someone called him. I stated I did call him, but I was sent a message late yesterday after 5 pm. I reviewed the chart and pt is needing to be set up with an Itamar study. I went out to the lobby and s/w the pt. I informed him that I only got the message last night after 5 pm. He says yes but he s/w the nurse 04/24/22. I stated that I saw that and I could not answer to why I was only getting the message as of last night after 5 pm that he needs a sleep study. I asked pt if he could give me just 5 minutes and I will get this taken care of for him. Pt agreeable to plan of care.   I have set the pt up today with a repeat Itamar sleep study and he has been registered and ok to proceed. Pt thanked me for the time and my help. Pt has been given the PIN# [id number].   Called and made the patient aware that he may proceed with the [facility] Sleep Study. PIN # provided to the patient. Patient made aware that he will be contacted after the test has been read with the results and any recommendations. Patient verbalized understanding and thanked me for the call.

## 2022-05-04 ENCOUNTER — Encounter (INDEPENDENT_AMBULATORY_CARE_PROVIDER_SITE_OTHER): Payer: Medicare HMO | Admitting: Cardiology

## 2022-05-04 DIAGNOSIS — G4733 Obstructive sleep apnea (adult) (pediatric): Secondary | ICD-10-CM | POA: Diagnosis not present

## 2022-05-06 ENCOUNTER — Ambulatory Visit: Payer: Medicare HMO | Attending: Cardiology

## 2022-05-06 DIAGNOSIS — G4733 Obstructive sleep apnea (adult) (pediatric): Secondary | ICD-10-CM

## 2022-05-06 NOTE — Procedures (Signed)
SLEEP STUDY REPORT Patient Information Study Date: 05/04/2022 Patient Name: Wayne [name] Patient ID: [id number] Birth Date: 06-07-1950 Age: 72 Gender:  BMI: 32.8 (W=229 lb, H=5' 10'') Referring Physician: Armanda Magic, MD  TEST DESCRIPTION:  Home sleep apnea testing was completed using the WatchPat, a Type 1 device, utilizing peripheral arterial tonometry (PAT), chest movement, actigraphy, pulse oximetry, pulse rate, body position and snore.  AHI was calculated with apnea and hypopnea using valid sleep time as the denominator. RDI includes apneas, hypopneas, and RERAs.  The data acquired and the scoring of sleep and all associated events were performed in accordance with the recommended standards and specifications as outlined in the AASM Manual for the Scoring of Sleep and Associated Events 2.2.0 (2015).  FINDINGS:  1.  Moderate Obstructive Sleep Apnea with AHI 24.9/hr.   2.  Mild Central Sleep Apnea with pAHIc 10.7/hr.  3.  Oxygen desaturations as low as 60%.  4.  Moderate snoring was present. O2 sats were < 88% for 18.4 min.  5.  Total sleep time was 6 hrs and 8 min.  6.  25.1% of total sleep time was spent in REM sleep.   7.  Shortened sleep onset latency at 6 min  8.  Shortened REM sleep onset latency at 60 min.   9.  Total awakenings were 4.  10. Arrhythmia detection:  None.  DIAGNOSIS:   Moderate Obstructive Sleep Apnea (G47.33) Nocturnal Hypoxemia  RECOMMENDATIONS:   1.  Clinical correlation of these findings is necessary.  The decision to treat obstructive sleep apnea (OSA) is usually based on the presence of apnea symptoms or the presence of associated medical conditions such as Hypertension, Congestive Heart Failure, Atrial Fibrillation or Obesity.  The most common symptoms of OSA are snoring, gasping for breath while sleeping, daytime sleepiness and fatigue.   2.  Initiating apnea therapy is recommended given the presence of symptoms and/or associated  conditions. Recommend proceeding with one of the following:     a.  Auto-CPAP therapy with a pressure range of 5-20cm H2O.     b.  An oral appliance (OA) that can be obtained from certain dentists with expertise in sleep medicine.  These are primarily of use in non-obese patients with mild and moderate disease.     c.  An ENT consultation which may be useful to look for specific causes of obstruction and possible treatment options.     d.  If patient is intolerant to PAP therapy, consider referral to ENT for evaluation for hypoglossal nerve stimulator.   3.  Close follow-up is necessary to ensure success with CPAP or oral appliance therapy for maximum benefit.  4.  A follow-up oximetry study on CPAP is recommended to assess the adequacy of therapy and determine the need for supplemental oxygen or the potential need for Bi-level therapy.  An arterial blood gas to determine the adequacy of baseline ventilation and oxygenation should also be considered.  5.  Healthy sleep recommendations include:  adequate nightly sleep (normal 7-9 hrs/night), avoidance of caffeine after noon and alcohol near bedtime, and maintaining a sleep environment that is cool, dark and quiet.  6.  Weight loss for overweight patients is recommended.  Even modest amounts of weight loss can significantly improve the severity of sleep apnea.  7.  Snoring recommendations include:  weight loss where appropriate, side sleeping, and avoidance of alcohol before bed.  8.  Operation of motor vehicle should not be performed when sleepy.  Signature:   Armanda Magic, MD; [facility]; Diplomat, American Board of Sleep Medicine Electronically Signed: 05/06/2022 4:13:28 PM

## 2022-05-08 DIAGNOSIS — M1712 Unilateral primary osteoarthritis, left knee: Secondary | ICD-10-CM | POA: Diagnosis not present

## 2022-05-13 DIAGNOSIS — G894 Chronic pain syndrome: Secondary | ICD-10-CM | POA: Diagnosis not present

## 2022-05-13 DIAGNOSIS — G609 Hereditary and idiopathic neuropathy, unspecified: Secondary | ICD-10-CM | POA: Diagnosis not present

## 2022-05-13 DIAGNOSIS — G4700 Insomnia, unspecified: Secondary | ICD-10-CM | POA: Diagnosis not present

## 2022-05-13 DIAGNOSIS — F329 Major depressive disorder, single episode, unspecified: Secondary | ICD-10-CM | POA: Diagnosis not present

## 2022-05-14 ENCOUNTER — Telehealth: Payer: Self-pay | Admitting: *Deleted

## 2022-05-14 DIAGNOSIS — M1712 Unilateral primary osteoarthritis, left knee: Secondary | ICD-10-CM | POA: Diagnosis not present

## 2022-05-14 NOTE — Telephone Encounter (Signed)
-----   Message from Gaynelle Cage, [place] sent at 05/07/2022 12:13 PM EDT -----  ----- Message ----- From: Quintella Reichert, MD Sent: 05/06/2022   4:16 PM EDT To: [facility]  Please find out if patient did his Wayne [name] using his Inspire device

## 2022-05-14 NOTE — Telephone Encounter (Signed)
Reached out to patient to ask if he took his HST while using his inspire device. [email or web address]

## 2022-05-15 NOTE — Telephone Encounter (Signed)
Pt returned call

## 2022-05-15 NOTE — Telephone Encounter (Signed)
The patient has been notified of the result and verbalized understanding.  All questions (if any) were answered. Wayne [name], CMA 05/15/2022 11:20 AM    Patient states Yes, he took his Home Test using his inspire device. He took the test on the 5th level.  He sleeps better with his device turned down on the 4th level.

## 2022-05-17 ENCOUNTER — Other Ambulatory Visit: Payer: Self-pay | Admitting: Interventional Cardiology

## 2022-05-22 ENCOUNTER — Encounter: Payer: Self-pay | Admitting: Cardiology

## 2022-05-22 DIAGNOSIS — M1712 Unilateral primary osteoarthritis, left knee: Secondary | ICD-10-CM | POA: Diagnosis not present

## 2022-05-24 ENCOUNTER — Telehealth: Payer: Self-pay

## 2022-05-24 DIAGNOSIS — G4733 Obstructive sleep apnea (adult) (pediatric): Secondary | ICD-10-CM

## 2022-05-24 NOTE — Telephone Encounter (Signed)
Called patient to discuss Dr. Norris Cross recommendation that he have another awake endoscopy. Patient verbalizes understanding and agrees to plan. Order placed.

## 2022-06-17 DIAGNOSIS — F329 Major depressive disorder, single episode, unspecified: Secondary | ICD-10-CM | POA: Diagnosis not present

## 2022-06-17 DIAGNOSIS — G4700 Insomnia, unspecified: Secondary | ICD-10-CM | POA: Diagnosis not present

## 2022-06-17 DIAGNOSIS — G609 Hereditary and idiopathic neuropathy, unspecified: Secondary | ICD-10-CM | POA: Diagnosis not present

## 2022-06-17 DIAGNOSIS — G894 Chronic pain syndrome: Secondary | ICD-10-CM | POA: Diagnosis not present

## 2022-07-10 ENCOUNTER — Encounter: Payer: Self-pay | Admitting: Cardiology

## 2022-07-11 ENCOUNTER — Telehealth: Payer: Self-pay

## 2022-07-11 DIAGNOSIS — G4733 Obstructive sleep apnea (adult) (pediatric): Secondary | ICD-10-CM

## 2022-07-11 NOTE — Addendum Note (Signed)
Addended by: Luellen Pucker on: 07/11/2022 05:05 PM   Modules accepted: Orders

## 2022-07-11 NOTE — Telephone Encounter (Signed)
Spoke to patient at length and reviewed that awake endoscopy showed that [facility] device was working well per Dr. Mayford Knife. Dr. Mayford Knife advises referral to sleep dentistry for oral device as this may work with Inspire device to keep airway open at night. The next step would be to have an in lab PSG sleep study with oral device and Inspire device working to determine if breathing is improved. Patient agrees to plan, referral to Dr. Myrtis Ser placed.

## 2022-07-17 ENCOUNTER — Telehealth: Payer: Self-pay | Admitting: *Deleted

## 2022-07-17 NOTE — Telephone Encounter (Signed)
-----   Message from Quintella Reichert, MD sent at 07/11/2022 11:01 AM EDT ----- Inspire device on 05/04/2022 showed AHI of 24.9/hr and central AHI of 10.7/hr.  Please refer for oral device with Dr. Myrtis Ser and then get in lab sleep study  (PSG) using his oral device and the Inspire device with the Inspire rep present

## 2022-07-17 NOTE — Telephone Encounter (Signed)
Referral for oral device has been made awaiting the PSG.

## 2022-07-22 DIAGNOSIS — F329 Major depressive disorder, single episode, unspecified: Secondary | ICD-10-CM | POA: Diagnosis not present

## 2022-07-22 DIAGNOSIS — G609 Hereditary and idiopathic neuropathy, unspecified: Secondary | ICD-10-CM | POA: Diagnosis not present

## 2022-07-22 DIAGNOSIS — G894 Chronic pain syndrome: Secondary | ICD-10-CM | POA: Diagnosis not present

## 2022-07-22 DIAGNOSIS — G4700 Insomnia, unspecified: Secondary | ICD-10-CM | POA: Diagnosis not present

## 2022-08-26 DIAGNOSIS — G894 Chronic pain syndrome: Secondary | ICD-10-CM | POA: Diagnosis not present

## 2022-08-26 DIAGNOSIS — G4700 Insomnia, unspecified: Secondary | ICD-10-CM | POA: Diagnosis not present

## 2022-08-26 DIAGNOSIS — F329 Major depressive disorder, single episode, unspecified: Secondary | ICD-10-CM | POA: Diagnosis not present

## 2022-08-26 DIAGNOSIS — G609 Hereditary and idiopathic neuropathy, unspecified: Secondary | ICD-10-CM | POA: Diagnosis not present

## 2022-09-06 DIAGNOSIS — G4733 Obstructive sleep apnea (adult) (pediatric): Secondary | ICD-10-CM | POA: Diagnosis not present

## 2022-09-06 DIAGNOSIS — G629 Polyneuropathy, unspecified: Secondary | ICD-10-CM | POA: Diagnosis not present

## 2022-09-06 DIAGNOSIS — E782 Mixed hyperlipidemia: Secondary | ICD-10-CM | POA: Diagnosis not present

## 2022-09-06 DIAGNOSIS — Z1331 Encounter for screening for depression: Secondary | ICD-10-CM | POA: Diagnosis not present

## 2022-09-06 DIAGNOSIS — M25559 Pain in unspecified hip: Secondary | ICD-10-CM | POA: Diagnosis not present

## 2022-09-06 DIAGNOSIS — Z79899 Other long term (current) drug therapy: Secondary | ICD-10-CM | POA: Diagnosis not present

## 2022-09-06 DIAGNOSIS — Z0000 Encounter for general adult medical examination without abnormal findings: Secondary | ICD-10-CM | POA: Diagnosis not present

## 2022-09-06 DIAGNOSIS — R7303 Prediabetes: Secondary | ICD-10-CM | POA: Diagnosis not present

## 2022-09-06 DIAGNOSIS — I25118 Atherosclerotic heart disease of native coronary artery with other forms of angina pectoris: Secondary | ICD-10-CM | POA: Diagnosis not present

## 2022-09-06 LAB — LAB REPORT - SCANNED
A1c: 6
EGFR: 96

## 2022-09-09 ENCOUNTER — Encounter: Payer: Self-pay | Admitting: Interventional Cardiology

## 2022-09-10 NOTE — Telephone Encounter (Signed)
I spoke with patient. He would like to be seen at [facility] and would like to follow up with Dr Lynnette Caffey or Dr Clifton James

## 2022-09-11 DIAGNOSIS — M1712 Unilateral primary osteoarthritis, left knee: Secondary | ICD-10-CM | POA: Diagnosis not present

## 2022-09-19 DIAGNOSIS — I10 Essential (primary) hypertension: Secondary | ICD-10-CM | POA: Diagnosis not present

## 2022-09-19 DIAGNOSIS — G4733 Obstructive sleep apnea (adult) (pediatric): Secondary | ICD-10-CM | POA: Diagnosis not present

## 2022-09-19 DIAGNOSIS — M62838 Other muscle spasm: Secondary | ICD-10-CM | POA: Diagnosis not present

## 2022-09-19 DIAGNOSIS — Z85828 Personal history of other malignant neoplasm of skin: Secondary | ICD-10-CM | POA: Diagnosis not present

## 2022-09-19 DIAGNOSIS — G629 Polyneuropathy, unspecified: Secondary | ICD-10-CM | POA: Diagnosis not present

## 2022-09-19 DIAGNOSIS — E785 Hyperlipidemia, unspecified: Secondary | ICD-10-CM | POA: Diagnosis not present

## 2022-09-19 DIAGNOSIS — F325 Major depressive disorder, single episode, in full remission: Secondary | ICD-10-CM | POA: Diagnosis not present

## 2022-09-19 DIAGNOSIS — I252 Old myocardial infarction: Secondary | ICD-10-CM | POA: Diagnosis not present

## 2022-09-19 DIAGNOSIS — I2510 Atherosclerotic heart disease of native coronary artery without angina pectoris: Secondary | ICD-10-CM | POA: Diagnosis not present

## 2022-09-19 DIAGNOSIS — E669 Obesity, unspecified: Secondary | ICD-10-CM | POA: Diagnosis not present

## 2022-09-19 DIAGNOSIS — M1990 Unspecified osteoarthritis, unspecified site: Secondary | ICD-10-CM | POA: Diagnosis not present

## 2022-09-19 DIAGNOSIS — Z008 Encounter for other general examination: Secondary | ICD-10-CM | POA: Diagnosis not present

## 2022-09-19 DIAGNOSIS — Z809 Family history of malignant neoplasm, unspecified: Secondary | ICD-10-CM | POA: Diagnosis not present

## 2022-09-23 DIAGNOSIS — G4700 Insomnia, unspecified: Secondary | ICD-10-CM | POA: Diagnosis not present

## 2022-09-23 DIAGNOSIS — G609 Hereditary and idiopathic neuropathy, unspecified: Secondary | ICD-10-CM | POA: Diagnosis not present

## 2022-09-23 DIAGNOSIS — G894 Chronic pain syndrome: Secondary | ICD-10-CM | POA: Diagnosis not present

## 2022-09-23 DIAGNOSIS — F329 Major depressive disorder, single episode, unspecified: Secondary | ICD-10-CM | POA: Diagnosis not present

## 2022-09-23 DIAGNOSIS — Z79891 Long term (current) use of opiate analgesic: Secondary | ICD-10-CM | POA: Diagnosis not present

## 2022-10-02 DIAGNOSIS — M25562 Pain in left knee: Secondary | ICD-10-CM | POA: Diagnosis not present

## 2022-10-10 DIAGNOSIS — Z6832 Body mass index (BMI) 32.0-32.9, adult: Secondary | ICD-10-CM | POA: Diagnosis not present

## 2022-10-10 DIAGNOSIS — M5416 Radiculopathy, lumbar region: Secondary | ICD-10-CM | POA: Diagnosis not present

## 2022-10-14 ENCOUNTER — Other Ambulatory Visit: Payer: Self-pay | Admitting: Neurosurgery

## 2022-10-14 DIAGNOSIS — M5416 Radiculopathy, lumbar region: Secondary | ICD-10-CM

## 2022-10-17 ENCOUNTER — Other Ambulatory Visit (HOSPITAL_COMMUNITY): Payer: Self-pay | Admitting: Neurosurgery

## 2022-10-17 DIAGNOSIS — M5416 Radiculopathy, lumbar region: Secondary | ICD-10-CM

## 2022-10-18 ENCOUNTER — Encounter (HOSPITAL_COMMUNITY): Payer: Self-pay | Admitting: Neurosurgery

## 2022-10-25 ENCOUNTER — Encounter: Payer: Self-pay | Admitting: Podiatry

## 2022-10-25 ENCOUNTER — Ambulatory Visit: Payer: Medicare HMO | Admitting: Podiatry

## 2022-10-25 ENCOUNTER — Ambulatory Visit (HOSPITAL_COMMUNITY)
Admission: RE | Admit: 2022-10-25 | Discharge: 2022-10-25 | Disposition: A | Discharge status: Home / Self Care | Payer: Medicare HMO | Source: Ambulatory Visit | Attending: Neurosurgery | Admitting: Neurosurgery

## 2022-10-25 DIAGNOSIS — S90211A Contusion of right great toe with damage to nail, initial encounter: Secondary | ICD-10-CM | POA: Diagnosis not present

## 2022-10-25 DIAGNOSIS — M48061 Spinal stenosis, lumbar region without neurogenic claudication: Secondary | ICD-10-CM | POA: Diagnosis not present

## 2022-10-25 DIAGNOSIS — M5416 Radiculopathy, lumbar region: Secondary | ICD-10-CM | POA: Insufficient documentation

## 2022-10-25 DIAGNOSIS — M5117 Intervertebral disc disorders with radiculopathy, lumbosacral region: Secondary | ICD-10-CM | POA: Diagnosis not present

## 2022-10-25 DIAGNOSIS — M5116 Intervertebral disc disorders with radiculopathy, lumbar region: Secondary | ICD-10-CM | POA: Diagnosis not present

## 2022-10-25 DIAGNOSIS — M4726 Other spondylosis with radiculopathy, lumbar region: Secondary | ICD-10-CM | POA: Diagnosis not present

## 2022-10-25 NOTE — Patient Instructions (Signed)

## 2022-10-27 NOTE — Progress Notes (Signed)
Subjective:   Patient ID: Wayne [name], male   DOB: 72 y.o.   MRN: [id number]   HPI Patient presents with a lot of redness and inflammation in the right big toenail and wants to make sure it is okay stating that he did bump it.  Patient does not smoke likes to be active   Review of Systems  All other systems reviewed and are negative.       Objective:  Physical Exam Vitals and nursing note reviewed.  Constitutional:      Appearance: He is well-developed.  Pulmonary:     Effort: Pulmonary effort is normal.  Musculoskeletal:        General: Normal range of motion.  Skin:    General: Skin is warm.  Neurological:     Mental Status: He is alert.     Neurovascular status intact muscle strength adequate range of motion within normal limits with patient found to have a discolored thickened right hallux nail that is not loose with no active drainage noted     Assessment:  Traumatized right hallux nail localized no indications of infection currently     Plan:  H&P reviewed and at this point I have recommended soaking this and that if it were to get loose start draining we will have to remove it organ to try to hold off at this point and it should be okay

## 2022-10-28 DIAGNOSIS — G4700 Insomnia, unspecified: Secondary | ICD-10-CM | POA: Diagnosis not present

## 2022-10-28 DIAGNOSIS — G609 Hereditary and idiopathic neuropathy, unspecified: Secondary | ICD-10-CM | POA: Diagnosis not present

## 2022-10-28 DIAGNOSIS — G894 Chronic pain syndrome: Secondary | ICD-10-CM | POA: Diagnosis not present

## 2022-10-28 DIAGNOSIS — F329 Major depressive disorder, single episode, unspecified: Secondary | ICD-10-CM | POA: Diagnosis not present

## 2022-11-01 ENCOUNTER — Telehealth: Payer: Self-pay

## 2022-11-01 NOTE — Telephone Encounter (Signed)
  Patient Consent for Virtual Visit        Wayne [name] has provided verbal consent on 11/01/2022 for a virtual visit (video or telephone).   CONSENT FOR VIRTUAL VISIT FOR:  Wayne [name]  By participating in this virtual visit I agree to the following:  I hereby voluntarily request, consent and authorize [facility] and its employed or contracted physicians, physician assistants, nurse practitioners or other licensed health care professionals (the Practitioner), to provide me with telemedicine health care services (the "Services") as deemed necessary by the treating Practitioner. I acknowledge and consent to receive the Services by the Practitioner via telemedicine. I understand that the telemedicine visit will involve communicating with the Practitioner through live audiovisual communication technology and the disclosure of certain medical information by electronic transmission. I acknowledge that I have been given the opportunity to request an in-person assessment or other available alternative prior to the telemedicine visit and am voluntarily participating in the telemedicine visit.  I understand that I have the right to withhold or withdraw my consent to the use of telemedicine in the course of my care at any time, without affecting my right to future care or treatment, and that the Practitioner or I may terminate the telemedicine visit at any time. I understand that I have the right to inspect all information obtained and/or recorded in the course of the telemedicine visit and may receive copies of available information for a reasonable fee.  I understand that some of the potential risks of receiving the Services via telemedicine include:  Delay or interruption in medical evaluation due to technological equipment failure or disruption; Information transmitted may not be sufficient (e.g. poor resolution of images) to allow for appropriate medical decision making by the Practitioner;  and/or  In rare instances, security protocols could fail, causing a breach of personal health information.  Furthermore, I acknowledge that it is my responsibility to provide information about my medical history, conditions and care that is complete and accurate to the best of my ability. I acknowledge that Practitioner's advice, recommendations, and/or decision may be based on factors not within their control, such as incomplete or inaccurate data provided by me or distortions of diagnostic images or specimens that may result from electronic transmissions. I understand that the practice of medicine is not an exact science and that Practitioner makes no warranties or guarantees regarding treatment outcomes. I acknowledge that a copy of this consent can be made available to me via my patient portal [facility] MyChart), or I can request a printed copy by calling the office of [facility].    I understand that my insurance will be billed for this visit.   I have read or had this consent read to me. I understand the contents of this consent, which adequately explains the benefits and risks of the Services being provided via telemedicine.  I have been provided ample opportunity to ask questions regarding this consent and the Services and have had my questions answered to my satisfaction. I give my informed consent for the services to be provided through the use of telemedicine in my medical care

## 2022-11-01 NOTE — Telephone Encounter (Signed)
..     Pre-operative Risk Assessment    Patient Name: Wayne [name]  DOB: 1950-03-29 MRN: [id number]      Request for Surgical Clearance    Procedure:   LT TOTAL KNEE ARTHROPLASTY  Date of Surgery:  Clearance 01/27/23                                 Surgeon:  DR Ollen Gross Surgeon's Group or Practice Name:  [facility] Phone number:  [phone] Fax number:  [phone]   Type of Clearance Requested:   - Medical  - Pharmacy:  Hold Clopidogrel (Plavix)     Type of Anesthesia:  General    Additional requests/questions:   LAST O/V 02/28/22 WITH VARANASI, 01/25/22 TURNER. NEXT APPT NOT SCHEDULE  Signed, Renee Ramus   11/01/2022, 11:31 AM

## 2022-11-01 NOTE — Telephone Encounter (Signed)
Spoke with patient who is agreeable to do a tele visit on 12/19 at 9 am. Consent given, med rec to be completed.

## 2022-11-01 NOTE — Telephone Encounter (Signed)
   Name: [name]  DOB: [birth year]  MRN: [id number]  Primary Cardiologist: Lance Muss, MD   Preoperative team, please contact this patient and set up a phone call appointment for further preoperative risk assessment. Please obtain consent and complete medication review. Thank you for your help.  I confirm that guidance regarding antiplatelet and oral anticoagulation therapy has been completed and, if necessary, noted below.  Per office protocol, he may hold Plavix for 5 days prior to procedure and should resume as soon as hemodynamically stable postoperatively. (Patient has a long stent in RCA 3.0 x 32. Dr. Eldridge Dace has approved Plavix hold in the past). Ideally aspirin should be continued without interruption, however if the bleeding risk is too great, aspirin may be held for 5-7 days prior to surgery. Please resume aspirin post operatively when it is felt to be safe from a bleeding standpoint.    I also confirmed the patient resides in the state of [place]. As per [facility] Medical Board telemedicine laws, the patient must reside in the state in which the provider is licensed.   Carlos Levering, NP 11/01/2022, 11:39 AM [facility]

## 2022-11-10 ENCOUNTER — Other Ambulatory Visit: Payer: Self-pay | Admitting: Cardiology

## 2022-11-13 DIAGNOSIS — Z6833 Body mass index (BMI) 33.0-33.9, adult: Secondary | ICD-10-CM | POA: Diagnosis not present

## 2022-11-13 DIAGNOSIS — M5416 Radiculopathy, lumbar region: Secondary | ICD-10-CM | POA: Diagnosis not present

## 2022-12-03 DIAGNOSIS — G4700 Insomnia, unspecified: Secondary | ICD-10-CM | POA: Diagnosis not present

## 2022-12-03 DIAGNOSIS — F329 Major depressive disorder, single episode, unspecified: Secondary | ICD-10-CM | POA: Diagnosis not present

## 2022-12-03 DIAGNOSIS — G894 Chronic pain syndrome: Secondary | ICD-10-CM | POA: Diagnosis not present

## 2022-12-03 DIAGNOSIS — G609 Hereditary and idiopathic neuropathy, unspecified: Secondary | ICD-10-CM | POA: Diagnosis not present

## 2022-12-06 DIAGNOSIS — M1712 Unilateral primary osteoarthritis, left knee: Secondary | ICD-10-CM | POA: Diagnosis not present

## 2022-12-17 DIAGNOSIS — H35373 Puckering of macula, bilateral: Secondary | ICD-10-CM | POA: Diagnosis not present

## 2022-12-17 DIAGNOSIS — Z961 Presence of intraocular lens: Secondary | ICD-10-CM | POA: Diagnosis not present

## 2022-12-17 DIAGNOSIS — Z9849 Cataract extraction status, unspecified eye: Secondary | ICD-10-CM | POA: Diagnosis not present

## 2022-12-17 DIAGNOSIS — H31003 Unspecified chorioretinal scars, bilateral: Secondary | ICD-10-CM | POA: Diagnosis not present

## 2022-12-30 DIAGNOSIS — G894 Chronic pain syndrome: Secondary | ICD-10-CM | POA: Diagnosis not present

## 2022-12-30 DIAGNOSIS — G4700 Insomnia, unspecified: Secondary | ICD-10-CM | POA: Diagnosis not present

## 2022-12-30 DIAGNOSIS — F329 Major depressive disorder, single episode, unspecified: Secondary | ICD-10-CM | POA: Diagnosis not present

## 2022-12-30 DIAGNOSIS — G609 Hereditary and idiopathic neuropathy, unspecified: Secondary | ICD-10-CM | POA: Diagnosis not present

## 2023-01-02 ENCOUNTER — Ambulatory Visit: Payer: Medicare HMO

## 2023-01-16 DIAGNOSIS — G4733 Obstructive sleep apnea (adult) (pediatric): Secondary | ICD-10-CM | POA: Diagnosis not present

## 2023-01-27 ENCOUNTER — Ambulatory Visit (HOSPITAL_COMMUNITY): Admit: 2023-01-27 | Payer: Medicare HMO | Admitting: Orthopedic Surgery

## 2023-01-27 SURGERY — ARTHROPLASTY, KNEE, TOTAL
Anesthesia: Choice | Site: Knee | Laterality: Left

## 2023-02-04 DIAGNOSIS — G609 Hereditary and idiopathic neuropathy, unspecified: Secondary | ICD-10-CM | POA: Diagnosis not present

## 2023-02-04 DIAGNOSIS — F329 Major depressive disorder, single episode, unspecified: Secondary | ICD-10-CM | POA: Diagnosis not present

## 2023-02-04 DIAGNOSIS — G4700 Insomnia, unspecified: Secondary | ICD-10-CM | POA: Diagnosis not present

## 2023-02-04 DIAGNOSIS — Z79891 Long term (current) use of opiate analgesic: Secondary | ICD-10-CM | POA: Diagnosis not present

## 2023-02-04 DIAGNOSIS — G894 Chronic pain syndrome: Secondary | ICD-10-CM | POA: Diagnosis not present

## 2023-02-05 DIAGNOSIS — G894 Chronic pain syndrome: Secondary | ICD-10-CM | POA: Diagnosis not present

## 2023-02-05 DIAGNOSIS — Z79891 Long term (current) use of opiate analgesic: Secondary | ICD-10-CM | POA: Diagnosis not present

## 2023-02-10 ENCOUNTER — Other Ambulatory Visit: Payer: Self-pay | Admitting: Cardiology

## 2023-03-01 NOTE — Progress Notes (Unsigned)
Cardiology Office Note:   Date:  03/07/2023  ID:  Wayne [name], DOB 31-Jul-1950, MRN [id number] PCP:  Blair Heys, MD (Inactive)  [facility] Providers Cardiologist:  Alverda Skeans, MD Referring MD: No ref. provider found  Chief Complaint/Reason for Referral: Follow-up for coronary artery disease ASSESSMENT:    1. Coronary artery disease involving native coronary artery of native heart without angina pectoris   2. Essential hypertension, benign   3. Hyperlipidemia LDL goal <70   4. Aortic atherosclerosis (HCC)   5. LBBB (left bundle branch block)   6. OSA (obstructive sleep apnea)   7. BMI 34.0-34.9,adult     PLAN:   In order of problems listed above: Coronary artery disease: Continue Plavix 75 mg daily, metoprolol succinate 25 mg daily, Crestor 40 mg and as needed nitroglycerin.  Refill sublingual nitroglycerin. Essential hypertension: Blood pressure is well-controlled today; continue current management with lisinopril 5 mg and Toprol 25 mg in the morning and 12.5 mg at nighttime. Hyperlipidemia: Check lipid panel, LFTs, LP(a) today. Aortic atherosclerosis: Continue Plavix 75 mg daily and Crestor 40 mg daily.  BP control. Left branch block: Monitor for now. OSA:  Continue current therapy. Elevated BMI:  Diet and exercise modification.           Dispo:  Return in about 1 year (around 03/06/2024).      Medication Adjustments/Labs and Tests Ordered: Current medicines are reviewed at length with the patient today.  Concerns regarding medicines are outlined above.  The following changes have been made:  no change   Labs/tests ordered: Orders Placed This Encounter  Procedures   Lipid panel   Hepatic function panel   Lipoprotein A (LPA)    Medication Changes: Meds ordered this encounter  Medications   nitroGLYCERIN (NITROSTAT) 0.4 MG SL tablet    Sig: Dissolve one tablet under tongue every 5 minutes as needed for chest pain; up to 3 doses--call 911 if pain persist     Dispense:  25 tablet    Refill:  6    Current medicines are reviewed at length with the patient today.  The patient does not have concerns regarding medicines.  I spent 32 minutes reviewing all clinical data during and prior to this visit including all relevant imaging studies, laboratories, clinical information from other health systems and prior notes from both Cardiology and other specialties, interviewing the patient, conducting a complete physical examination, and coordinating care in order to formulate a comprehensive and personalized evaluation and treatment plan.   History of Present Illness:      FOCUSED PROBLEM LIST:   CAD ACS status post remote multivessel stenting 2011 PET stress no ischemia 2024 Hypertension Hyperlipidemia Aortic atherosclerosis PET stress test CT 2024 LBBB OSA On inspire apnea device Follows with Dr. Mayford Knife BMI 34  Discussed the use of AI scribe software for clinical note transcription with the patient, who gave verbal consent to proceed.  History of Present Illness       2/25: The patient is here for routine follow-up.  Was seen about a year ago and due to atypical chest pain he was referred for PET stress test which showed no ischemia.  He has a history of coronary artery disease with stents placed in multiple vessels in 2011 following a heart attack. He experiences chest pain at night, which he is unsure if it originates from his chest or back. The pain occurs when lying down and is described as slight. He has not used nitroglycerin but requests a  refill for precautionary purposes. No palpitations or shortness of breath when lying flat, leg swelling, blacking out spells, or lightheadedness.  He has high blood pressure and is on medication for it, although he does not regularly check his blood pressure at home due to a non-functional monitor with corroded batteries.  He has high cholesterol and is on medication for it.  He has sleep apnea and  uses the [place] device, which he feels has not been effective. Recently, he was fitted with a dental apparatus that he believes is working well to alleviate his symptoms.  Socially, he is semi-retired, working as a Interior and spatial designer, and enjoys activities such as Armed forces training and education officer. He acknowledges the need to lose weight but finds it challenging due to his lifestyle and work demands.         Current Medications: Current Meds  Medication Sig   clopidogrel (PLAVIX) 75 MG tablet TAKE ONE TABLET BY MOUTH DAILY   cyclobenzaprine (FLEXERIL) 10 MG tablet Take 10 mg by mouth 3 (three) times daily as needed for muscle spasms.   diclofenac Sodium (VOLTAREN) 1 % GEL Apply 1 application. topically 2 (two) times daily as needed (pain).   docusate sodium (COLACE) 100 MG capsule Take 100 mg by mouth daily as needed for moderate constipation.   DULoxetine (CYMBALTA) 30 MG capsule Take 30 mg by mouth daily.   DULoxetine (CYMBALTA) 60 MG capsule Take 60 mg by mouth 2 (two) times daily.   lisinopril (ZESTRIL) 5 MG tablet TAKE ONE TABLET BY MOUTH DAILY   metoprolol succinate (TOPROL-XL) 25 MG 24 hr tablet TAKE 1 TABLET BY MOUTH EVERY MORNING AND TAKE A HALF TABLET BY MOUTH EVERY EVENING   Omega-3 Fatty Acids (FISH OIL) 1000 MG CAPS Take 2 capsules (2,000 mg total) by mouth daily.   oxyCODONE-acetaminophen (PERCOCET) 10-325 MG per tablet Take 1 tablet every 6 (six) hours as needed by mouth for pain.    pregabalin (LYRICA) 150 MG capsule Take 150 mg by mouth 2 (two) times daily.   rosuvastatin (CRESTOR) 40 MG tablet TAKE ONE TABLET BY MOUTH EVERY EVENING   zolpidem (AMBIEN) 10 MG tablet Take 10 mg by mouth at bedtime.   [DISCONTINUED] nitroGLYCERIN (NITROSTAT) 0.4 MG SL tablet Dissolve one tablet under tongue every 5 minutes as needed for chest pain; up to 3 doses--call 911 if pain persist     Review of Systems:   Please see the history of present illness.    All other systems reviewed and are negative.      EKGs/Labs/Other Test Reviewed:   EKG: EKG from November 2023 demonstrates sinus rhythm with left bundle branch block  EKG Interpretation Date/Time:    Ventricular Rate:    PR Interval:    QRS Duration:    QT Interval:    QTC Calculation:   R Axis:      Text Interpretation:           Risk Assessment/Calculations:     STOP-Bang Score:          Physical Exam:   VS:  BP 130/70   Pulse 60   Ht 5\' 10"  (1.778 m)   Wt 235 lb 3.2 oz (106.7 kg)   SpO2 97%   BMI 33.75 kg/m        Wt Readings from Last 3 Encounters:  03/07/23 235 lb 3.2 oz (106.7 kg)  02/28/22 238 lb (108 kg)  01/25/22 233 lb 9.6 oz (106 kg)      GENERAL:  No apparent  distress, AOx3 HEENT:  No carotid bruits, +2 carotid impulses, no scleral icterus CAR: RRR no murmurs, gallops, rubs, or thrills RES:  Clear to auscultation bilaterally ABD:  Soft, nontender, nondistended, positive bowel sounds x 4 VASC:  +2 radial pulses, +2 carotid pulses NEURO:  CN 2-12 grossly intact; motor and sensory grossly intact PSYCH:  No active depression or anxiety EXT:  No edema, ecchymosis, or cyanosis  Signed, Orbie Pyo, MD  03/07/2023 10:24 AM    [facility] [address] [place], [place], [place]  [address] Phone: [phone]; Fax: [phone]   Note:  This document was prepared using Dragon voice recognition software and may include unintentional dictation errors.

## 2023-03-07 ENCOUNTER — Other Ambulatory Visit: Payer: Self-pay | Admitting: Internal Medicine

## 2023-03-07 ENCOUNTER — Ambulatory Visit: Payer: Medicare HMO | Attending: Internal Medicine | Admitting: Internal Medicine

## 2023-03-07 ENCOUNTER — Encounter: Payer: Self-pay | Admitting: Internal Medicine

## 2023-03-07 VITALS — BP 130/70 | HR 60 | Ht 70.0 in | Wt 235.2 lb

## 2023-03-07 DIAGNOSIS — I447 Left bundle-branch block, unspecified: Secondary | ICD-10-CM | POA: Diagnosis not present

## 2023-03-07 DIAGNOSIS — Z6834 Body mass index (BMI) 34.0-34.9, adult: Secondary | ICD-10-CM

## 2023-03-07 DIAGNOSIS — I2510 Atherosclerotic heart disease of native coronary artery without angina pectoris: Secondary | ICD-10-CM

## 2023-03-07 DIAGNOSIS — I10 Essential (primary) hypertension: Secondary | ICD-10-CM

## 2023-03-07 DIAGNOSIS — I700 Atherosclerosis of aorta: Secondary | ICD-10-CM | POA: Diagnosis not present

## 2023-03-07 DIAGNOSIS — E785 Hyperlipidemia, unspecified: Secondary | ICD-10-CM

## 2023-03-07 DIAGNOSIS — G4733 Obstructive sleep apnea (adult) (pediatric): Secondary | ICD-10-CM | POA: Diagnosis not present

## 2023-03-07 MED ORDER — NITROGLYCERIN 0.4 MG SL SUBL: SUBLINGUAL_TABLET | SUBLINGUAL | 6 refills | Status: AC

## 2023-03-07 NOTE — Patient Instructions (Signed)
Medication Instructions:  Your physician recommends that you continue on your current medications as directed. Please refer to the Current Medication list given to you today.  *If you need a refill on your cardiac medications before your next appointment, please call your pharmacy*  Lab Work: TODAY: Lipid panel, LFTs, LP(a) If you have labs (blood work) drawn today and your tests are completely normal, you will receive your results only by: MyChart Message (if you have MyChart) OR A paper copy in the mail If you have any lab test that is abnormal or we need to change your treatment, we will call you to review the results.  Follow-Up: At [facility], you and your health needs are our priority.  As part of our continuing mission to provide you with exceptional heart care, we have created designated Provider Care Teams.  These Care Teams include your primary Cardiologist (physician) and Advanced Practice Providers (APPs -  Physician Assistants and Nurse Practitioners) who all work together to provide you with the care you need, when you need it.  Your next appointment:   1 year(s)  The format for your next appointment:   In Person  Provider:   Orbie Pyo, MD {  Other Instructions   1st Floor: - Lobby - Registration  - Pharmacy  - Lab - Cafe  2nd Floor: - PV Lab - Diagnostic Testing (echo, CT, nuclear med)  3rd Floor: - Vacant  4th Floor: - TCTS (cardiothoracic surgery) - AFib Clinic - Structural Heart Clinic - Vascular Surgery  - Vascular Ultrasound  5th Floor: - [facility] (general and EP) - Clinical Pharmacy for coumadin, hypertension, lipid, weight-loss medications, and med management appointments    Valet parking services will be available as well.

## 2023-03-10 ENCOUNTER — Other Ambulatory Visit: Payer: Self-pay

## 2023-03-10 ENCOUNTER — Encounter: Payer: Self-pay | Admitting: Internal Medicine

## 2023-03-10 DIAGNOSIS — F329 Major depressive disorder, single episode, unspecified: Secondary | ICD-10-CM | POA: Diagnosis not present

## 2023-03-10 DIAGNOSIS — G4700 Insomnia, unspecified: Secondary | ICD-10-CM | POA: Diagnosis not present

## 2023-03-10 DIAGNOSIS — E785 Hyperlipidemia, unspecified: Secondary | ICD-10-CM

## 2023-03-10 DIAGNOSIS — G609 Hereditary and idiopathic neuropathy, unspecified: Secondary | ICD-10-CM | POA: Diagnosis not present

## 2023-03-10 DIAGNOSIS — G894 Chronic pain syndrome: Secondary | ICD-10-CM | POA: Diagnosis not present

## 2023-03-10 MED ORDER — EZETIMIBE 10 MG PO TABS: 10.0000 mg | ORAL_TABLET | Freq: Every day | ORAL | 3 refills | Status: DC

## 2023-03-11 LAB — HEPATIC FUNCTION PANEL
ALT: 20 [IU]/L (ref 0–44)
AST: 25 [IU]/L (ref 0–40)
Albumin: 4.3 g/dL (ref 3.8–4.8)
Alkaline Phosphatase: 87 [IU]/L (ref 44–121)
Bilirubin Total: 0.4 mg/dL (ref 0.0–1.2)
Bilirubin, Direct: 0.13 mg/dL (ref 0.00–0.40)
Total Protein: 7.1 g/dL (ref 6.0–8.5)

## 2023-03-11 LAB — LIPID PANEL
Chol/HDL Ratio: 3 {ratio} (ref 0.0–5.0)
Cholesterol, Total: 134 mg/dL (ref 100–199)
HDL: 44 mg/dL (ref >39)
LDL Chol Calc (NIH): 61 mg/dL (ref 0–99)
Triglycerides: 176 mg/dL — ABNORMAL HIGH (ref 0–149)
VLDL Cholesterol Cal: 29 mg/dL (ref 5–40)

## 2023-03-11 LAB — LIPOPROTEIN A (LPA): Lipoprotein (a): 18.5 nmol/L (ref <75.0)

## 2023-03-19 ENCOUNTER — Other Ambulatory Visit: Payer: Self-pay | Admitting: Interventional Cardiology

## 2023-03-31 ENCOUNTER — Other Ambulatory Visit: Payer: Self-pay | Admitting: Interventional Cardiology

## 2023-04-10 DIAGNOSIS — G609 Hereditary and idiopathic neuropathy, unspecified: Secondary | ICD-10-CM | POA: Diagnosis not present

## 2023-04-10 DIAGNOSIS — G4700 Insomnia, unspecified: Secondary | ICD-10-CM | POA: Diagnosis not present

## 2023-04-10 DIAGNOSIS — F329 Major depressive disorder, single episode, unspecified: Secondary | ICD-10-CM | POA: Diagnosis not present

## 2023-04-10 DIAGNOSIS — G894 Chronic pain syndrome: Secondary | ICD-10-CM | POA: Diagnosis not present

## 2023-04-17 DIAGNOSIS — D225 Melanocytic nevi of trunk: Secondary | ICD-10-CM | POA: Diagnosis not present

## 2023-04-17 DIAGNOSIS — L821 Other seborrheic keratosis: Secondary | ICD-10-CM | POA: Diagnosis not present

## 2023-04-17 DIAGNOSIS — L570 Actinic keratosis: Secondary | ICD-10-CM | POA: Diagnosis not present

## 2023-04-17 DIAGNOSIS — L814 Other melanin hyperpigmentation: Secondary | ICD-10-CM | POA: Diagnosis not present

## 2023-05-09 ENCOUNTER — Other Ambulatory Visit: Payer: Self-pay | Admitting: Interventional Cardiology

## 2023-05-09 ENCOUNTER — Other Ambulatory Visit: Payer: Self-pay | Admitting: Cardiology

## 2023-05-12 DIAGNOSIS — G894 Chronic pain syndrome: Secondary | ICD-10-CM | POA: Diagnosis not present

## 2023-05-12 DIAGNOSIS — G609 Hereditary and idiopathic neuropathy, unspecified: Secondary | ICD-10-CM | POA: Diagnosis not present

## 2023-05-12 DIAGNOSIS — F329 Major depressive disorder, single episode, unspecified: Secondary | ICD-10-CM | POA: Diagnosis not present

## 2023-05-12 DIAGNOSIS — G4700 Insomnia, unspecified: Secondary | ICD-10-CM | POA: Diagnosis not present

## 2023-05-21 ENCOUNTER — Telehealth (HOSPITAL_BASED_OUTPATIENT_CLINIC_OR_DEPARTMENT_OTHER): Payer: Self-pay | Admitting: *Deleted

## 2023-05-21 NOTE — Telephone Encounter (Signed)
   Pre-operative Risk Assessment    Patient Name: Wayne [name]  DOB: [birth year] MRN: [id number]   Date of last office visit: 03/07/2023 Date of next office visit: None  Request for Surgical Clearance    Procedure:  Lumbar Epidural   Date of Surgery:  Clearance TBD                                 Surgeon:  Nestora Baptise Surgeon's Group or Practice Name:  [facility] Phone number:  [phone] Fax number:  [phone]   Type of Clearance Requested:   - Medical  - Pharmacy:  Hold Clopidogrel  (Plavix ) Not Indicated   Type of Anesthesia:  Not Indicated   Additional requests/questions:    Signed, Lauris Port   05/21/2023, 2:36 PM

## 2023-05-22 NOTE — Telephone Encounter (Signed)
   Name: Wayne [name]  DOB: July 01, 1950  MRN: [id number]   Primary Cardiologist: Arun K Thukkani, MD  Chart reviewed as part of pre-operative protocol coverage. Wayne [name] was last seen on 03/07/2023 by Dr. Lorie Rook.  Per Dr. Lorie Rook "Epidural injection is a low risk procedure in regards to CV risk.  Stop plavix , start asa 81.  Cont asa 81 during surgical period.  After surgery, stop asa 81 and restart plavix  75."  Therefore, based on ACC/AHA guidelines, the patient would be an acceptable risk for the planned procedure without further cardiovascular testing.   I will route this recommendation to the requesting party via Epic fax function and remove from pre-op pool. Please call with questions.  Morey Ar, NP 05/22/2023, 2:19 PM

## 2023-05-28 DIAGNOSIS — M1712 Unilateral primary osteoarthritis, left knee: Secondary | ICD-10-CM | POA: Diagnosis not present

## 2023-06-05 ENCOUNTER — Other Ambulatory Visit: Payer: Self-pay | Admitting: Internal Medicine

## 2023-06-05 DIAGNOSIS — M1712 Unilateral primary osteoarthritis, left knee: Secondary | ICD-10-CM | POA: Diagnosis not present

## 2023-06-10 DIAGNOSIS — F329 Major depressive disorder, single episode, unspecified: Secondary | ICD-10-CM | POA: Diagnosis not present

## 2023-06-10 DIAGNOSIS — G4700 Insomnia, unspecified: Secondary | ICD-10-CM | POA: Diagnosis not present

## 2023-06-10 DIAGNOSIS — G894 Chronic pain syndrome: Secondary | ICD-10-CM | POA: Diagnosis not present

## 2023-06-10 DIAGNOSIS — M5416 Radiculopathy, lumbar region: Secondary | ICD-10-CM | POA: Diagnosis not present

## 2023-06-10 DIAGNOSIS — G609 Hereditary and idiopathic neuropathy, unspecified: Secondary | ICD-10-CM | POA: Diagnosis not present

## 2023-06-12 DIAGNOSIS — M1712 Unilateral primary osteoarthritis, left knee: Secondary | ICD-10-CM | POA: Diagnosis not present

## 2023-07-14 DIAGNOSIS — G4700 Insomnia, unspecified: Secondary | ICD-10-CM | POA: Diagnosis not present

## 2023-07-14 DIAGNOSIS — F329 Major depressive disorder, single episode, unspecified: Secondary | ICD-10-CM | POA: Diagnosis not present

## 2023-07-14 DIAGNOSIS — G894 Chronic pain syndrome: Secondary | ICD-10-CM | POA: Diagnosis not present

## 2023-07-14 DIAGNOSIS — G609 Hereditary and idiopathic neuropathy, unspecified: Secondary | ICD-10-CM | POA: Diagnosis not present

## 2023-07-24 DIAGNOSIS — G4700 Insomnia, unspecified: Secondary | ICD-10-CM | POA: Diagnosis not present

## 2023-07-24 DIAGNOSIS — Z85828 Personal history of other malignant neoplasm of skin: Secondary | ICD-10-CM | POA: Diagnosis not present

## 2023-07-24 DIAGNOSIS — I700 Atherosclerosis of aorta: Secondary | ICD-10-CM | POA: Diagnosis not present

## 2023-07-24 DIAGNOSIS — I25119 Atherosclerotic heart disease of native coronary artery with unspecified angina pectoris: Secondary | ICD-10-CM | POA: Diagnosis not present

## 2023-07-24 DIAGNOSIS — M4800 Spinal stenosis, site unspecified: Secondary | ICD-10-CM | POA: Diagnosis not present

## 2023-07-24 DIAGNOSIS — M1990 Unspecified osteoarthritis, unspecified site: Secondary | ICD-10-CM | POA: Diagnosis not present

## 2023-07-24 DIAGNOSIS — I252 Old myocardial infarction: Secondary | ICD-10-CM | POA: Diagnosis not present

## 2023-07-24 DIAGNOSIS — Z809 Family history of malignant neoplasm, unspecified: Secondary | ICD-10-CM | POA: Diagnosis not present

## 2023-07-24 DIAGNOSIS — G4733 Obstructive sleep apnea (adult) (pediatric): Secondary | ICD-10-CM | POA: Diagnosis not present

## 2023-07-24 DIAGNOSIS — E785 Hyperlipidemia, unspecified: Secondary | ICD-10-CM | POA: Diagnosis not present

## 2023-07-24 DIAGNOSIS — N529 Male erectile dysfunction, unspecified: Secondary | ICD-10-CM | POA: Diagnosis not present

## 2023-07-24 DIAGNOSIS — I10 Essential (primary) hypertension: Secondary | ICD-10-CM | POA: Diagnosis not present

## 2023-08-18 DIAGNOSIS — G609 Hereditary and idiopathic neuropathy, unspecified: Secondary | ICD-10-CM | POA: Diagnosis not present

## 2023-08-18 DIAGNOSIS — G4700 Insomnia, unspecified: Secondary | ICD-10-CM | POA: Diagnosis not present

## 2023-08-18 DIAGNOSIS — G894 Chronic pain syndrome: Secondary | ICD-10-CM | POA: Diagnosis not present

## 2023-08-18 DIAGNOSIS — F329 Major depressive disorder, single episode, unspecified: Secondary | ICD-10-CM | POA: Diagnosis not present

## 2023-09-09 DIAGNOSIS — I25119 Atherosclerotic heart disease of native coronary artery with unspecified angina pectoris: Secondary | ICD-10-CM | POA: Diagnosis not present

## 2023-09-09 DIAGNOSIS — G4733 Obstructive sleep apnea (adult) (pediatric): Secondary | ICD-10-CM | POA: Diagnosis not present

## 2023-09-09 DIAGNOSIS — Z125 Encounter for screening for malignant neoplasm of prostate: Secondary | ICD-10-CM | POA: Diagnosis not present

## 2023-09-09 DIAGNOSIS — G629 Polyneuropathy, unspecified: Secondary | ICD-10-CM | POA: Diagnosis not present

## 2023-09-09 DIAGNOSIS — R7303 Prediabetes: Secondary | ICD-10-CM | POA: Diagnosis not present

## 2023-09-09 DIAGNOSIS — E782 Mixed hyperlipidemia: Secondary | ICD-10-CM | POA: Diagnosis not present

## 2023-09-09 DIAGNOSIS — M25559 Pain in unspecified hip: Secondary | ICD-10-CM | POA: Diagnosis not present

## 2023-09-09 DIAGNOSIS — I10 Essential (primary) hypertension: Secondary | ICD-10-CM | POA: Diagnosis not present

## 2023-09-09 DIAGNOSIS — F5104 Psychophysiologic insomnia: Secondary | ICD-10-CM | POA: Diagnosis not present

## 2023-09-09 DIAGNOSIS — Z23 Encounter for immunization: Secondary | ICD-10-CM | POA: Diagnosis not present

## 2023-09-09 DIAGNOSIS — Z0000 Encounter for general adult medical examination without abnormal findings: Secondary | ICD-10-CM | POA: Diagnosis not present

## 2023-09-09 DIAGNOSIS — Z1331 Encounter for screening for depression: Secondary | ICD-10-CM | POA: Diagnosis not present

## 2023-09-17 DIAGNOSIS — G4700 Insomnia, unspecified: Secondary | ICD-10-CM | POA: Diagnosis not present

## 2023-09-17 DIAGNOSIS — F329 Major depressive disorder, single episode, unspecified: Secondary | ICD-10-CM | POA: Diagnosis not present

## 2023-09-17 DIAGNOSIS — G609 Hereditary and idiopathic neuropathy, unspecified: Secondary | ICD-10-CM | POA: Diagnosis not present

## 2023-09-17 DIAGNOSIS — G894 Chronic pain syndrome: Secondary | ICD-10-CM | POA: Diagnosis not present

## 2023-09-17 DIAGNOSIS — Z79891 Long term (current) use of opiate analgesic: Secondary | ICD-10-CM | POA: Diagnosis not present

## 2023-09-18 DIAGNOSIS — Z79891 Long term (current) use of opiate analgesic: Secondary | ICD-10-CM | POA: Diagnosis not present

## 2023-09-18 DIAGNOSIS — G894 Chronic pain syndrome: Secondary | ICD-10-CM | POA: Diagnosis not present

## 2023-10-07 DIAGNOSIS — M9901 Segmental and somatic dysfunction of cervical region: Secondary | ICD-10-CM | POA: Diagnosis not present

## 2023-10-07 DIAGNOSIS — M5126 Other intervertebral disc displacement, lumbar region: Secondary | ICD-10-CM | POA: Diagnosis not present

## 2023-10-07 DIAGNOSIS — M5124 Other intervertebral disc displacement, thoracic region: Secondary | ICD-10-CM | POA: Diagnosis not present

## 2023-10-07 DIAGNOSIS — M9902 Segmental and somatic dysfunction of thoracic region: Secondary | ICD-10-CM | POA: Diagnosis not present

## 2023-10-07 DIAGNOSIS — M9903 Segmental and somatic dysfunction of lumbar region: Secondary | ICD-10-CM | POA: Diagnosis not present

## 2023-10-07 DIAGNOSIS — M5022 Other cervical disc displacement, mid-cervical region, unspecified level: Secondary | ICD-10-CM | POA: Diagnosis not present

## 2023-10-08 DIAGNOSIS — M9901 Segmental and somatic dysfunction of cervical region: Secondary | ICD-10-CM | POA: Diagnosis not present

## 2023-10-08 DIAGNOSIS — M5126 Other intervertebral disc displacement, lumbar region: Secondary | ICD-10-CM | POA: Diagnosis not present

## 2023-10-08 DIAGNOSIS — M5124 Other intervertebral disc displacement, thoracic region: Secondary | ICD-10-CM | POA: Diagnosis not present

## 2023-10-08 DIAGNOSIS — M5022 Other cervical disc displacement, mid-cervical region, unspecified level: Secondary | ICD-10-CM | POA: Diagnosis not present

## 2023-10-08 DIAGNOSIS — M9902 Segmental and somatic dysfunction of thoracic region: Secondary | ICD-10-CM | POA: Diagnosis not present

## 2023-10-08 DIAGNOSIS — M9903 Segmental and somatic dysfunction of lumbar region: Secondary | ICD-10-CM | POA: Diagnosis not present

## 2023-10-13 DIAGNOSIS — M9902 Segmental and somatic dysfunction of thoracic region: Secondary | ICD-10-CM | POA: Diagnosis not present

## 2023-10-13 DIAGNOSIS — M9901 Segmental and somatic dysfunction of cervical region: Secondary | ICD-10-CM | POA: Diagnosis not present

## 2023-10-13 DIAGNOSIS — M5124 Other intervertebral disc displacement, thoracic region: Secondary | ICD-10-CM | POA: Diagnosis not present

## 2023-10-13 DIAGNOSIS — M5022 Other cervical disc displacement, mid-cervical region, unspecified level: Secondary | ICD-10-CM | POA: Diagnosis not present

## 2023-10-13 DIAGNOSIS — M5126 Other intervertebral disc displacement, lumbar region: Secondary | ICD-10-CM | POA: Diagnosis not present

## 2023-10-13 DIAGNOSIS — M9903 Segmental and somatic dysfunction of lumbar region: Secondary | ICD-10-CM | POA: Diagnosis not present

## 2023-10-15 DIAGNOSIS — M5124 Other intervertebral disc displacement, thoracic region: Secondary | ICD-10-CM | POA: Diagnosis not present

## 2023-10-15 DIAGNOSIS — M5022 Other cervical disc displacement, mid-cervical region, unspecified level: Secondary | ICD-10-CM | POA: Diagnosis not present

## 2023-10-15 DIAGNOSIS — M9902 Segmental and somatic dysfunction of thoracic region: Secondary | ICD-10-CM | POA: Diagnosis not present

## 2023-10-15 DIAGNOSIS — M9903 Segmental and somatic dysfunction of lumbar region: Secondary | ICD-10-CM | POA: Diagnosis not present

## 2023-10-15 DIAGNOSIS — M5126 Other intervertebral disc displacement, lumbar region: Secondary | ICD-10-CM | POA: Diagnosis not present

## 2023-10-15 DIAGNOSIS — M9901 Segmental and somatic dysfunction of cervical region: Secondary | ICD-10-CM | POA: Diagnosis not present

## 2023-10-27 DIAGNOSIS — M9901 Segmental and somatic dysfunction of cervical region: Secondary | ICD-10-CM | POA: Diagnosis not present

## 2023-10-27 DIAGNOSIS — M5124 Other intervertebral disc displacement, thoracic region: Secondary | ICD-10-CM | POA: Diagnosis not present

## 2023-10-27 DIAGNOSIS — M9902 Segmental and somatic dysfunction of thoracic region: Secondary | ICD-10-CM | POA: Diagnosis not present

## 2023-10-27 DIAGNOSIS — M5126 Other intervertebral disc displacement, lumbar region: Secondary | ICD-10-CM | POA: Diagnosis not present

## 2023-10-27 DIAGNOSIS — M5022 Other cervical disc displacement, mid-cervical region, unspecified level: Secondary | ICD-10-CM | POA: Diagnosis not present

## 2023-10-27 DIAGNOSIS — M9903 Segmental and somatic dysfunction of lumbar region: Secondary | ICD-10-CM | POA: Diagnosis not present

## 2023-10-29 DIAGNOSIS — M5124 Other intervertebral disc displacement, thoracic region: Secondary | ICD-10-CM | POA: Diagnosis not present

## 2023-10-29 DIAGNOSIS — M5126 Other intervertebral disc displacement, lumbar region: Secondary | ICD-10-CM | POA: Diagnosis not present

## 2023-10-29 DIAGNOSIS — M9903 Segmental and somatic dysfunction of lumbar region: Secondary | ICD-10-CM | POA: Diagnosis not present

## 2023-10-29 DIAGNOSIS — M9901 Segmental and somatic dysfunction of cervical region: Secondary | ICD-10-CM | POA: Diagnosis not present

## 2023-10-29 DIAGNOSIS — M5022 Other cervical disc displacement, mid-cervical region, unspecified level: Secondary | ICD-10-CM | POA: Diagnosis not present

## 2023-10-29 DIAGNOSIS — M9902 Segmental and somatic dysfunction of thoracic region: Secondary | ICD-10-CM | POA: Diagnosis not present

## 2023-11-03 DIAGNOSIS — M5022 Other cervical disc displacement, mid-cervical region, unspecified level: Secondary | ICD-10-CM | POA: Diagnosis not present

## 2023-11-03 DIAGNOSIS — M9903 Segmental and somatic dysfunction of lumbar region: Secondary | ICD-10-CM | POA: Diagnosis not present

## 2023-11-03 DIAGNOSIS — M9902 Segmental and somatic dysfunction of thoracic region: Secondary | ICD-10-CM | POA: Diagnosis not present

## 2023-11-03 DIAGNOSIS — M5124 Other intervertebral disc displacement, thoracic region: Secondary | ICD-10-CM | POA: Diagnosis not present

## 2023-11-03 DIAGNOSIS — M5126 Other intervertebral disc displacement, lumbar region: Secondary | ICD-10-CM | POA: Diagnosis not present

## 2023-11-03 DIAGNOSIS — M9901 Segmental and somatic dysfunction of cervical region: Secondary | ICD-10-CM | POA: Diagnosis not present

## 2023-11-05 DIAGNOSIS — M9901 Segmental and somatic dysfunction of cervical region: Secondary | ICD-10-CM | POA: Diagnosis not present

## 2023-11-05 DIAGNOSIS — M9902 Segmental and somatic dysfunction of thoracic region: Secondary | ICD-10-CM | POA: Diagnosis not present

## 2023-11-05 DIAGNOSIS — M5126 Other intervertebral disc displacement, lumbar region: Secondary | ICD-10-CM | POA: Diagnosis not present

## 2023-11-05 DIAGNOSIS — M9903 Segmental and somatic dysfunction of lumbar region: Secondary | ICD-10-CM | POA: Diagnosis not present

## 2023-11-05 DIAGNOSIS — M5124 Other intervertebral disc displacement, thoracic region: Secondary | ICD-10-CM | POA: Diagnosis not present

## 2023-11-05 DIAGNOSIS — M5022 Other cervical disc displacement, mid-cervical region, unspecified level: Secondary | ICD-10-CM | POA: Diagnosis not present

## 2023-11-12 DIAGNOSIS — M9901 Segmental and somatic dysfunction of cervical region: Secondary | ICD-10-CM | POA: Diagnosis not present

## 2023-11-12 DIAGNOSIS — M5022 Other cervical disc displacement, mid-cervical region, unspecified level: Secondary | ICD-10-CM | POA: Diagnosis not present

## 2023-11-12 DIAGNOSIS — M9902 Segmental and somatic dysfunction of thoracic region: Secondary | ICD-10-CM | POA: Diagnosis not present

## 2023-11-12 DIAGNOSIS — M9903 Segmental and somatic dysfunction of lumbar region: Secondary | ICD-10-CM | POA: Diagnosis not present

## 2023-11-12 DIAGNOSIS — M5124 Other intervertebral disc displacement, thoracic region: Secondary | ICD-10-CM | POA: Diagnosis not present

## 2023-11-12 DIAGNOSIS — M5126 Other intervertebral disc displacement, lumbar region: Secondary | ICD-10-CM | POA: Diagnosis not present

## 2023-11-13 DIAGNOSIS — G894 Chronic pain syndrome: Secondary | ICD-10-CM | POA: Diagnosis not present

## 2023-11-13 DIAGNOSIS — G4700 Insomnia, unspecified: Secondary | ICD-10-CM | POA: Diagnosis not present

## 2023-11-13 DIAGNOSIS — F329 Major depressive disorder, single episode, unspecified: Secondary | ICD-10-CM | POA: Diagnosis not present

## 2023-11-13 DIAGNOSIS — G609 Hereditary and idiopathic neuropathy, unspecified: Secondary | ICD-10-CM | POA: Diagnosis not present

## 2023-11-23 ENCOUNTER — Encounter: Payer: Self-pay | Admitting: Cardiology

## 2023-11-26 DIAGNOSIS — M9902 Segmental and somatic dysfunction of thoracic region: Secondary | ICD-10-CM | POA: Diagnosis not present

## 2023-11-26 DIAGNOSIS — M9903 Segmental and somatic dysfunction of lumbar region: Secondary | ICD-10-CM | POA: Diagnosis not present

## 2023-11-26 DIAGNOSIS — M5126 Other intervertebral disc displacement, lumbar region: Secondary | ICD-10-CM | POA: Diagnosis not present

## 2023-11-26 DIAGNOSIS — M5022 Other cervical disc displacement, mid-cervical region, unspecified level: Secondary | ICD-10-CM | POA: Diagnosis not present

## 2023-11-26 DIAGNOSIS — M5124 Other intervertebral disc displacement, thoracic region: Secondary | ICD-10-CM | POA: Diagnosis not present

## 2023-11-26 DIAGNOSIS — M9901 Segmental and somatic dysfunction of cervical region: Secondary | ICD-10-CM | POA: Diagnosis not present

## 2023-12-03 DIAGNOSIS — M5126 Other intervertebral disc displacement, lumbar region: Secondary | ICD-10-CM | POA: Diagnosis not present

## 2023-12-03 DIAGNOSIS — M5022 Other cervical disc displacement, mid-cervical region, unspecified level: Secondary | ICD-10-CM | POA: Diagnosis not present

## 2023-12-03 DIAGNOSIS — M9903 Segmental and somatic dysfunction of lumbar region: Secondary | ICD-10-CM | POA: Diagnosis not present

## 2023-12-03 DIAGNOSIS — M5124 Other intervertebral disc displacement, thoracic region: Secondary | ICD-10-CM | POA: Diagnosis not present

## 2023-12-03 DIAGNOSIS — M9902 Segmental and somatic dysfunction of thoracic region: Secondary | ICD-10-CM | POA: Diagnosis not present

## 2023-12-03 DIAGNOSIS — M9901 Segmental and somatic dysfunction of cervical region: Secondary | ICD-10-CM | POA: Diagnosis not present

## 2023-12-16 ENCOUNTER — Telehealth: Payer: Self-pay

## 2023-12-16 NOTE — Telephone Encounter (Signed)
 Attempted to call Dr. Micky office several times, no answer. Received message We are sorry, your call cannot be completed at this time.

## 2023-12-17 DIAGNOSIS — M5022 Other cervical disc displacement, mid-cervical region, unspecified level: Secondary | ICD-10-CM | POA: Diagnosis not present

## 2023-12-17 DIAGNOSIS — M9903 Segmental and somatic dysfunction of lumbar region: Secondary | ICD-10-CM | POA: Diagnosis not present

## 2023-12-17 DIAGNOSIS — M9901 Segmental and somatic dysfunction of cervical region: Secondary | ICD-10-CM | POA: Diagnosis not present

## 2023-12-17 DIAGNOSIS — M9902 Segmental and somatic dysfunction of thoracic region: Secondary | ICD-10-CM | POA: Diagnosis not present

## 2023-12-17 DIAGNOSIS — M5126 Other intervertebral disc displacement, lumbar region: Secondary | ICD-10-CM | POA: Diagnosis not present

## 2023-12-17 DIAGNOSIS — M5124 Other intervertebral disc displacement, thoracic region: Secondary | ICD-10-CM | POA: Diagnosis not present

## 2023-12-18 DIAGNOSIS — F329 Major depressive disorder, single episode, unspecified: Secondary | ICD-10-CM | POA: Diagnosis not present

## 2023-12-18 DIAGNOSIS — G4700 Insomnia, unspecified: Secondary | ICD-10-CM | POA: Diagnosis not present

## 2023-12-18 DIAGNOSIS — G894 Chronic pain syndrome: Secondary | ICD-10-CM | POA: Diagnosis not present

## 2023-12-18 DIAGNOSIS — G609 Hereditary and idiopathic neuropathy, unspecified: Secondary | ICD-10-CM | POA: Diagnosis not present

## 2023-12-24 DIAGNOSIS — M9903 Segmental and somatic dysfunction of lumbar region: Secondary | ICD-10-CM | POA: Diagnosis not present

## 2023-12-24 DIAGNOSIS — M9901 Segmental and somatic dysfunction of cervical region: Secondary | ICD-10-CM | POA: Diagnosis not present

## 2023-12-24 DIAGNOSIS — M5124 Other intervertebral disc displacement, thoracic region: Secondary | ICD-10-CM | POA: Diagnosis not present

## 2023-12-24 DIAGNOSIS — M9902 Segmental and somatic dysfunction of thoracic region: Secondary | ICD-10-CM | POA: Diagnosis not present

## 2023-12-24 DIAGNOSIS — M5022 Other cervical disc displacement, mid-cervical region, unspecified level: Secondary | ICD-10-CM | POA: Diagnosis not present

## 2023-12-24 DIAGNOSIS — M5126 Other intervertebral disc displacement, lumbar region: Secondary | ICD-10-CM | POA: Diagnosis not present

## 2024-01-06 DIAGNOSIS — M9903 Segmental and somatic dysfunction of lumbar region: Secondary | ICD-10-CM | POA: Diagnosis not present

## 2024-01-06 DIAGNOSIS — M9901 Segmental and somatic dysfunction of cervical region: Secondary | ICD-10-CM | POA: Diagnosis not present

## 2024-01-06 DIAGNOSIS — M5022 Other cervical disc displacement, mid-cervical region, unspecified level: Secondary | ICD-10-CM | POA: Diagnosis not present

## 2024-01-06 DIAGNOSIS — M5126 Other intervertebral disc displacement, lumbar region: Secondary | ICD-10-CM | POA: Diagnosis not present

## 2024-01-06 DIAGNOSIS — M5124 Other intervertebral disc displacement, thoracic region: Secondary | ICD-10-CM | POA: Diagnosis not present

## 2024-01-06 DIAGNOSIS — M9902 Segmental and somatic dysfunction of thoracic region: Secondary | ICD-10-CM | POA: Diagnosis not present

## 2024-01-12 ENCOUNTER — Encounter: Payer: Self-pay | Admitting: Internal Medicine

## 2024-01-12 ENCOUNTER — Telehealth: Payer: Self-pay | Admitting: Internal Medicine

## 2024-01-12 NOTE — Telephone Encounter (Signed)
" ° °  Pt c/o of Chest Pain: STAT if active CP, including tightness, pressure, jaw pain, radiating pain to shoulder/upper arm/back, CP unrelieved by Nitro. Symptoms reported of SOB, nausea, vomiting, sweating.  1. Are you having CP right now? No.  Patient stated he he is having pain in between his shoulder blades and right side on his breast.   2. Are you experiencing any other symptoms (ex. SOB, nausea, vomiting, sweating)? Stated no other symptoms.   3. Is your CP continuous or coming and going? Comes and goes  4. Have you taken Nitroglycerin ? no  5. How long have you been experiencing CP? Patient stated a couple of weeks, he has been having this pain that comes and goes in between his  shoulder blades.   6. If NO CP at time of call then end call with telling Pt to call back or call 911 if Chest pain returns prior to return call from triage team.  "

## 2024-01-12 NOTE — Telephone Encounter (Signed)
 Patient also sent MyChart message, see Patient Message from today for details.

## 2024-01-20 ENCOUNTER — Telehealth: Payer: Self-pay

## 2024-01-20 NOTE — Telephone Encounter (Signed)
 Call to Dr. Carmin office. Patient states he has seen Dr. Micky recently, states he hasrequested notes be sent to us . Most recent notes on file are from January 2025. Called Dr. Carmin office, left VM asking that most recent notes be sent to our office, fax # provided.

## 2024-01-26 ENCOUNTER — Other Ambulatory Visit: Payer: Self-pay

## 2024-01-26 DIAGNOSIS — G4733 Obstructive sleep apnea (adult) (pediatric): Secondary | ICD-10-CM

## 2024-03-01 ENCOUNTER — Ambulatory Visit: Admitting: Emergency Medicine
